# Patient Record
Sex: Female | Born: 1990 | Race: White | Hispanic: No | Marital: Single | Smoking: Never smoker
Health system: Southern US, Community
[De-identification: ages and names within clinical notes are randomized; demographics above are authoritative.]

## PROBLEM LIST (undated history)

## (undated) DIAGNOSIS — G909 Disorder of the autonomic nervous system, unspecified: Secondary | ICD-10-CM

## (undated) DIAGNOSIS — Z9109 Other allergy status, other than to drugs and biological substances: Secondary | ICD-10-CM

## (undated) DIAGNOSIS — E669 Obesity, unspecified: Secondary | ICD-10-CM

## (undated) DIAGNOSIS — H5213 Myopia, bilateral: Secondary | ICD-10-CM

## (undated) DIAGNOSIS — F419 Anxiety disorder, unspecified: Secondary | ICD-10-CM

## (undated) DIAGNOSIS — E559 Vitamin D deficiency, unspecified: Secondary | ICD-10-CM

## (undated) DIAGNOSIS — K219 Gastro-esophageal reflux disease without esophagitis: Secondary | ICD-10-CM

## (undated) DIAGNOSIS — T7840XA Allergy, unspecified, initial encounter: Secondary | ICD-10-CM

## (undated) DIAGNOSIS — F32A Depression, unspecified: Secondary | ICD-10-CM

## (undated) DIAGNOSIS — F329 Major depressive disorder, single episode, unspecified: Secondary | ICD-10-CM

## (undated) DIAGNOSIS — R55 Syncope and collapse: Secondary | ICD-10-CM

## (undated) DIAGNOSIS — F909 Attention-deficit hyperactivity disorder, unspecified type: Secondary | ICD-10-CM

## (undated) DIAGNOSIS — E538 Deficiency of other specified B group vitamins: Secondary | ICD-10-CM

## (undated) HISTORY — DX: Disorder of the autonomic nervous system, unspecified: G90.9

## (undated) HISTORY — DX: Myopia, bilateral: H52.13

## (undated) HISTORY — DX: Gastro-esophageal reflux disease without esophagitis: K21.9

## (undated) HISTORY — DX: Deficiency of other specified B group vitamins: E53.8

## (undated) HISTORY — DX: Allergy, unspecified, initial encounter: T78.40XA

## (undated) HISTORY — DX: Obesity, unspecified: E66.9

## (undated) HISTORY — DX: Vitamin D deficiency, unspecified: E55.9

## (undated) HISTORY — DX: Syncope and collapse: R55

## (undated) HISTORY — DX: Other allergy status, other than to drugs and biological substances: Z91.09

---
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
## 2011-01-01 ENCOUNTER — Ambulatory Visit (INDEPENDENT_AMBULATORY_CARE_PROVIDER_SITE_OTHER): Payer: 59 | Admitting: Marriage and Family Therapist

## 2011-01-01 DIAGNOSIS — F331 Major depressive disorder, recurrent, moderate: Secondary | ICD-10-CM

## 2011-01-01 DIAGNOSIS — F988 Other specified behavioral and emotional disorders with onset usually occurring in childhood and adolescence: Secondary | ICD-10-CM
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
## 2011-01-06 ENCOUNTER — Ambulatory Visit (INDEPENDENT_AMBULATORY_CARE_PROVIDER_SITE_OTHER): Payer: 59 | Admitting: Marriage and Family Therapist

## 2011-01-06 DIAGNOSIS — F331 Major depressive disorder, recurrent, moderate: Secondary | ICD-10-CM

## 2011-01-06 DIAGNOSIS — F988 Other specified behavioral and emotional disorders with onset usually occurring in childhood and adolescence: Secondary | ICD-10-CM
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
## 2011-01-14 ENCOUNTER — Ambulatory Visit (HOSPITAL_COMMUNITY): Payer: 59 | Admitting: Marriage and Family Therapist

## 2011-01-14 DIAGNOSIS — F339 Major depressive disorder, recurrent, unspecified: Secondary | ICD-10-CM

## 2011-01-14 DIAGNOSIS — F988 Other specified behavioral and emotional disorders with onset usually occurring in childhood and adolescence: Secondary | ICD-10-CM

## 2011-01-14 DIAGNOSIS — F900 Attention-deficit hyperactivity disorder, predominantly inattentive type: Secondary | ICD-10-CM

## 2011-01-14 NOTE — Progress Notes (Signed)
   THERAPIST PROGRESS NOTE  Session Time:  Noon - 1:00 p.m.  Participation Level: Active  Behavioral Response: Well GroomedAlertAnxious  Type of Therapy: Individual Therapy  Treatment Goals addressed: Coping  Interventions: Family Systems  Summary: [name] is a 21 y.o. female who presents with depression, ADD, and family conflict. She was referred by Dr. Ladona Ridgel.  Patient reports that she has been falling into another deep depression due to her brother acting out at home and her parents "not doing anything about it."  She is especially angry at her mother for "not following through with being a parent" (her words). Patient reports she has felt suicidal but with no plan e.g., "I can't take it anymore and things are never going to get better so why try."   She also reports doing the DBT workbook right now has lead to surfacing emotions and memories she is "not ready to deal with."  Patient also talked about wanting to leave her family in order to cope and also she will be starting college next week part-time.    Suicidal/Homicidal: Yeswithout intent/plan  Therapist Response:  Decided to hold off on doing DBT work as long as patient's home life remains unstable.  Will consider doing DBT some time in the future when patient is stronger emotionally and possibly out of the house.  Plan: Return again in 1 weeks.  Diagnosis: Axis I: Major Depressive D/O, moderate; ADHD, w/o hyperactivity    Axis II: Deferred    Mary Hafen, LMFT 01/14/2011

## 2011-01-19 ENCOUNTER — Ambulatory Visit (INDEPENDENT_AMBULATORY_CARE_PROVIDER_SITE_OTHER): Payer: 59 | Admitting: Marriage and Family Therapist

## 2011-01-19 DIAGNOSIS — F900 Attention-deficit hyperactivity disorder, predominantly inattentive type: Secondary | ICD-10-CM

## 2011-01-19 DIAGNOSIS — F988 Other specified behavioral and emotional disorders with onset usually occurring in childhood and adolescence: Secondary | ICD-10-CM

## 2011-01-19 DIAGNOSIS — F330 Major depressive disorder, recurrent, mild: Secondary | ICD-10-CM

## 2011-01-19 NOTE — Progress Notes (Signed)
   THERAPIST PROGRESS NOTE  Session Time:  3:00 - 4:00 p.m.  Participation Level: Active  Behavioral Response: Fairly GroomedAlertDepressed  Type of Therapy: Individual Therapy  Treatment Goals addressed: Coping  Interventions: Solution Focused, Strength-based, Assertiveness Training, Supportive and Family Systems  Summary: [name] is a 21 y.o. female who presents with depression, anxiety, and ADD.  She was referred by Jorje Guild, PA. She reports feeling less depressed and was able to recognize that she got her period after last week's session.  Patient started school today and had a good day.  She also talked about her parents "not being able to be there for her" and the idea that she is "really on her own in many respects."    Suicidal/Homicidal: Nowithout intent/plan  Therapist Response:  Discussed an option of patient getting out of the house by being a live-in nanny as an option.  Also used strength-based therapy i.e., discussed opportunity for patient to let go of the fantasy that her parents were capable of parenting her in the way she needs and family systems e.g., parents are not bad people, they are tired, overwhelmed, and have mental health illnesses).  Plan: Return again in 1 weeks.  Diagnosis: Axis I: Major depressive d/o, moderate, ADD    Axis II: Deferred    Jjesus Dingley, LMFT 01/19/2011

## 2011-01-26 ENCOUNTER — Ambulatory Visit (INDEPENDENT_AMBULATORY_CARE_PROVIDER_SITE_OTHER): Payer: 59 | Admitting: Marriage and Family Therapist

## 2011-01-26 DIAGNOSIS — F988 Other specified behavioral and emotional disorders with onset usually occurring in childhood and adolescence: Secondary | ICD-10-CM

## 2011-01-26 DIAGNOSIS — F330 Major depressive disorder, recurrent, mild: Secondary | ICD-10-CM

## 2011-01-26 DIAGNOSIS — F900 Attention-deficit hyperactivity disorder, predominantly inattentive type: Secondary | ICD-10-CM

## 2011-01-26 NOTE — Progress Notes (Signed)
   THERAPIST PROGRESS NOTE  Session Time:  3:00 - 4:00 p.m.  Participation Level: Active  Behavioral Response: Fairly GroomedAlertDepressed  Type of Therapy: Individual Therapy  Treatment Goals addressed: Coping  Interventions: Strength-based, Supportive and Family Systems  Summary: [name] is a 21 y.o. female who presents with depression ADHD (w/o hyperactivity) and chronic family stress.  She was referred by Dr. Ladona Ridgel.  Spent most of the session completing paperwork for reasonable accommodations in college.  Patient did state she has been depressed "on and off" but did not talk about why.  She reports she is struggling with keeping up in math class due to her inability to pay absorb the information in a timely fashion.  Suicidal/Homicidal: Nowithout intent/plan  Therapist Response:   Completed paperwork, and suggested that she talk to her mother about dropping the math class before family losses the tuition.  Plan: Return again in 1 weeks.  Diagnosis: Axis I: Major Depressive D/O, Moderate; ADHD, w/o anxiety    Axis II: Deferred    Tuesday Terlecki, LMFT 01/26/2011

## 2011-01-28 NOTE — Progress Notes (Signed)
Addended by: Cleophas Dunker on: 01/28/2011 03:25 PM   Modules accepted: Level of Service

## 2011-02-02 ENCOUNTER — Ambulatory Visit (INDEPENDENT_AMBULATORY_CARE_PROVIDER_SITE_OTHER): Payer: 59 | Admitting: Marriage and Family Therapist

## 2011-02-02 DIAGNOSIS — F331 Major depressive disorder, recurrent, moderate: Secondary | ICD-10-CM

## 2011-02-02 DIAGNOSIS — F988 Other specified behavioral and emotional disorders with onset usually occurring in childhood and adolescence: Secondary | ICD-10-CM

## 2011-02-02 DIAGNOSIS — F900 Attention-deficit hyperactivity disorder, predominantly inattentive type: Secondary | ICD-10-CM

## 2011-02-02 NOTE — Progress Notes (Signed)
   THERAPIST PROGRESS NOTE  Session Time:  3:00 - 4:00 p.m.  Participation Level: Active  Behavioral Response: Fairly GroomedAlertAnxious, Depressed and Worthless  Type of Therapy: Individual Therapy  Treatment Goals addressed: Coping  Interventions: Strength-based, Supportive and Family Systems  Summary: [name] is a 21 y.o. female who presents with depressive anxiety and ADHD w/o hyperactivity.  (S)he was referred by Dr. Ladona Ridgel. Patient is reporting physical symptoms she believes could be contributing to her depression.  She reports having problems with her period as of the last two weeks are causing mood swings and while out shopping two weekends in a row with no warning patient began having crying spells.  She reports at the time she was not thinking of anything and that shopping with her mother is pleasurable.  Patient continues with school and having no emotional problems.  Suicidal/Homicidal: Nowithout intent/plan  Therapist Response:  Discussed patient's need to talk to Dr. Toni Arthurs about her symptoms even if she needs to write them down for him.  She reports having a difficult time talking to him (states she does not connect emotionally with him even though he know a lot about medication).  Talked about patient asserting herself with Dr. Toni Arthurs to rule out any additional problem and for him to be able to treat her effectively.  Plan: Return again in 1 weeks.  Diagnosis: Axis I: Major Depressive D/O, Moderate; ADHD w/o hyperactivity    Axis II: Deferred    Shaheim Mahar, LMFT 02/02/2011

## 2011-02-09 ENCOUNTER — Ambulatory Visit (HOSPITAL_COMMUNITY): Payer: 59 | Admitting: Marriage and Family Therapist

## 2011-02-17 ENCOUNTER — Ambulatory Visit (INDEPENDENT_AMBULATORY_CARE_PROVIDER_SITE_OTHER): Payer: 59 | Admitting: Marriage and Family Therapist

## 2011-02-17 DIAGNOSIS — F988 Other specified behavioral and emotional disorders with onset usually occurring in childhood and adolescence: Secondary | ICD-10-CM

## 2011-02-17 DIAGNOSIS — F331 Major depressive disorder, recurrent, moderate: Secondary | ICD-10-CM

## 2011-02-17 NOTE — Progress Notes (Signed)
   THERAPIST PROGRESS NOTE  Session Time:  3:00 - 4:00 p.m.  Participation Level: Active  Behavioral Response: CasualAlertAnxious and Depressed  Type of Therapy: Individual Therapy  Treatment Goals addressed: Coping  Interventions: Strength-based, Supportive and Family Systems  Summary: [name] is a 21 y.o. female who presents with depression, anxiety, and ADHD. She was referred by Dr. Ladona Ridgel.  Although she reports that her moods are "up and down."  Patient continues to talk about her home life stating that her brother is "out of control" concerning his behavior and again she says she is having difficulty coping because he has been so volatile.  She also talked about her parents asking her to do errands with the car "when they want and not when it is a good time for me" due to her mental health symptoms (depression/anxiety).  She did report she was enjoying school and that academically with any difficulty that would arise because of ADHD she has been able to manage it due to the type of teaching that has been provided for her.  She also talked about having a lot of time available and is wanting to get a job to have her own money and to help her parents financially.  Patient also started talking about "what it all means" i.e. What is the purpose of her life, something she has never done in therapy with this Clinical research associate.  Suicidal/Homicidal: Nowithout intent/plan  Therapist Response: Discussed with patient what is appropriate family behavior with a 62 year-old including that she is no longer a child and the need for negotiation in order to help her with what is normal.  Discussed patient's position in the family when it comes to what is happening with her brother (has the least control over the situation).  Also encouraged patient to get a job and discussed the benefits of meeting other people (establishing new friendships, having her own money to gain independence).  Discussed patient looking  to the future as a good sign of less depression even though she reports struggling with mood changes.  Plan: Return again in 1 weeks.  Diagnosis: Axis I: Major Depressive Disorder, Moderate; ADHD    Axis II: Deferred    Trek Kimball, LMFT 02/17/2011

## 2011-02-23 ENCOUNTER — Ambulatory Visit (HOSPITAL_COMMUNITY): Payer: 59 | Admitting: Marriage and Family Therapist

## 2011-03-03 ENCOUNTER — Ambulatory Visit (HOSPITAL_COMMUNITY): Payer: 59 | Admitting: Marriage and Family Therapist

## 2011-03-09 ENCOUNTER — Ambulatory Visit (HOSPITAL_COMMUNITY): Payer: 59 | Admitting: Marriage and Family Therapist

## 2011-03-16 ENCOUNTER — Ambulatory Visit (HOSPITAL_COMMUNITY): Payer: Self-pay | Admitting: Marriage and Family Therapist

## 2011-03-23 ENCOUNTER — Ambulatory Visit (HOSPITAL_COMMUNITY): Payer: Self-pay | Admitting: Marriage and Family Therapist

## 2011-03-30 ENCOUNTER — Ambulatory Visit (HOSPITAL_COMMUNITY): Payer: Self-pay | Admitting: Marriage and Family Therapist

## 2011-04-01 ENCOUNTER — Ambulatory Visit (INDEPENDENT_AMBULATORY_CARE_PROVIDER_SITE_OTHER): Payer: 59 | Admitting: Marriage and Family Therapist

## 2011-04-01 DIAGNOSIS — F988 Other specified behavioral and emotional disorders with onset usually occurring in childhood and adolescence: Secondary | ICD-10-CM

## 2011-04-01 DIAGNOSIS — F331 Major depressive disorder, recurrent, moderate: Secondary | ICD-10-CM

## 2011-04-01 NOTE — Progress Notes (Signed)
   THERAPIST PROGRESS NOTE  Session Time:  3:00 - 4:00 P.M.  Participation Level: Active  Behavioral Response: CasualAlertAnxious and Depressed  Type of Therapy: Individual Therapy  Treatment Goals addressed: Coping  Interventions: Strength-based, Supportive and Family Systems  Summary: [name] is a 21 y.o. female who presents with depression, anxiety, ADHD, and family problems.  She was referred by Dr. Ladona Ridgel.  Patient reports she is actually doing well regarding her mental health symptoms and she reports over the past few weeks things have not been that stressful at home.  She reports her father has not been spending much time at home, that he has been spending more time at the church.  She actually states, "he is abandoning the family for the church" however because he has not been there the conflict at home has been less but that it has put more stress on her mother and that patient has been taking care of her younger brother.  Patient did state she bought a bike and that she has been fixing it up to travel back for forth to school.  She also reports she has A's in her classes and has been thinking of going full-time in the Fall, that she is ready to go full-time and ready emotionally to move back to school. Overall patient states she is feeling good.  Suicidal/Homicidal: Nowithout intent/plan  Therapist Response:  Since patient has not been in for some time took the time to assess patient's mental state and patient's depression has improved.  However, since it has only been about a month and patient's home life has never been stable  and this is unusual will continue to see her weekly.  Plan: Return again in 1 weeks.  Diagnosis: Axis I: Major Depressive D/O, Moderate, ADHD    Axis II: Deferred    Zuriel Roskos, LMFT 04/01/2011

## 2011-04-06 ENCOUNTER — Ambulatory Visit (HOSPITAL_COMMUNITY): Payer: Self-pay | Admitting: Marriage and Family Therapist

## 2011-04-13 ENCOUNTER — Encounter (HOSPITAL_COMMUNITY): Payer: Self-pay | Admitting: *Deleted

## 2011-04-13 ENCOUNTER — Emergency Department (INDEPENDENT_AMBULATORY_CARE_PROVIDER_SITE_OTHER)
Admission: EM | Admit: 2011-04-13 | Discharge: 2011-04-13 | Disposition: A | Discharge status: Home / Self Care | Payer: 59 | Source: Home / Self Care | Attending: Emergency Medicine | Admitting: Emergency Medicine

## 2011-04-13 DIAGNOSIS — J029 Acute pharyngitis, unspecified: Secondary | ICD-10-CM

## 2011-04-13 HISTORY — DX: Anxiety disorder, unspecified: F41.9

## 2011-04-13 HISTORY — DX: Depression, unspecified: F32.A

## 2011-04-13 HISTORY — DX: Attention-deficit hyperactivity disorder, unspecified type: F90.9

## 2011-04-13 HISTORY — DX: Major depressive disorder, single episode, unspecified: F32.9

## 2011-04-13 LAB — POCT RAPID STREP A: Streptococcus, Group A Screen (Direct): NEGATIVE

## 2011-04-13 MED ORDER — ACETAMINOPHEN 500 MG PO TABS: 500.0000 mg | ORAL_TABLET | Freq: Four times a day (QID) | ORAL | Status: AC | PRN

## 2011-04-13 NOTE — Discharge Instructions (Signed)
  As discussed consider the possibility of excessive dryness try to hydrate yourself well today with above 2 L of fluids during the course of 24 hours. Use a humidifier tonight and discontinue any over-the-counter medicines including antihistamines that you're taking.    Sore Throat Sore throats may be caused by bacteria and viruses. They may also be caused by:  Smoking.   Pollution.   Allergies.  If a sore throat is due to strep infection (a bacterial infection), you may need:  A throat swab.   A culture test to verify the strep infection.  You will need one of these:  An antibiotic shot.   Oral medicine for a full 10 days.  Strep infection is very contagious. A doctor should check any close contacts who have a sore throat or fever. A sore throat caused by a virus infection will usually last only 3-4 days. Antibiotics will not treat a viral sore throat.  Infectious mononucleosis (a viral disease), however, can cause a sore throat that lasts for up to 3 weeks. Mononucleosis can be diagnosed with blood tests. You must have been sick for at least 1 week in order for the test to give accurate results. HOME CARE INSTRUCTIONS   To treat a sore throat, take mild pain medicine.   Increase your fluids.   Eat a soft diet.   Do not smoke.   Gargling with warm water or salt water (1 tsp. salt in 8 oz. water) can be helpful.   Try throat sprays or lozenges or sucking on hard candy to ease the symptoms.  Call your doctor if your sore throat lasts longer than 1 week.  SEEK IMMEDIATE MEDICAL CARE IF:  You have difficulty breathing.   You have increased swelling in the throat.   You have pain so severe that you are unable to swallow fluids or your saliva.   You have a severe headache, a high fever, vomiting, or a red rash.  Document Released: 02/05/2004 Document Revised: 12/17/2010 Document Reviewed: 12/15/2006 [facility] Patient Information 2012 [place], [place].

## 2011-04-13 NOTE — ED Notes (Signed)
Pt states she started with a cough 3-4 days ago and now has a "severe" sore throat.  States "I can't even swallow water".  Throat is not red or swollen.  Keeps stating she feels "so tired".  Denies fever or ear pain.  States she has a little nasal congestion.  Also states it feels "like a giant lump is in my throat".

## 2011-04-13 NOTE — ED Provider Notes (Signed)
History     CSN: [id number]  Arrival date & time 04/13/11  4098   First MD Initiated Contact with Patient 04/13/11 [phone]      Chief Complaint  Patient presents with  . Sore Throat    (Consider location/radiation/quality/duration/timing/severity/associated sxs/prior treatment) HPI Comments: Started happening after he started taking some antihistamines have a severe sore throat it hurts swallowing water mild cough with no congestion sore fevers.  Patient is a 21 y.o. female presenting with pharyngitis. The history is provided by the patient.  Sore Throat This is a new problem. The current episode started more than 2 days ago. The problem occurs constantly. The problem has been gradually worsening. Pertinent negatives include no chest pain, no abdominal pain and no shortness of breath. The symptoms are aggravated by swallowing. She has tried nothing for the symptoms. The treatment provided no relief.    Past Medical History  Diagnosis Date  . Depression   . Anxiety   . ADHD (attention deficit hyperactivity disorder)     History reviewed. No pertinent past surgical history.  No family history on file.  History  Substance Use Topics  . Smoking status: Never Smoker   . Smokeless tobacco: Not on file  . Alcohol Use: No    OB History    Grav Para Term Preterm Abortions TAB SAB Ect Mult Living                  Review of Systems  Constitutional: Positive for fatigue. Negative for fever, chills, activity change and appetite change.  HENT: Positive for sore throat. Negative for congestion, rhinorrhea, trouble swallowing and neck stiffness.   Respiratory: Negative for cough and shortness of breath.   Cardiovascular: Negative for chest pain.  Gastrointestinal: Negative for abdominal pain.  Skin: Negative for rash.    Allergies  Review of patient's allergies indicates not on file.  Home Medications   Current Outpatient Rx  Name Route Sig Dispense Refill  . LAMOTRIGINE 100  MG PO TABS Oral Take 100 mg by mouth daily.    Marland Kitchen LISDEXAMFETAMINE DIMESYLATE 70 MG PO CAPS Oral Take 70 mg by mouth every morning.    Marland Kitchen MIRTAZAPINE 30 MG PO TABS Oral Take 30 mg by mouth at bedtime.    Marland Kitchen MONONESSA PO Oral Take by mouth.    . SERTRALINE HCL 100 MG PO TABS Oral Take 100 mg by mouth daily.      BP 127/83  Pulse 93  Temp(Src) 98 F (36.7 C) (Oral)  Resp 18  SpO2 98%  Physical Exam  Nursing note and vitals reviewed. Constitutional: She appears well-developed and well-nourished.  Non-toxic appearance. She does not have a sickly appearance. She does not appear ill. No distress.  Eyes: Conjunctivae are normal.  Neck: Normal range of motion. Neck supple.  Cardiovascular: Normal rate.   Pulmonary/Chest: Effort normal and breath sounds normal.  Lymphadenopathy:    She has no cervical adenopathy.  Skin: No rash noted.    ED Course  Procedures (including critical care time)   Labs Reviewed  POCT RAPID STREP A ([facility] URG CARE ONLY)   No results found.   No diagnosis found.    MDM  Sore throat for 3 days with cough. Exam was unremarkable: No erythema, no swelling, no fevers. Decided to screen patient for strep to determine if she is a carrier more than considering active streptococcal pharyngitis. Encouraged patient to manage symptoms with Tylenol or Motrin  Jimmie Molly, MD 04/13/11 [phone]

## 2011-04-15 ENCOUNTER — Ambulatory Visit (HOSPITAL_COMMUNITY): Payer: Self-pay | Admitting: Marriage and Family Therapist

## 2011-04-22 ENCOUNTER — Ambulatory Visit (INDEPENDENT_AMBULATORY_CARE_PROVIDER_SITE_OTHER): Payer: 59 | Admitting: Marriage and Family Therapist

## 2011-04-22 DIAGNOSIS — F331 Major depressive disorder, recurrent, moderate: Secondary | ICD-10-CM

## 2011-04-22 DIAGNOSIS — F988 Other specified behavioral and emotional disorders with onset usually occurring in childhood and adolescence: Secondary | ICD-10-CM

## 2011-04-22 NOTE — Progress Notes (Signed)
   THERAPIST PROGRESS NOTE  Session Time: 3:00-4:00 p.m.  Participation Level: Active  Behavioral Response: CasualAlertDepressed  Type of Therapy: Individual Therapy  Treatment Goals addressed: Coping  Interventions: Strength-based, Supportive and Family Systems  Summary: Rebekka Lobello is a 21 y.o. female who presents with depression and family/school stress.  She was referred by Dr. Ladona Ridgel.  Patient was for the first time in long time calm and happy.  She reports she is mainly focused on getting her projects done in her classes at this time.  She reports that home life has been calm and there was not much to report regarding any situations where she was experiencing stress.    Suicidal/Homicidal: Nowithout intent/plan  Therapist Response:  Discussed with patient the idea that she could consider focusing on living her life in a "non-crisis" manner by reframing the way she thinks to point out to patient that there is another way of thinking (patient tends to be crisis oriented).  Discussed patient's future re school and beginning college full-time since she is doing so well in school.  Plan: Return again in 1 weeks.  Diagnosis: Axis I: Major Depressive D/O, Moderate, ADHD, w/o hyperactivity    Axis II: Deferred    Elai Vanwyk, LMFT 04/22/2011

## 2011-05-20 ENCOUNTER — Ambulatory Visit (INDEPENDENT_AMBULATORY_CARE_PROVIDER_SITE_OTHER): Payer: 59 | Admitting: Marriage and Family Therapist

## 2011-05-20 DIAGNOSIS — F331 Major depressive disorder, recurrent, moderate: Secondary | ICD-10-CM

## 2011-05-20 NOTE — Progress Notes (Signed)
   THERAPIST PROGRESS NOTE  Session Time:  3:00 - 4:00 p.m.  Participation Level: Active  Behavioral Response: CasualAlertDepressed  Type of Therapy: Individual Therapy  Treatment Goals addressed: Coping  Interventions: Strength-based, Assertiveness Training, Supportive and Family Systems  Summary: [name] is a 21 y.o. female who presents with depression, ADD, and family chaos.  She was referred by Dr. Ladona Ridgel.  Patient reports she completed school with all A's.  She reports believing that she will be able to attend [employer] in the Fall.  Patient states that she and her family went out to dinner to celebrate her achievement and her father got angry "over bad bread," left the restaurant, then her mother followed, leaving patient and her brother in the restaurant to finish dinner alone.  Patient stated she cried in the restaurant.  She reports that the family will be going to [place] in June to see her grandparents and she is looking forward to seeing them because they are ill and "I may not get to see them again."  She is not taking Summer classes because of the trip.  Patient states that she is "doing okay."   Suicidal/Homicidal: Nowithout intent/plan  Therapist Response:  Supported and discussed with patient again regarding what is healthy family behavior and moving forward with college.   Plan: Return again in 1 weeks.  Diagnosis: Axis I: Major depressive disorder; ADHD    Axis II: Deferred    Cruz Devilla, LMFT 05/20/2011

## 2011-05-25 ENCOUNTER — Ambulatory Visit (INDEPENDENT_AMBULATORY_CARE_PROVIDER_SITE_OTHER): Payer: 59 | Admitting: Marriage and Family Therapist

## 2011-05-25 DIAGNOSIS — F331 Major depressive disorder, recurrent, moderate: Secondary | ICD-10-CM

## 2011-05-25 DIAGNOSIS — F988 Other specified behavioral and emotional disorders with onset usually occurring in childhood and adolescence: Secondary | ICD-10-CM

## 2011-05-25 NOTE — Progress Notes (Signed)
   THERAPIST PROGRESS NOTE  Session Time:  3:00 - 4:00 p.m.  Participation Level: Active  Behavioral Response: CasualAlertDepressed  Type of Therapy: Individual Therapy  Treatment Goals addressed: Coping  Interventions: Strength-based, Assertiveness Training, Supportive and Family Systems  Summary: [name] is a 21 y.o. female who presents with major depression and ADD.  She was referred by Dr. Ladona Ridgel.  Patient reports that she and her family are going to her grandparent's house in two weeks.  She talked about never wanting to be in a relationship or have children based on how she views relationships due to her parents' relationship and "the behavior I see in men" (that they can get violent - her words).  She reports she wants to focus on succeeding professionally rather than letting relationships get in the way.  Suicidal/Homicidal: Nowithout intent/plan  Therapist Response:  This is the first time patient has spoken about the future in the context of relationships and being successful professionally and how to balance both.  Plan: Return again in 1 weeks.  Diagnosis: Axis I: Major Depressive D/O; ADHD    Axis II: Deferred    Mackensie Pilson, LMFT 05/25/2011

## 2011-06-01 ENCOUNTER — Ambulatory Visit (INDEPENDENT_AMBULATORY_CARE_PROVIDER_SITE_OTHER): Payer: 59 | Admitting: Marriage and Family Therapist

## 2011-06-01 DIAGNOSIS — F331 Major depressive disorder, recurrent, moderate: Secondary | ICD-10-CM

## 2011-06-01 DIAGNOSIS — F988 Other specified behavioral and emotional disorders with onset usually occurring in childhood and adolescence: Secondary | ICD-10-CM
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
## 2011-06-08 ENCOUNTER — Ambulatory Visit (HOSPITAL_COMMUNITY): Payer: Self-pay | Admitting: Marriage and Family Therapist

## 2011-06-15 ENCOUNTER — Ambulatory Visit (HOSPITAL_COMMUNITY): Payer: Self-pay | Admitting: Marriage and Family Therapist

## 2011-06-15 NOTE — Progress Notes (Unsigned)
   THERAPIST PROGRESS NOTE  Session Time:  3:00 - 4:00 p.m.  Participation Level: Patient did not show for her appointment.  Left a message on her voicemail.  Behavioral Response:   Type of Therapy: Individual Therapy  Treatment Goals addressed:   Interventions:   Summary: [name] is a 21 y.o. female who presents with depression and ADD.  She was referred by Dr. Ladona Ridgel.  Patient did not show for her appointment.  Suicidal/Homicidal:   Therapist Response:   Plan: Return again in 1 weeks.  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

## 2011-06-22 ENCOUNTER — Ambulatory Visit (HOSPITAL_COMMUNITY): Payer: Self-pay | Admitting: Marriage and Family Therapist

## 2011-06-29 ENCOUNTER — Ambulatory Visit (HOSPITAL_COMMUNITY): Payer: Self-pay | Admitting: Marriage and Family Therapist

## 2011-07-06 ENCOUNTER — Ambulatory Visit (HOSPITAL_COMMUNITY): Payer: Self-pay | Admitting: Marriage and Family Therapist

## 2011-07-06 NOTE — Progress Notes (Unsigned)
   THERAPIST PROGRESS NOTE  Session Time:  3:00 - 4:00 p.m.  Participation Level: None - patient did not show for her appointment.  Behavioral Response: N/S  Type of Therapy: Individual Therapy  Treatment Goals addressed: None - patient did not show for appointment  Interventions:   Summary: [name] is a 21 y.o. female who presents with depression and ADD.  She was referred by Dr. Ladona Ridgel.     Suicidal/Homicidal:   Therapist Response: Called and left message for patient.  Patient will be charged for this appointment.  A letter will be sent.  Plan: Return again in 1 weeks.  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

## 2011-07-07 ENCOUNTER — Encounter (HOSPITAL_COMMUNITY): Payer: Self-pay

## 2011-07-08 ENCOUNTER — Telehealth (HOSPITAL_COMMUNITY): Payer: Self-pay | Admitting: Marriage and Family Therapist

## 2011-07-13 ENCOUNTER — Ambulatory Visit (INDEPENDENT_AMBULATORY_CARE_PROVIDER_SITE_OTHER): Payer: 59 | Admitting: Marriage and Family Therapist

## 2011-07-13 DIAGNOSIS — F988 Other specified behavioral and emotional disorders with onset usually occurring in childhood and adolescence: Secondary | ICD-10-CM

## 2011-07-13 DIAGNOSIS — F331 Major depressive disorder, recurrent, moderate: Secondary | ICD-10-CM
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
## 2011-07-20 ENCOUNTER — Ambulatory Visit (INDEPENDENT_AMBULATORY_CARE_PROVIDER_SITE_OTHER): Payer: 59 | Admitting: Marriage and Family Therapist

## 2011-07-20 DIAGNOSIS — F988 Other specified behavioral and emotional disorders with onset usually occurring in childhood and adolescence: Secondary | ICD-10-CM

## 2011-07-20 DIAGNOSIS — F331 Major depressive disorder, recurrent, moderate: Secondary | ICD-10-CM

## 2011-07-20 NOTE — Progress Notes (Signed)
   THERAPIST PROGRESS NOTE  Session Time:  3:00 - 4:00 p.m.  Participation Level: Active  Behavioral Response: CasualAlertDepressed  Type of Therapy: Individual Therapy  Treatment Goals addressed: Coping  Interventions: Strength-based and Supportive  Summary: [name] is a 21 y.o. female who presents with depression and ADD.  She was referred by Dr. Ladona Ridgel.   Patient reports she is set up to start college full-time in the Fall at [employer].  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  She reports she is "tired of her life being on hold" and wants to move on and move out of her parents' home.  Patient reports she has gained a substantial amount of weight being home "doing nothing" from 115 lbs to 180 lbs.  She reports she is going to the gym now swimming to try to lose the weight.  She is concerned that her medications may also be causing weight gain.  Patient currently is on Remeron, Zoloft, Lamictal, birth control pills, Vivance and Lorazepam (as needed).  Patient states she believes she will be on medication the rest of her life due to family history and her own "chemistry."  Discussed what happiness means to patient since she was talking about her older brother leaving home, going to chiropractic school and getting married.  Suicidal/Homicidal: Nowithout intent/plan  Therapist Response:  Reviewed patient's plans to go back to school to see if she had a plan and to ask her if she needed assistance relating to any disability requirements.  Also reviewed patient's medication list.  Asked patient if she has ever been happy to identify if patient could draw from moments of happiness to help alleviate her depression and have more hope about her future.  Patient reported she had moments of happiness, could not identify the circumstances, but stated those moments were fleeting and that she did not expect to be happy.  Will continue to address patient's plans for the future  encouraging her to continue to focus on same.  Plan: Return again in 1 weeks.  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

## 2011-08-03 ENCOUNTER — Ambulatory Visit (INDEPENDENT_AMBULATORY_CARE_PROVIDER_SITE_OTHER): Payer: 59 | Admitting: Marriage and Family Therapist

## 2011-08-03 DIAGNOSIS — F331 Major depressive disorder, recurrent, moderate: Secondary | ICD-10-CM

## 2011-08-03 DIAGNOSIS — F988 Other specified behavioral and emotional disorders with onset usually occurring in childhood and adolescence: Secondary | ICD-10-CM

## 2011-08-03 NOTE — Progress Notes (Signed)
   THERAPIST PROGRESS NOTE  Session Time:  3:00 - 4:00 p.m.  Participation Level: Active  Behavioral Response: CasualAlertDepressed  Type of Therapy: Individual Therapy  Treatment Goals addressed: Coping  Interventions: Biofeedback, Supportive and Family Systems  Summary: [name] is a 21 y.o. female who presents with depression and ADHD without hyperactivity.  She was referred by Dr. Ladona Ridgel.  Patient reports the past two weeks have been better. She was smiling and upbeat during the session. She reports spending time with her father picking blueberries and going to the movies.  Patient talked about spending time with her father. She reports being prepared to go to school although she is still working on her Therapist, sports.  She reports that eventually she does want to stay at school and move out of her house because "it is time for me to grow up."  She reports having some depression over the past two weeks but believes it is due to school financial difficulties and the fact that she has not been able to spend time with mother window shopping.  Patient states that doing this activity is a strong mental health technique to help alleviate her depression and when she is unable to do it as part of her ritual she notices a dip in her depression.    Suicidal/Homicidal: Nowithout intent/plan  Therapist Response:  Used strength-based therapy to help patient notice that her father can be a good father but that he has a mental health condition versus believing that he is a bad father.  Also discussed and gave patient credit for developing techniques she uses to decrease her depression including window shopping and having soothing materials in her bedroom, something she has done since she was a child.  Plan: Return again in 1 weeks.  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

## 2011-08-10 ENCOUNTER — Ambulatory Visit (INDEPENDENT_AMBULATORY_CARE_PROVIDER_SITE_OTHER): Payer: 59 | Admitting: Marriage and Family Therapist

## 2011-08-10 DIAGNOSIS — F331 Major depressive disorder, recurrent, moderate: Secondary | ICD-10-CM

## 2011-08-10 DIAGNOSIS — F988 Other specified behavioral and emotional disorders with onset usually occurring in childhood and adolescence: Secondary | ICD-10-CM
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
## 2011-08-18 ENCOUNTER — Ambulatory Visit (INDEPENDENT_AMBULATORY_CARE_PROVIDER_SITE_OTHER): Payer: 59 | Admitting: Marriage and Family Therapist

## 2011-08-18 DIAGNOSIS — F331 Major depressive disorder, recurrent, moderate: Secondary | ICD-10-CM

## 2011-08-18 DIAGNOSIS — F988 Other specified behavioral and emotional disorders with onset usually occurring in childhood and adolescence: Secondary | ICD-10-CM
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
## 2011-08-25 ENCOUNTER — Ambulatory Visit (INDEPENDENT_AMBULATORY_CARE_PROVIDER_SITE_OTHER): Payer: 59 | Admitting: Marriage and Family Therapist

## 2011-08-25 DIAGNOSIS — F988 Other specified behavioral and emotional disorders with onset usually occurring in childhood and adolescence: Secondary | ICD-10-CM

## 2011-08-25 DIAGNOSIS — F331 Major depressive disorder, recurrent, moderate: Secondary | ICD-10-CM

## 2011-08-25 NOTE — Progress Notes (Signed)
   THERAPIST PROGRESS NOTE  Session Time:  3:00 - 4:00 p.m.  Participation Level: Active  Behavioral Response: CasualAlertAnxious  Type of Therapy: Individual Therapy  Treatment Goals addressed: Coping  Interventions: Strength-based, Assertiveness Training, Supportive and Family Systems  Summary: [name] is a 21 y.o. female who presents with depression and ADHD without hyperactivity.  Patient will be starting full-time college on 08/30/11.  She reports this is what she is focused on and is trying to get through the next few days and not get overwhelmed with anxiety and depression before she starts school.  She reports she is tired because she has been on a "awake at night and sleeping during the day" for months and is trying to switch her sleeping schedule.  She reports her parents have been "on her case" about getting her school books, etc.     Suicidal/Homicidal: Nowithout intent/plan  Therapist Response:  Encouraged patient that she was right to focus on school at this time and not to get defocused by others.  Also talked about why her parents are "getting on her case" and patient was able to admit that she "had been lazy."  Also processed the idea that her parents may be anxious because of fear that she will not be able to go full time based on what happened last year at [employer].  Also talked about how to proceed with therapy scheduling and decided to continue weekly appointments and if the appointments become too much will cut down to every other week due to her school course load.  Plan: Return again in 1 weeks.  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

## 2011-08-31 ENCOUNTER — Ambulatory Visit (INDEPENDENT_AMBULATORY_CARE_PROVIDER_SITE_OTHER): Payer: 59 | Admitting: Marriage and Family Therapist

## 2011-08-31 DIAGNOSIS — F331 Major depressive disorder, recurrent, moderate: Secondary | ICD-10-CM

## 2011-08-31 DIAGNOSIS — F988 Other specified behavioral and emotional disorders with onset usually occurring in childhood and adolescence: Secondary | ICD-10-CM
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
## 2011-09-09 ENCOUNTER — Ambulatory Visit (INDEPENDENT_AMBULATORY_CARE_PROVIDER_SITE_OTHER): Payer: 59 | Admitting: Marriage and Family Therapist

## 2011-09-09 DIAGNOSIS — F331 Major depressive disorder, recurrent, moderate: Secondary | ICD-10-CM

## 2011-09-09 DIAGNOSIS — F988 Other specified behavioral and emotional disorders with onset usually occurring in childhood and adolescence: Secondary | ICD-10-CM

## 2011-09-09 NOTE — Progress Notes (Signed)
   THERAPIST PROGRESS NOTE  Session Time:  3:00 - 4:00 p.m.  Participation Level: Active  Behavioral Response: CasualAlertAnxious  Type of Therapy: Individual Therapy  Treatment Goals addressed: Coping  Interventions: Strength-based and Supportive  Summary: [name] is a 21 y.o. 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  She was referred by Dr. Ladona Ridgel.  Patient reports she started school and is "very overwhelmed."  She reports also that due to her not having a schedule and sleeping during the day and being up all night for almost a year she is having difficulty with sleep.  She talked about the classes and some of her classes are not structured in a way that help her (due to her ADHD problems). Patient also reports that four days ago she found out her grandmother (mother's mother) has cardiac pulmonary edema.  She reports remembering last year when her father's mother died in the beginning of her first year at [employer] which was part of why she had so much difficulty the first year.  Suicidal/Homicidal: Nowithout intent/plan  Therapist Response:  Discussed having a meeting with the disabilities department so patient can talk about ways she can be accommodated in the classes that are less structured.  Commended patient that although she is tired and overwhelmed she is not "emotionally frozen" as she was last years.  Discussed the alternative to her quitting would be that she would live at home the rest of her life.  Patient did not like this comment.  Patient will be obtaining software that will help her record information in class and will talk to the disabilities department about alternatives in learning.    Plan: Return again in  1 weeks.  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

## 2011-09-21 ENCOUNTER — Ambulatory Visit (INDEPENDENT_AMBULATORY_CARE_PROVIDER_SITE_OTHER): Payer: 59 | Admitting: Marriage and Family Therapist

## 2011-09-21 DIAGNOSIS — F988 Other specified behavioral and emotional disorders with onset usually occurring in childhood and adolescence: Secondary | ICD-10-CM

## 2011-09-21 DIAGNOSIS — F331 Major depressive disorder, recurrent, moderate: Secondary | ICD-10-CM

## 2011-09-21 NOTE — Progress Notes (Signed)
   THERAPIST PROGRESS NOTE  Session Time:  3:00 - 4:00 p.m.  Participation Level: Active  Behavioral Response: CasualAlertDepressedOverwhelmed  Type of Therapy: Individual Therapy  Treatment Goals addressed: Coping  Interventions: Strength-based and Supportive  Summary: [name] is a 21 y.o. female who presents with depression and ADHD.  She was referred by Dr. Ladona Ridgel.  Patient reports things at home have been calmer.  She reports her brother is happier and not picking on her because he seems to be happy going to [place].  She reports spending time with him reinforcing good behavior by taking him to lunch.  She did state however that she believes she is depressed having to do with school.  She reports feeling overwhelmed, that she is having difficulty assimilating information enough to do her homework.  She reports doing no homework since starting two weeks ago.  She reports feeling exhausted as a result as well.  Patient states she is afraid she is going to end up as she was in [employer].  Suicidal/Homicidal: Nowithout intent/plan  Therapist Response:  Encouraged patient to do school work by coming up with a planned schedule that would include balancing out eating well and getting enough sleep.  Discussed the need for her to "get it together" asap because she will again have to pay back school loans if she fails since the loans are now in her name.  Discussed patient's pattern of coping in a negative way when she gets overwhelmed.  Also suggested she talk to Dr. Toni Arthurs about her medication based on how she is having difficulty thinking but patient stated she believes medication will not help her.  Plan: Return again in 1 weeks.  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

## 2011-09-30 ENCOUNTER — Ambulatory Visit (INDEPENDENT_AMBULATORY_CARE_PROVIDER_SITE_OTHER): Payer: 59 | Admitting: Marriage and Family Therapist

## 2011-09-30 DIAGNOSIS — F988 Other specified behavioral and emotional disorders with onset usually occurring in childhood and adolescence: Secondary | ICD-10-CM

## 2011-09-30 DIAGNOSIS — F331 Major depressive disorder, recurrent, moderate: Secondary | ICD-10-CM

## 2011-09-30 NOTE — Progress Notes (Signed)
   THERAPIST PROGRESS NOTE  Session Time:  3:00 - 4:00 p.m.  Participation Level: Active  Behavioral Response: CasualAlertDepressed  Type of Therapy: Individual Therapy  Treatment Goals addressed: Coping  Interventions: Strength-based, Assertiveness Training, Supportive and Family Systems  Summary: [name] is a 21 y.o. 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  Patient was referred by Dr. Ladona Ridgel.  Patient reports she dropped one of her classes due to being so overwhelmed.  She reports her mother was supportive and told her she was an adult and could make the decision on her own.  Patient reports this made her feel good.  Patient reports despite dropping a class she continues to have difficulty concentrating "the most severe I've had," and started crying.  She reports she wants to go to college and succeed "very badly," but is afraid she is not going to be able to complete college and cannot imagine her doing anything else.  She also reported her father has been berating her for dropping the class but also for having the car at school because he needs it.  She reports she needs to spend her time in school in class and studying because it is too difficult for her to study at home.  Patient states, "I don't know what is the matter with me."  She also reports she does not have a rapport with her psychiatrist and "only goes to him for prescription refills."  She also reports she believes her ADHD medications have not been working for a long time.  She reports she has thought about getting another psychiatrist for a long time.   Suicidal/Homicidal: Nowithout intent/plan  Therapist Response:  Patient is very motivated to get through college but appeared to be having a great deal of difficulty focusing.  She is also getting more and more depressed over school.  Discussed rapport with a doctor not being as important as believing you are taking the right medications.  Suggested she  talk to her mother about how she feels about her psychiatrist and her desire to change and patient stated she would talk to mother.  Will discuss next week.  Plan: Return again in 1 weeks.  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

## 2011-10-05 ENCOUNTER — Ambulatory Visit (INDEPENDENT_AMBULATORY_CARE_PROVIDER_SITE_OTHER): Payer: 59 | Admitting: Marriage and Family Therapist

## 2011-10-05 DIAGNOSIS — F331 Major depressive disorder, recurrent, moderate: Secondary | ICD-10-CM

## 2011-10-05 DIAGNOSIS — F988 Other specified behavioral and emotional disorders with onset usually occurring in childhood and adolescence: Secondary | ICD-10-CM
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
## 2011-10-12 ENCOUNTER — Ambulatory Visit (HOSPITAL_COMMUNITY): Payer: Self-pay | Admitting: Marriage and Family Therapist

## 2011-10-19 ENCOUNTER — Ambulatory Visit (INDEPENDENT_AMBULATORY_CARE_PROVIDER_SITE_OTHER): Payer: 59 | Admitting: Marriage and Family Therapist

## 2011-10-19 DIAGNOSIS — F988 Other specified behavioral and emotional disorders with onset usually occurring in childhood and adolescence: Secondary | ICD-10-CM

## 2011-10-19 DIAGNOSIS — F331 Major depressive disorder, recurrent, moderate: Secondary | ICD-10-CM
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
## 2011-10-26 ENCOUNTER — Ambulatory Visit (INDEPENDENT_AMBULATORY_CARE_PROVIDER_SITE_OTHER): Payer: 59 | Admitting: Marriage and Family Therapist

## 2011-10-26 DIAGNOSIS — F331 Major depressive disorder, recurrent, moderate: Secondary | ICD-10-CM

## 2011-10-26 DIAGNOSIS — F988 Other specified behavioral and emotional disorders with onset usually occurring in childhood and adolescence: Secondary | ICD-10-CM

## 2011-10-27 NOTE — Progress Notes (Signed)
   THERAPIST PROGRESS NOTE  Session Time:  3:00 - 4:00 p.m.  Participation Level: Active  Behavioral Response: CasualAlertDepressed  Type of Therapy: Individual Therapy  Treatment Goals addressed: Coping  Interventions: Strength-based, Supportive and Family Systems  Summary: [name] is a 21 y.o. female who presents with depression and ADHD.  Patient was referred by Dr. Ladona Ridgel.  Patient reports she is noticing more and more how lonely she is.  She reports she has even been avoiding her on-line friends.  She reports at home she does not feel that the focus is on her "most of the time," that the boys in the family are more important, that her father focuses on church and her mother focuses on work and most of the time does not feel well.  Patient states at this time she does not know why she should even try.  She talked about the other students at school and believes "they are all jerks" and she is not interested in getting to know any of them.  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  Suicidal/Homicidal: Nowithout intent/plan  Therapist Response:  Patient appeared sad today based on her body language (appeared tearful) and self-report.  Discussed patient viewing others, especially in school with black and white thinking, causing much of her loneliness and decision not to reach out to others.  Also decided to take most of the session to talk about patient's hopes and dreams.  Explained to patient that even though she is far from obtaining her degree, it would be helpful for her to find ways to keep her passion sparked.  By the end of the session with the discussions about nature and marine biology, patient stated she was feeling a lot better.  The recommendation was that she continue to find others and ways to keep her passion alive while she is in school.  Plan: Return again in 1  weeks.  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

## 2011-11-02 ENCOUNTER — Ambulatory Visit (HOSPITAL_COMMUNITY): Payer: Self-pay | Admitting: Marriage and Family Therapist

## 2011-11-05 ENCOUNTER — Ambulatory Visit (HOSPITAL_COMMUNITY): Payer: Self-pay | Admitting: Marriage and Family Therapist

## 2011-11-09 ENCOUNTER — Ambulatory Visit (INDEPENDENT_AMBULATORY_CARE_PROVIDER_SITE_OTHER): Payer: 59 | Admitting: Marriage and Family Therapist

## 2011-11-09 DIAGNOSIS — F331 Major depressive disorder, recurrent, moderate: Secondary | ICD-10-CM

## 2011-11-09 DIAGNOSIS — F988 Other specified behavioral and emotional disorders with onset usually occurring in childhood and adolescence: Secondary | ICD-10-CM
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
## 2011-11-25 ENCOUNTER — Ambulatory Visit (INDEPENDENT_AMBULATORY_CARE_PROVIDER_SITE_OTHER): Payer: 59 | Admitting: Marriage and Family Therapist

## 2011-11-25 DIAGNOSIS — F331 Major depressive disorder, recurrent, moderate: Secondary | ICD-10-CM

## 2011-11-25 DIAGNOSIS — F988 Other specified behavioral and emotional disorders with onset usually occurring in childhood and adolescence: Secondary | ICD-10-CM

## 2011-11-25 NOTE — Progress Notes (Signed)
   THERAPIST PROGRESS NOTE  Session Time:  1:00 - 2:00 p.m.  Participation Level: Active  Behavioral Response: CasualAlertDepressedMild  Type of Therapy: Individual Therapy  Treatment Goals addressed: Coping  Interventions: Strength-based, Supportive and Family Systems  Summary: [name] is a 21 y.o. female who presents with depression and ADHD.  She was referred by Dr. Ladona Ridgel.  She reports over the past two weeks she has felt less depressed.  She reports it is due to doing so well at school.  Patient's grades are excellent.  She reports she has more hope because she is doing so well but frustrated at school taking so long due to her depression and ADHD.  Patient spent part of the session talking about her plans to go to a school in [place] that specializes in marine biology.  She also reports she and her mother are getting the house ready for her older brother and his fiance and is having a lot of fun fixing the house.  Suicidal/Homicidal: Nowithout intent/plan  Therapist Response:  Encouraged patient to continue to focus on how she is doing in school, and to take that feeling with her into the next semester to help her understand that even if she gets overwhelmed she ends up doing exceptionally well.  Suggested that it will take her about a year of successes to feel more confident taking a full caseload.  The entire session with the exception of what her mother and her are doing in the house was about patient's plans.  Plan: Return again in 1 weeks.  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

## 2011-11-30 ENCOUNTER — Ambulatory Visit (INDEPENDENT_AMBULATORY_CARE_PROVIDER_SITE_OTHER): Payer: 59 | Admitting: Marriage and Family Therapist

## 2011-11-30 DIAGNOSIS — F331 Major depressive disorder, recurrent, moderate: Secondary | ICD-10-CM

## 2011-11-30 DIAGNOSIS — F988 Other specified behavioral and emotional disorders with onset usually occurring in childhood and adolescence: Secondary | ICD-10-CM
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
## 2011-12-07 ENCOUNTER — Ambulatory Visit (INDEPENDENT_AMBULATORY_CARE_PROVIDER_SITE_OTHER): Payer: 59 | Admitting: Marriage and Family Therapist

## 2011-12-07 DIAGNOSIS — F331 Major depressive disorder, recurrent, moderate: Secondary | ICD-10-CM

## 2011-12-07 DIAGNOSIS — F988 Other specified behavioral and emotional disorders with onset usually occurring in childhood and adolescence: Secondary | ICD-10-CM
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
## 2011-12-14 ENCOUNTER — Ambulatory Visit (INDEPENDENT_AMBULATORY_CARE_PROVIDER_SITE_OTHER): Payer: 59 | Admitting: Marriage and Family Therapist

## 2011-12-14 DIAGNOSIS — F988 Other specified behavioral and emotional disorders with onset usually occurring in childhood and adolescence: Secondary | ICD-10-CM

## 2011-12-14 DIAGNOSIS — F331 Major depressive disorder, recurrent, moderate: Secondary | ICD-10-CM

## 2011-12-14 NOTE — Progress Notes (Signed)
   THERAPIST PROGRESS NOTE  Session Time:  3:00 - 4:00 p.m.  Participation Level: Active  Behavioral Response: CasualAlertDepressed (mild)  Type of Therapy: Individual Therapy  Treatment Goals addressed: Coping  Interventions: Strength-based and Supportive  Summary: [name] is a 21 y.o. female who presents with depression and ADHD.  She was referred by Dr. Ladona Ridgel.  Patient reports there is not much going on right now because she is not in school.  She reports feeling some depression centered about applying for next semester's classes.  She reports for some reason she is procrastinating signing up for classes but does not know why.  She blamed it on her ADHD that she is having difficulty focusing.  Patient did say she was thinking about getting a job instead of going to school.  She talked about continuing to work on the house with her family for the visit from her brother and fiance.  She reports this work is taking up a great deal of her time and that she is focused on same.  Suicidal/Homicidal: Nowithout intent/plan  Therapist Response:  Asked patient if she is sabotaging going back to school unconsciously and that this behavior is not like her.  She was not able to identify why she was avoiding signing up for classes even though this semester she did very well.  Overall at this time patient appears to have mild depression evidenced by self-report but also situational having to do with school.  Plan: Return again in 1 weeks.  Diagnosis: Axis I: Major depressive d/o moderate; ADHD without hyperactivity    Axis II: Deferred    Tymeir Weathington, LMFT 12/14/2011

## 2011-12-20 ENCOUNTER — Telehealth (HOSPITAL_COMMUNITY): Payer: Self-pay

## 2011-12-20 NOTE — Telephone Encounter (Signed)
11:10AM 12/20/11 CALLED DUE  PHONE TREE REPORT OPT 2 TO CANCEL - IT WAS AN ERROR./SH

## 2011-12-21 ENCOUNTER — Ambulatory Visit (INDEPENDENT_AMBULATORY_CARE_PROVIDER_SITE_OTHER): Payer: 59 | Admitting: Marriage and Family Therapist

## 2011-12-21 DIAGNOSIS — F988 Other specified behavioral and emotional disorders with onset usually occurring in childhood and adolescence: Secondary | ICD-10-CM

## 2011-12-21 DIAGNOSIS — F331 Major depressive disorder, recurrent, moderate: Secondary | ICD-10-CM

## 2011-12-21 NOTE — Progress Notes (Signed)
   THERAPIST PROGRESS NOTE  Session Time:  3:00 - 4:00 p.m.  Participation Level: Active  Behavioral Response: CasualAlertDepressed (mild)  Type of Therapy: Individual Therapy  Treatment Goals addressed: Coping  Interventions: Strength-based, Supportive and Family Systems  Summary: [name] is a 21 y.o. female who presents with depression and ADHD.  She was referred by Dr. Ladona Ridgel.  Patient reports she was actually feeling less depressed.  She reports this is due to her finishing school, finishing her last exams, and being happy (her words) because she believes she did "very well" on her exams.  Patient was also able to sign up for two classes, chemistry and english.  She reports she is taking english with another teacher contrary to the one she had this semester (that she did not do well with the teacher's "rigidity" and teaching style).  Patient also talked about her father not being around much during the day for a long time because he is so involved in the church.  Patient states she almost did not make her appointment on time due to his being at church.  Patient states she is an atheist because she "got religion rammed down her throat" in early childhood when she went to a [employer] school.  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  Patient reports she is worried about her mother's health (is having bowel problems) due to her mother's stress.  Suicidal/Homicidal: Nowithout intent/plan  Therapist Response:  Commended patient for not signing up for too many classes, something she has done in the past and ended up dropping classes.  Discussed the idea that patient did not have to cope with work by [employer] or "working all the time."  She was able through this discussion realize she is getting better with balancing out classes with down time, "better than when  she was in high school."    Plan: Return again in 1 weeks.  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

## 2011-12-28 ENCOUNTER — Ambulatory Visit (INDEPENDENT_AMBULATORY_CARE_PROVIDER_SITE_OTHER): Payer: 59 | Admitting: Marriage and Family Therapist

## 2011-12-28 DIAGNOSIS — F331 Major depressive disorder, recurrent, moderate: Secondary | ICD-10-CM

## 2011-12-28 DIAGNOSIS — F988 Other specified behavioral and emotional disorders with onset usually occurring in childhood and adolescence: Secondary | ICD-10-CM
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
## 2012-01-11 ENCOUNTER — Ambulatory Visit (HOSPITAL_COMMUNITY): Payer: Self-pay | Admitting: Marriage and Family Therapist

## 2012-01-13 NOTE — Progress Notes (Signed)
   THERAPIST PROGRESS NOTE  Session Time:  3:00 - 4:00 p.m.  Participation Level: Active  Behavioral Response: CasualAlertDepressed (mild); anxiety (mild)  Type of Therapy: Individual Therapy  Treatment Goals addressed: Coping  Interventions: Strength-based and Supportive  Summary: [name] is a 22 y.o. female who presents with depression and ADHD without hyperactivity.  She was referred by Dr. Ladona Ridgel.   Patient reports she her depression is "not bad" since getting out of school.  She reports her brother and his fiance visited for a week during the holidays and she had a good time.  She likes her brother's fiance very much and relates to her.  She also said the family "held it together during the time they were staying with the family.  Patient did say she would be starting school in 1/6 and had some anxiety around starting again, particularly with [place].  Patient also states she is "very fatigued, and is staying up until three in the morning and waking up at noon.  Suicidal/Homicidal: Nowithout intent/plan  Therapist Response:  Focused mainly on school for patient.  Used strength-based therapy to illustrated that, although patient has ADHD which makes school more difficult for her, she always does exceptionally well (patient states she has a 4.0 since being at [employer]).  Also discussed patient being perfectionistic which gets her in trouble regarding anxiety and ability to study.  This Clinical research associate told patient to remember her strength of doing exceptionally well in spite of her hindrances when she is in the middle of studying and patient said she will remember.  Plan: Return again in 1 weeks.  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

## 2012-01-14 ENCOUNTER — Ambulatory Visit (INDEPENDENT_AMBULATORY_CARE_PROVIDER_SITE_OTHER): Payer: 59 | Admitting: Marriage and Family Therapist

## 2012-01-14 DIAGNOSIS — F988 Other specified behavioral and emotional disorders with onset usually occurring in childhood and adolescence: Secondary | ICD-10-CM

## 2012-01-14 DIAGNOSIS — F331 Major depressive disorder, recurrent, moderate: Secondary | ICD-10-CM

## 2012-01-18 NOTE — Progress Notes (Signed)
This encounter was created in error - please disregard.

## 2012-01-25 NOTE — Progress Notes (Signed)
This encounter was created in error - please disregard.

## 2012-01-27 ENCOUNTER — Ambulatory Visit (INDEPENDENT_AMBULATORY_CARE_PROVIDER_SITE_OTHER): Payer: 59 | Admitting: Marriage and Family Therapist

## 2012-01-27 DIAGNOSIS — F331 Major depressive disorder, recurrent, moderate: Secondary | ICD-10-CM

## 2012-01-27 DIAGNOSIS — F909 Attention-deficit hyperactivity disorder, unspecified type: Secondary | ICD-10-CM
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
## 2012-02-01 NOTE — Telephone Encounter (Signed)
No additional documentation necessary

## 2012-02-10 ENCOUNTER — Ambulatory Visit (INDEPENDENT_AMBULATORY_CARE_PROVIDER_SITE_OTHER): Payer: 59 | Admitting: Marriage and Family Therapist

## 2012-02-10 DIAGNOSIS — F331 Major depressive disorder, recurrent, moderate: Secondary | ICD-10-CM

## 2012-02-10 DIAGNOSIS — F988 Other specified behavioral and emotional disorders with onset usually occurring in childhood and adolescence: Secondary | ICD-10-CM
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
## 2012-02-14 ENCOUNTER — Ambulatory Visit (INDEPENDENT_AMBULATORY_CARE_PROVIDER_SITE_OTHER): Payer: 59 | Admitting: Marriage and Family Therapist

## 2012-02-14 DIAGNOSIS — F988 Other specified behavioral and emotional disorders with onset usually occurring in childhood and adolescence: Secondary | ICD-10-CM

## 2012-02-14 DIAGNOSIS — F331 Major depressive disorder, recurrent, moderate: Secondary | ICD-10-CM
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
## 2012-02-15 ENCOUNTER — Ambulatory Visit (HOSPITAL_COMMUNITY): Payer: Self-pay | Admitting: Marriage and Family Therapist

## 2012-02-22 ENCOUNTER — Ambulatory Visit (HOSPITAL_COMMUNITY): Payer: Self-pay | Admitting: Marriage and Family Therapist

## 2012-02-29 ENCOUNTER — Ambulatory Visit (INDEPENDENT_AMBULATORY_CARE_PROVIDER_SITE_OTHER): Payer: 59 | Admitting: Marriage and Family Therapist

## 2012-02-29 DIAGNOSIS — F331 Major depressive disorder, recurrent, moderate: Secondary | ICD-10-CM

## 2012-02-29 DIAGNOSIS — F988 Other specified behavioral and emotional disorders with onset usually occurring in childhood and adolescence: Secondary | ICD-10-CM

## 2012-03-01 NOTE — Progress Notes (Signed)
   THERAPIST PROGRESS NOTE  Session Time:  3:00 - 4:00 p.m.  Participation Level: Active  Behavioral Response: CasualAlertDepressed  Type of Therapy: Individual Therapy  Treatment Goals addressed: Coping  Interventions: Strength-based and Supportive  Summary: [name] is a 22 y.o. 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  She was referred by Dr. Ladona Ridgel.  Patient came in stating she had had a stressful few weeks mainly concerning school.  Patient stated she felt overwhelmed regarding the amount of work she had to do.  She reported continuing to have difficulty in chemistry and decided to talk to her chemistry professor.  She reported he spent an hour with her giving suggestions and listening.  She reports this talk helped her considerably.  Patient also talked about needing to get blood work done (she has a needle phobia) and wanted to know what to do to get over this phobia.  She also talked about her younger brother and the problems he is causing in the family.  Suicidal/Homicidal: Nowithout intent/plan  Therapist Response:  Complimented patient on how she was proactive in talking to her professor, a behavior she would not do in the past.  Discussed patient's past avoiding behavior around school work and patient does agree she needs to focus on not procrastinating.  Talked about patient maturing regarding school responsibilities.  Discussed interventions for patient's phobia.  Discussed this Clinical research associate recommended specialized desensitization which this Clinical research associate does not do.  When patient is ready she may revisit this idea.  Discussed how patient is coping with her brother's acting out behavior but also worked to help her feel some compassion for her brother.  Plan: Return again in 1 weeks.  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

## 2012-03-07 ENCOUNTER — Ambulatory Visit (INDEPENDENT_AMBULATORY_CARE_PROVIDER_SITE_OTHER): Payer: 59 | Admitting: Marriage and Family Therapist

## 2012-03-07 DIAGNOSIS — F988 Other specified behavioral and emotional disorders with onset usually occurring in childhood and adolescence: Secondary | ICD-10-CM

## 2012-03-07 DIAGNOSIS — F331 Major depressive disorder, recurrent, moderate: Secondary | ICD-10-CM

## 2012-03-07 NOTE — Progress Notes (Signed)
   THERAPIST PROGRESS NOTE  Session Time: 3:00 - 4:00 p.m.  Participation Level: Active  Behavioral Response: CasualAlertDepressed (mild)  Type of Therapy: Individual Therapy  Treatment Goals addressed: Coping  Interventions: Strength-based and Supportive  Summary: Kely Dohn is a 22 y.o. 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  Patient was referred by Dr. Ladona Ridgel.  Patient reports her life for the past week has been stable and that there was not much to talk about.  She reports she continues to have stress around school and finishing school work, but while in the session looked up her chemistry grade and received a 92 on her last exam.  Patient states she was surprised.  She also states [employer] has asked her to join based on her grades.  Patient thinks she will join since this may give her opportunity for scholarships. She also states she has an appointment with [facility] to find out about re-registering there.  Patient states she believes she cannot go full-time due to her depression and ADHD severity.   Suicidal/Homicidal: Nowithout intent/plan  Therapist Response:  Discussed with patient how to proceed with school.  Suggested that although she is having difficulty attending school full-time, that she take Summer classes to help her not get too far behind.  Also assessed patient's depression at this time.  Patient admits she feels fatigued, not feeling like doing much.  Discussed this possibly being a low-level depression since patient states she believes she is depressed though she does not show the classic symptoms.  Will continue to support patient through classes.  Discussed how patient is doing keeping up with homework and studying and she admits not procrastinating has somewhat improved but she wants to procrastinate.  Continue to discourage patient procrastinating.   Plan: Return again in 1 weeks.  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

## 2012-03-14 ENCOUNTER — Ambulatory Visit (INDEPENDENT_AMBULATORY_CARE_PROVIDER_SITE_OTHER): Payer: 59 | Admitting: Marriage and Family Therapist

## 2012-03-14 DIAGNOSIS — F988 Other specified behavioral and emotional disorders with onset usually occurring in childhood and adolescence: Secondary | ICD-10-CM

## 2012-03-14 DIAGNOSIS — F331 Major depressive disorder, recurrent, moderate: Secondary | ICD-10-CM

## 2012-03-15 NOTE — Progress Notes (Signed)
   THERAPIST PROGRESS NOTE  Session Time: 3:00 - 4:00 PM  Participation Level: Active  Behavioral Response: CasualAlertDepressed (Mild)  Type of Therapy: Individual Therapy  Treatment Goals addressed: Coping  Interventions: Strength-based and Supportive  Summary: Kassadee Carawan is a 22 y.o. female who presents with depression and ADHD.  She was referred by Dr. Ladona Ridgel.  Patient reports she has been feeing better this week for a couple of reasons.  She reports she is on Spring break.  She also reports her brother has been treating her better, that they have been playing video games together and this has been good for both of them.  She also talked about school work and that she is "finally getting in the swing of things."  She also talked about wanting to attend [employer] next year rather than continue at [employer].  She has an appointment with them in April to discuss same.     Suicidal/Homicidal: Nowithout intent/plan  Therapist Response:  Mainly focused on patient not procrastinating with her school work since she is on Spring break.  Discussed several options including how to break her school work down into chunks so she does not get overwhelmed and in order for her to form a habit.  Discussed patient doing "all or nothing" behavior around school work leaving her waiting until the last minute.  Assessed patient's depression and with her report and body language she appears to have mild depression.  This is especially so since some of her biggest stressors are not happening at the moment.  Plan: Return again in 1 weeks.  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

## 2012-03-21 ENCOUNTER — Ambulatory Visit (INDEPENDENT_AMBULATORY_CARE_PROVIDER_SITE_OTHER): Payer: 59 | Admitting: Marriage and Family Therapist

## 2012-03-21 DIAGNOSIS — F331 Major depressive disorder, recurrent, moderate: Secondary | ICD-10-CM

## 2012-03-21 DIAGNOSIS — F909 Attention-deficit hyperactivity disorder, unspecified type: Secondary | ICD-10-CM

## 2012-03-21 NOTE — Progress Notes (Unsigned)
   THERAPIST PROGRESS NOTE  Session Time: 3:00 - 4:00 p.m.  Participation Level: Active  Behavioral Response: CasualAlertDepressed (minimal)  Type of Therapy: Individual Therapy  Treatment Goals addressed: Coping  Interventions: Strength-based and Supportive  Summary: [name] is a 22 y.o. female who presents with depression and ADHD.  She was referred by Dr. Ladona Ridgel.   Patient reports she has continued again this week to feel "okay."  She reports this is due to not having much stress at home and that she believes she is getting her school work under control.  Patient states she did do some homework over Spring break but did very little.  She talked about transferring her credits to [employer] as her plan is on attending this college in the Fall.    Suicidal/Homicidal: Nowithout intent/plan  Therapist Response: for the past two weeks patient's depression has been minimal.  This is because her stressors e.g., school, home have not been as great.  Took this opportunity to discuss this fact.  Also discussed the idea that procrastination is common when someone also experiences perfectionism, and that part of patient's problems with school initially has to deal with her expecting too much from herself.  Patient agreed.  Today was more of a maintenance session and to assess patient's depression.  Plan: Return again in 1 weeks.  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

## 2012-03-28 ENCOUNTER — Ambulatory Visit (HOSPITAL_COMMUNITY): Payer: Self-pay | Admitting: Marriage and Family Therapist

## 2012-04-04 ENCOUNTER — Ambulatory Visit (INDEPENDENT_AMBULATORY_CARE_PROVIDER_SITE_OTHER): Payer: 59 | Admitting: Marriage and Family Therapist

## 2012-04-04 DIAGNOSIS — F988 Other specified behavioral and emotional disorders with onset usually occurring in childhood and adolescence: Secondary | ICD-10-CM

## 2012-04-04 DIAGNOSIS — F331 Major depressive disorder, recurrent, moderate: Secondary | ICD-10-CM

## 2012-04-05 NOTE — Progress Notes (Signed)
   THERAPIST PROGRESS NOTE  Session Time:  3:00 - 4:00 p.m.  Participation Level: Active  Behavioral Response: CasualAlertDepressed  Type of Therapy: Individual Therapy  Treatment Goals addressed: Coping  Interventions: Strength-based and Supportive  Summary: [name] is a 22 y.o. female who presents with depression and ADHD.  She was referred by Dr. Ladona Ridgel.  Patient states she is feeling depressed, "shut down," and overwhelmed about school.  She reports she has been procrastinating doing work for her English class leaving her overdue for a book report.  She reports she does not know why she avoids doing the work and does not know why in particular she is struggling with this class.  She did reports she is doing better in chemistry.  She also reports things at home have been stressful, that her brother is depressed and acting out and her parents are arguing.  Patient states when her parents start arguing she "heads for her room" or gets in the car and drives.   Suicidal/Homicidal: Nowithout intent/plan  Therapist Response:  Patient cried throughout the session.  Discussed the idea that she needs to contact her psychiatrist Dr. Toni Arthurs for help with her medications when she is struggling with life.  Discussed at length what could be getting in the way of patient getting her school work done.  Through the discussion patient was able to identify the topic of discussion is very difficult for her particular the reading material.  Patient did promise she would talk to her professor.  Also talked about patient needing to use support and learn how to not procrastinate the minute she finds she is having difficulty.  Discussed patient doing the work in chunks rather than using "all or nothing" thinking and behavior around her school work.  Discussed the idea that if patient is not addressing these issues now she will definitely have difficulty with she goes back to [employer].  Plan: Return again  in 1 weeks.  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

## 2012-04-11 ENCOUNTER — Ambulatory Visit (INDEPENDENT_AMBULATORY_CARE_PROVIDER_SITE_OTHER): Payer: 59 | Admitting: Marriage and Family Therapist

## 2012-04-11 DIAGNOSIS — F331 Major depressive disorder, recurrent, moderate: Secondary | ICD-10-CM

## 2012-04-11 DIAGNOSIS — F988 Other specified behavioral and emotional disorders with onset usually occurring in childhood and adolescence: Secondary | ICD-10-CM

## 2012-04-11 NOTE — Progress Notes (Signed)
   THERAPIST PROGRESS NOTE  Session Time: 3:00 - 4:00 p.m.  Participation Level: Active  Behavioral Response: CasualAlertDepressed  Type of Therapy: Individual Therapy  Treatment Goals addressed: Coping  Interventions: Strength-based and Supportive  Summary: [name] is a 22 y.o. female who presents with depression and ADHD.  She was referred to by Dr. Ladona Ridgel.  Patient reports "significant relief" in her depression and feeling overwhelmed.  She reports she made a decision to "forget her paper and take her licks" in [place].  She states she decided to "move on" and focus on her next paper, deciding that she will not receive and A in [place].  She did talk to her [place] professor who told her she was not failing [place] because he did very well on her first paper.  She reports things at home have been calm with the exception of her brother who has been depressed over school.  Patient also states she has an appointment with Dr. Toni Arthurs to discuss transferring medication care to this facility.   Suicidal/Homicidal: Nowithout intent/plan  Therapist Response:  Discussed patient making her decision to "let go and go on" relating to not doing her paper.  Reframed her decision based on patient's mental health symptoms i.e., the topic was too difficult and caused anxiety for her because she tried several ways to relate to the topic with no success.  She was able to let go of feeling guilty and ashamed (patient usually berates herself for a long time-period which this time she did not.  Pointed out that her decision was successful in this way.  Patient was able to state that giving up could be a problem for her once she is in a job.  Recommended patient obtain a release from Dr. Toni Arthurs relating to patient records.  Plan: Return again in 1 weeks.  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    Axis II: Deferred    Mary Schlafer, LMFT, CTS 04/11/2012

## 2012-04-18 ENCOUNTER — Ambulatory Visit (INDEPENDENT_AMBULATORY_CARE_PROVIDER_SITE_OTHER): Payer: 59 | Admitting: Marriage and Family Therapist

## 2012-04-18 DIAGNOSIS — F331 Major depressive disorder, recurrent, moderate: Secondary | ICD-10-CM

## 2012-04-18 DIAGNOSIS — F988 Other specified behavioral and emotional disorders with onset usually occurring in childhood and adolescence: Secondary | ICD-10-CM
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
## 2012-04-25 ENCOUNTER — Ambulatory Visit (INDEPENDENT_AMBULATORY_CARE_PROVIDER_SITE_OTHER): Payer: 59 | Admitting: Marriage and Family Therapist

## 2012-04-25 DIAGNOSIS — F909 Attention-deficit hyperactivity disorder, unspecified type: Secondary | ICD-10-CM

## 2012-04-25 DIAGNOSIS — F331 Major depressive disorder, recurrent, moderate: Secondary | ICD-10-CM

## 2012-04-25 NOTE — Progress Notes (Signed)
   THERAPIST PROGRESS NOTE  Session Time: 3:00 - 4:00 p.m.  Participation Level: Active  Behavioral Response: CasualAlertDepressed (mild)  Type of Therapy: Individual Therapy  Treatment Goals addressed: Coping  Interventions: Strength-based and Supportive  Summary: [name] is a 22 y.o. female who presents with depression and ADHD.  She was referred by Dr. Ladona Ridgel.  Patient reports she is feeling less depressed than last week.  She talked about her brother this week concerned that he may never have any focus in life.  She did say he is interested in Armed forces logistics/support/administrative officer and has been encouraging him to focus on this topic for next year.      Suicidal/Homicidal: No  Therapist Response: Patient was not focusing on her mental health condition this week.  She admitted with discussion that it had to do with last week being "very overwhelming."  Allowed the session to be light per what patient appeared to be able to handle and per her request.  Was able to talk about activities that would be soothing and/or fun for her and patient did say she went to the movies over the weekend and was also playing video games. Unfortunately, patient's depression is chronic and this session was one break for her.  Plan: Return again in 1 weeks.  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

## 2012-05-02 ENCOUNTER — Ambulatory Visit (INDEPENDENT_AMBULATORY_CARE_PROVIDER_SITE_OTHER): Payer: 59 | Admitting: Marriage and Family Therapist

## 2012-05-02 DIAGNOSIS — F331 Major depressive disorder, recurrent, moderate: Secondary | ICD-10-CM

## 2012-05-02 DIAGNOSIS — F988 Other specified behavioral and emotional disorders with onset usually occurring in childhood and adolescence: Secondary | ICD-10-CM

## 2012-05-02 NOTE — Progress Notes (Signed)
   THERAPIST PROGRESS NOTE  Session Time: 3:00 - 4:00 p.m.  Participation Level: Active  Behavioral Response: CasualAlertDepressed  Type of Therapy: Individual Therapy  Treatment Goals addressed: Coping  Interventions: Strength-based and Supportive  Summary: [name] is a 22 y.o. female who presents with depression and ADHD.  She was referred by Dr. Ladona Ridgel.   Patient reports her biggest concern is school.  She reports she has to decide whether or not to drop english since she has been having difficulty getting assignments done.  She admits this is the third english class she has taken and dropped.  She states knowing she will have to pay for this class.  She reports dreading telling her parents because they will be upset with her.  She states they have not been encouraging or supportive but are focused on brother Timothy Lasso and his mental health.  She also reports missing her meeting with [facility] and feeling "very bad about this."  Suicidal/Homicidal: No  Therapist Response: discussed patient's problems around school e.g., procrastination, anxiety, difficulty concentrating, having the same belief that all conflict she deals with has the same intensity for her.  Patient cried throughout the session stating she "felt like a failure and did not know how she will be able to hold a job."  Confronted her on procrastinating and suggested she decide what is the most important issue to address in the next 24 hours and patient was able to say studying for her chemistry test.  She stated that this was the most important thing she got out of this session.  Plan: Return again in 1 weeks.  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

## 2012-05-09 ENCOUNTER — Ambulatory Visit (INDEPENDENT_AMBULATORY_CARE_PROVIDER_SITE_OTHER): Payer: 59 | Admitting: Marriage and Family Therapist

## 2012-05-09 DIAGNOSIS — F988 Other specified behavioral and emotional disorders with onset usually occurring in childhood and adolescence: Secondary | ICD-10-CM

## 2012-05-09 DIAGNOSIS — F331 Major depressive disorder, recurrent, moderate: Secondary | ICD-10-CM

## 2012-05-09 NOTE — Progress Notes (Signed)
   THERAPIST PROGRESS NOTE  Session Time:  3:00 - 4:00 a.m.  Participation Level: Active  Behavioral Response: CasualAlertDepressed (mild)  Type of Therapy: Individual Therapy  Treatment Goals addressed: Coping  Interventions: Strength-based and Supportive  Summary: Samah Lapiana is a 22 y.o. female who presents with depression and ADHD.  She was referred by Dr. Ladona Ridgel.  Patient reports some improvement in her depression based on two reasons.  One, she talked to her mother about her college situation with the possibility that she may not get a good grade in classes and possibly losing her college grant money.  She reports her mother was not upset and supported patient and let her know that patient's father has difficulty in college due to learning disabilities.  Second, patient states she has accepted the situation that she will not receive an A in her classes and has learned the lesson to be careful how her tests and papers are scheduled.  Patient talked about probably needing to work and she and her mother went out and bought patient work clothes.  Suicidal/Homicidal: Nowithout intent/plan  Therapist Response:  Patient definitely appeared less hopeless this week evidence by self-report and body language.  She also was able to discuss how she was feeling about school without crying and seems to accept the situation.  Plan: Return again in 1 weeks.  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

## 2012-05-16 ENCOUNTER — Ambulatory Visit (INDEPENDENT_AMBULATORY_CARE_PROVIDER_SITE_OTHER): Payer: 59 | Admitting: Marriage and Family Therapist

## 2012-05-16 DIAGNOSIS — F331 Major depressive disorder, recurrent, moderate: Secondary | ICD-10-CM

## 2012-05-16 DIAGNOSIS — F988 Other specified behavioral and emotional disorders with onset usually occurring in childhood and adolescence: Secondary | ICD-10-CM

## 2012-05-16 NOTE — Progress Notes (Signed)
   THERAPIST PROGRESS NOTE  Session Time: 3:00 - 4:00 p.m.  Participation Level: Active  Behavioral Response: CasualAlertDepressed/irritated  Type of Therapy: Individual Therapy  Treatment Goals addressed: Coping  Interventions: Strength-based and Supportive  Summary: [name] is a 22 y.o. female who presents with depression and ADHD.  She was referred by Dr. Ladona Ridgel.   Patient reports some improvement in her mood because she finished final exams last week.  She also states she believes she passed chemistry with a "C" but believes she failed [place].  Patient states the tests were on the same day and took her well over six hours to complete.  She reports now all the wants to do is sleep.  She did say she is trying to get her materials together to forward to [facility] [employer] in the hope that she will be accepted back there.  Patient states she believes she will be okay relating to her depression since she is out of school.  Suicidal/Homicidal: Nowithout intent/plan  Therapist Response:   Asked patient how she felt about English and patient stated, "tired."  Asked again stating how did she feel about possibly failing and she answered feeling "irritated" by the way the teacher taught the class, giving little leeway to patient.  Tried to discuss what patient wants to do now that she is out of school until the Fall but patient only said "I'll be okay."  Today, because of patient feeling relief about being out of school, her depression was markedly better (self-report).  Discussed the toll school takes on patient emotionally and intellectually.  Plan: Return again in 1 weeks.  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

## 2012-05-23 ENCOUNTER — Ambulatory Visit (INDEPENDENT_AMBULATORY_CARE_PROVIDER_SITE_OTHER): Payer: 59 | Admitting: Marriage and Family Therapist

## 2012-05-23 DIAGNOSIS — F988 Other specified behavioral and emotional disorders with onset usually occurring in childhood and adolescence: Secondary | ICD-10-CM

## 2012-05-23 DIAGNOSIS — F331 Major depressive disorder, recurrent, moderate: Secondary | ICD-10-CM

## 2012-05-23 NOTE — Progress Notes (Signed)
   THERAPIST PROGRESS NOTE  Session Time: 3:00 - 4:00 p.m.  Participation Level: Active  Behavioral Response: CasualAlertDepressed (mild)  Type of Therapy: Individual Therapy  Treatment Goals addressed: Coping  Interventions: Strength-based and Supportive  Summary: [name] is a 22 y.o. female who presents with depression and ADHD.  She was referred by Jorje Guild, PA.  Patient reports an improvement in her depression.  She states it is because she is out of school and there is little stress in her family this week.  She also talked about what she would be doing with this Clinical research associate going on vacation.  Patient will be doing to a [employer] in [place] with a friend from high school.  She reports her appointment with Jorje Guild, PA was pushed back a few weeks due to him being on vacation.  Patient did say she is on probation concerning her financial aid that if she drops one more class next semester her aid will be dropped.  Suicidal/Homicidal: Nowithout intent/plan  Therapist Response:  Discussed with patient her needing to venture out and explore more, take more risks since she is going to be 22 in July.  Patient admits her parents are very protective of her and she believes in some ways still treats her like an child and then in some ways treats her like an adult for things she does not believe she is capable of doing.  Discussed school probation and what this could mean to patient's future.  Plan: Return again in 3 weeks.  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

## 2012-06-13 ENCOUNTER — Ambulatory Visit (HOSPITAL_COMMUNITY): Payer: 59 | Admitting: Marriage and Family Therapist

## 2012-06-13 NOTE — Progress Notes (Unsigned)
   THERAPIST PROGRESS NOTE  Session Time:  3:00 - 4:00 p.m.  Participation Level: {BHH PARTICIPATION LEVEL:22264}  Behavioral Response: {Appearance:22683}{BHH LEVEL OF CONSCIOUSNESS:22305}{BHH MOOD:22306}  Type of Therapy: Individual Therapy  Treatment Goals addressed: Coping  Interventions: {CHL AMB BH Type of Intervention:21022753}  Summary: [name] is a 22 y.o. female who presents with depression and ADHD.  She was referred by Dr. Ladona Ridgel.     Suicidal/Homicidal: {BHH YES OR NO:22294}{yes/no/with/without intent/plan:22693}  Therapist Response: ***  Plan: Return again in 1 weeks.  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

## 2012-06-15 ENCOUNTER — Ambulatory Visit (HOSPITAL_COMMUNITY): Payer: Self-pay | Admitting: Physician Assistant

## 2012-06-29 ENCOUNTER — Ambulatory Visit (HOSPITAL_COMMUNITY): Payer: Self-pay | Admitting: Marriage and Family Therapist

## 2012-07-04 ENCOUNTER — Ambulatory Visit (INDEPENDENT_AMBULATORY_CARE_PROVIDER_SITE_OTHER): Payer: 59 | Admitting: Marriage and Family Therapist

## 2012-07-04 DIAGNOSIS — F988 Other specified behavioral and emotional disorders with onset usually occurring in childhood and adolescence: Secondary | ICD-10-CM

## 2012-07-04 DIAGNOSIS — F331 Major depressive disorder, recurrent, moderate: Secondary | ICD-10-CM
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
## 2012-07-05 ENCOUNTER — Ambulatory Visit (INDEPENDENT_AMBULATORY_CARE_PROVIDER_SITE_OTHER): Payer: 59 | Admitting: Physician Assistant

## 2012-07-05 VITALS — BP 124/79 | HR 101 | Ht 62.0 in | Wt 193.0 lb

## 2012-07-05 DIAGNOSIS — F411 Generalized anxiety disorder: Secondary | ICD-10-CM

## 2012-07-05 DIAGNOSIS — F331 Major depressive disorder, recurrent, moderate: Secondary | ICD-10-CM

## 2012-07-07 ENCOUNTER — Other Ambulatory Visit (HOSPITAL_COMMUNITY): Payer: Self-pay | Admitting: Physician Assistant

## 2012-07-07 ENCOUNTER — Telehealth (HOSPITAL_COMMUNITY): Payer: Self-pay

## 2012-07-07 MED ORDER — TRAZODONE HCL 50 MG PO TABS: 50.0000 mg | ORAL_TABLET | Freq: Every day | ORAL | Status: DC

## 2012-07-09 ENCOUNTER — Encounter (HOSPITAL_COMMUNITY): Payer: Self-pay | Admitting: Physician Assistant
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
## 2012-07-09 NOTE — Progress Notes (Deleted)
Psychiatric Assessment Adult  Patient Identification:  [name] Date of Evaluation:  07/09/2012 Chief Complaint: *** History of Chief Complaint:   Chief Complaint  Patient presents with  . Establish Care  . Depression  . Anxiety  . ADHD    HPI Review of Systems Physical Exam  Depressive Symptoms: {DEPRESSION SYMPTOMS:20000}  (Hypo) Manic Symptoms:   Elevated Mood:  {BHH YES OR NO:22294} Irritable Mood:  {BHH YES OR NO:22294} Grandiosity:  {BHH YES OR NO:22294} Distractibility:  {BHH YES OR NO:22294} Labiality of Mood:  {BHH YES OR NO:22294} Delusions:  {BHH YES OR NO:22294} Hallucinations:  {BHH YES OR NO:22294} Impulsivity:  {BHH YES OR NO:22294} Sexually Inappropriate Behavior:  {BHH YES OR NO:22294} Financial Extravagance:  {BHH YES OR NO:22294} Flight of Ideas:  {BHH YES OR NO:22294}  Anxiety Symptoms: Excessive Worry:  {BHH YES OR NO:22294} Panic Symptoms:  {BHH YES OR NO:22294} Agoraphobia:  {BHH YES OR NO:22294} Obsessive Compulsive: {BHH YES OR NO:22294}  Symptoms: {Obsessive Compulsive Symptoms:22671} Specific Phobias:  {BHH YES OR NO:22294} Social Anxiety:  {BHH YES OR NO:22294}  Psychotic Symptoms:  Hallucinations: {BHH YES OR NO:22294} {Hallucinations:22672} Delusions:  {BHH YES OR NO:22294} Paranoia:  {BHH YES OR NO:22294}   Ideas of Reference:  {BHH YES OR NO:22294}  PTSD Symptoms: Ever had a traumatic exposure:  {BHH YES OR NO:22294} Had a traumatic exposure in the last month:  {BHH YES OR NO:22294} Re-experiencing: {BHH YES OR NO:22294} {Re-experiencing:22673} Hypervigilance:  {BHH YES OR NO:22294} Hyperarousal: {BHH YES OR NO:22294} {Hyperarousal:22674} Avoidance: {BHH YES OR NO:22294} {Avoidance:22675}  Traumatic Brain Injury: {BHH YES OR NO:22294} {Traumatic Brain Injury:22676}  Past Psychiatric History: Diagnosis: ***  Hospitalizations: ***  Outpatient Care: ***  Substance Abuse Care: ***  Self-Mutilation: ***  Suicidal Attempts:  ***  Violent Behaviors: ***   Past Medical History:   Past Medical History  Diagnosis Date  . Depression   . Anxiety   . ADHD (attention deficit hyperactivity disorder)    History of Loss of Consciousness:  {BHH YES OR NO:22294} Seizure History:  {BHH YES OR NO:22294} Cardiac History:  {BHH YES OR NO:22294} Allergies:  No Known Allergies Current Medications:  Current Outpatient Prescriptions  Medication Sig Dispense Refill  . lamoTRIgine (LAMICTAL) 100 MG tablet Take 100 mg by mouth daily.      Marland Kitchen lisdexamfetamine (VYVANSE) 70 MG capsule Take 70 mg by mouth every morning.      . mirtazapine (REMERON) 30 MG tablet Take 30 mg by mouth at bedtime.      Suzzanne Cloud Estradiol (MONONESSA PO) Take by mouth.      . sertraline (ZOLOFT) 100 MG tablet Take 100 mg by mouth daily.      . traZODone (DESYREL) 50 MG tablet Take 1 tablet (50 mg total) by mouth at bedtime.  30 tablet  1   No current facility-administered medications for this visit.    Previous Psychotropic Medications:  Medication Dose   ***  ***                     Substance Abuse History in the last 12 months: Substance Age of 1st Use Last Use Amount Specific Type  Nicotine  ***  ***  ***  ***  Alcohol  ***  ***  ***  ***  Cannabis  ***  ***  ***  ***  Opiates  ***  ***  ***  ***  Cocaine  ***  ***  ***  ***  Methamphetamines  ***  ***  ***  ***  LSD  ***  ***  ***  ***  Ecstasy  ***   ***  ***  ***  Benzodiazepines  ***  ***  ***  ***  Caffeine  ***  ***  ***  ***  Inhalants  ***  ***  ***  ***  Others:                          Medical Consequences of Substance Abuse: ***  Legal Consequences of Substance Abuse: ***  Family Consequences of Substance Abuse: ***  Blackouts:  {BHH YES OR NO:22294} DT's:  {BHH YES OR NO:22294} Withdrawal Symptoms:  {BHH YES OR NO:22294} {Withdrawal Symptoms:22677}  Social History: Current Place of Residence: *** Place of Birth: *** Family Members: *** Marital  Status:  {Marital Status:22678} Children: ***  Sons: ***  Daughters: *** Relationships: *** Education:  {Education:22679} Educational Problems/Performance: *** Religious Beliefs/Practices: *** History of Abuse: {Desc; abuse:16542} Occupational Experiences; Military History:  {Military History:22680} Legal History: *** Hobbies/Interests: ***  Family History:  No family history on file.  Mental Status Examination/Evaluation: Objective:  Appearance: {Appearance:22683}  Eye Contact::  {BHH EYE CONTACT:22684}  Speech:  {Speech:22685}  Volume:  {Volume (PAA):22686}  Mood:  ***  Affect:  {Affect (PAA):22687}  Thought Process:  {Thought Process (PAA):22688}  Orientation:  {BHH ORIENTATION (PAA):22689}  Thought Content:  {Thought Content:22690}  Suicidal Thoughts:  {ST/HT (PAA):22692}  Homicidal Thoughts:  {ST/HT (PAA):22692}  Judgement:  {Judgement (PAA):22694}  Insight:  {Insight (PAA):22695}  Psychomotor Activity:  {Psychomotor (PAA):22696}  Akathisia:  {BHH YES OR NO:22294}  Handed:  {Handed:22697}  AIMS (if indicated):  ***  Assets:  {Assets (PAA):22698}    Laboratory/X-Ray Psychological Evaluation(s)   ***  ***   Assessment:  {axis diagnosis:3049000}  AXIS I {psych axis 1:31909}  AXIS II {psych axis 2:31910}  AXIS III Past Medical History  Diagnosis Date  . Depression   . Anxiety   . ADHD (attention deficit hyperactivity disorder)      AXIS IV {psych axis iv:31915}  AXIS V {psych axis v score:31919}   Treatment Plan/Recommendations:  Plan of Care: ***  Laboratory:  {Laboratory:22682}  Psychotherapy: ***  Medications: ***  Routine PRN Medications:  {BHH YES OR WU:98119}  Consultations: ***  Safety Concerns:  ***  OtherJorje Guild, PA-C 6/29/20146:02 PM

## 2012-07-18 ENCOUNTER — Ambulatory Visit (HOSPITAL_COMMUNITY): Payer: Self-pay | Admitting: Marriage and Family Therapist

## 2012-07-18 ENCOUNTER — Ambulatory Visit (INDEPENDENT_AMBULATORY_CARE_PROVIDER_SITE_OTHER): Payer: 59 | Admitting: Internal Medicine

## 2012-07-18 ENCOUNTER — Encounter: Payer: Self-pay | Admitting: Internal Medicine

## 2012-07-18 VITALS — BP 104/60 | HR 105 | Temp 98.3°F | Resp 12 | Ht 61.5 in | Wt 192.5 lb

## 2012-07-18 DIAGNOSIS — R635 Abnormal weight gain: Secondary | ICD-10-CM

## 2012-07-18 LAB — T3, FREE: T3, Free: 2.7 pg/mL (ref 2.3–4.2)

## 2012-07-18 LAB — HEMOGLOBIN A1C: Hgb A1c MFr Bld: 5.8 % (ref 4.6–6.5)

## 2012-07-18 LAB — T4, FREE: Free T4: 0.71 ng/dL (ref 0.60–1.60)

## 2012-07-18 NOTE — Progress Notes (Signed)
Subjective:     Patient ID: [name], female   DOB: 1990-06-08, 22 y.o.   MRN: [id number]  HPI [name] is a 22 y/o woman self-referred for evaluation and treatment for possible thyroid problem and /or diabetes. She is here with her mother, who offers some of the history  She had with severe dysmenorrhea and menorrhagia. Her menstrual cycles were also irregular >> started OCP (now Sprintec). Pain and bleeding is much better. She takes them continuously. She was checked for thyroid conditions then >> low normal, not rechecked afterwards.  Besides menstrual cycle problems, pt c/o: - Weight gain:stable, steady, gained 12 lbs in last 6 months. Weight gain exacerbated around the time of OCP starting, but in 2011: 115 lbs >> now: 192 lbs. She did change her diet: eats arger portions c/w 2011, cravings for sweets esp. around periods. 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 Needs to take Trazodone and Remeron to go to sleep.  - hot flushes, >3x a week, randomly, more if goes outside - no acne/hirsutism - has hair falling - easily tearing and splitting fingernails - no dry skin - feeling thirsty, but does not drink enough and does not have nocturia - gets shaky if not eating q2-3 hours - GERD - was on Tagamet in the past - diarrhea - 2x a day, 10-11x a week, usually in relationship to what she eats - slow healing wounds - cuts take months to heal - chest and abdominal pain - sporadic, with exercise or anxiety attacks, midchest or stabbing pain lower ribs, low abdomen, too - cannot stand or walk for a long time - new stretch marks for the last 2 years, now worse - abdomen and breasts (red) - had recent appearance of skin tags on neck  Meals: - Breakfast: cereal, milk, or nothing - Lunch: snacks - Dinner: chicken/meat +  veggies + rice + fruit - Snacks: milk, crackers Drinks a lot of 2% milk. Eats out, fried foods >> in last 3 weeks eating only 1-2x a week out.  No formal exercise.  FH of DM2 in father, GM, prediabetes in mother. No FH of thyroid disorders.   Review of Systems Constitutional: + weight gain, + fatigue, no subjective hyperthermia, poor sleep Eyes: no blurry vision, no xerophthalmia ENT: no sore throat, no nodules palpated in throat, no dysphagia/odynophagia, no hoarseness Cardiovascular: + CP/+ SOB/palpitations/leg swelling Respiratory: no cough/SOB Gastrointestinal: +N/no V/+ D/no C/+ GERD Musculoskeletal: + muscle aches/no joint aches Skin: no rashes except stretch marks Neurological: no tremors/numbness/tingling/dizziness Psychiatric: no depression/anxiety  Past Medical History  Diagnosis Date  . Depression   . Anxiety   . ADHD (attention deficit hyperactivity disorder)   . Obesity   . GERD (gastroesophageal reflux disease)   . Environmental allergies   . Myopia of both eyes    History reviewed. No pertinent past surgical history.  History   Social History  . Marital Status: Single    Spouse Name: N/A    Number of Children: 0   Occupational History  . Student    Social History Main Topics  . Smoking status: Never Smoker   . Smokeless tobacco: Not on file  . Alcohol Use: No  . Drug Use: No  . Sexually Active: No   Social History Narrative   07/05/12 AHW  She graduated  high school. She is currently attending [employer] studying biology. She plans to return to [employer] to complete a degree in marine biology.  She is currently unemployed. 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  She denies any history of legal problems. She reports that her social support system consists of her therapist.  07/05/12 AHW      Regular exercise: no   Caffeine use: no   Current Outpatient Prescriptions on File Prior to Visit  Medication Sig Dispense Refill  . lamoTRIgine (LAMICTAL) 100 MG  tablet Take 100 mg by mouth daily.      Marland Kitchen lisdexamfetamine (VYVANSE) 70 MG capsule Take 70 mg by mouth every morning.      Suzzanne Cloud Estradiol (MONONESSA PO) Take by mouth.      . sertraline (ZOLOFT) 100 MG tablet Take 100 mg by mouth daily.      . traZODone (DESYREL) 50 MG tablet Take 1 tablet (50 mg total) by mouth at bedtime.  30 tablet  1  . mirtazapine (REMERON) 30 MG tablet Take 30 mg by mouth at bedtime.       No current facility-administered medications on file prior to visit.   Allergies  Allergen Reactions  . Cymbalta (Duloxetine Hcl) Other (See Comments)    Hypotension   Family History  Problem Relation Age of Onset  . Bipolar disorder Father   . Bipolar disorder Brother   . Depression Mother   . Bipolar disorder Paternal Aunt    Objective:   Physical Exam BP 104/60  Pulse 105  Temp(Src) 98.3 F (36.8 C) (Oral)  Resp 12  Ht 5' 1.5" (1.562 m)  Wt 192 lb 8 oz (87.317 kg)  BMI 35.79 kg/m2  SpO2 97% Wt Readings from Last 3 Encounters:  07/18/12 192 lb 8 oz (87.317 kg)  07/05/12 193 lb (87.544 kg)   Constitutional: overweight, in NAD Eyes: PERRLA, EOMI, no exophthalmos ENT: moist mucous membranes, no thyromegaly, no cervical lymphadenopathy Cardiovascular: RRR, No MRG Respiratory: CTA B Gastrointestinal: abdomen soft, NT, ND, BS+ Musculoskeletal: no deformities, strength intact in all 4 Skin: moist, warm, acanthosis nigricans on neck, thin pink stretch marks on abdomen. Neurological: no tremor with outstretched hands, DTR normal in all 4  Assessment:     1. Weight gain - ? If hypothyroidism - ? If diabetes   2. Likely insulin resistance    Plan:     I had a long discussion with patient and her mother regarding the need for weight loss especially in the setting of insulin resistance. It is possible that some of her psycotropic medications or even her oral contraceptives could have influenced the weight gain, but a poor diet is usually the main  culprit. We discussed at length about ways to improve her diet, and she did start to implement several of these changes: eats out less, eats less fried food, includes more fruit and vegetables in her diet. I also suggested that she switches from 2% milk to almond or soy milk, as a means to reduce animal protein, fat, and calories. I also made other suggestions regarding healthier starches and lower glycemic index foods. For more details, please see patient instructions section. The patient and her mom seem open to implement some of these changes. We discussed about a vegan diet, and I explained the many benefits, possible sources of vegetables proteins, the need for B12 supplementation, and possible hurdles into implementing his diet. I also gave her materials about a plant-based diet. I advised her  to try to do this for at least 3 weeks, if she decides to try it. We also discussed about exercise, but I advised her to start with the diet, and as she loses weight and gets more energetic, also start exercising. I advised her to try to eliminate computer time at night and to try to relax or read instead (regular books, not electronic) and to try to eliminate the Remeron or trazodone if possible, in an effort to reduce the number of medicines she takes.  - I will also refer her to nutrition for more in-depth discussion and possible personalized meal plan - I do not see a consistent pattern of symptoms to suggest hypothyroidism or diabetes, but we did discuss about her higher risk of diabetes since she has family history of the disease (diabetes in father and prediabetes in mother) but especially with her obesity and high insulin resistance. She has clear signs of insulin resistance (acanthosis nigricans, skin tags, and also possible reactive hypoglycemia) and is again increases her risk of diabetes. I would check a hemoglobin A1c today. I will add thyroid function tests. - per mom's request, we can also check for  elevated cortisol levels by performing a 24-hour urine collection for cortisol. This can be influenced by Major Depression or by being on oral contraceptives (although less likely than a blood cortisol level). - I will see the patient back in 6 months for evaluation - I advised her to try my chart and I will release the lab results to my chart - patient had a hard time getting labs today since he gets very anxious due to her needle phobia...   Medical Care team: - Dr. Juluis Rainier (PCP) - Dr. Rae Halsted  - Cleophas Dunker (therapist) - Dr. Viviann Spare [facility])  Office Visit on 07/18/2012  Component Date Value Range Status  . TSH 07/18/2012 3.10  0.35 - 5.50 uIU/mL Final  . Free T4 07/18/2012 0.71  0.60 - 1.60 ng/dL Final  . T3, Free 16/10/9602 2.7  2.3 - 4.2 pg/mL Final  . Hemoglobin A1C 07/18/2012 5.8  4.6 - 6.5 % Final   Glycemic Control Guidelines for People with Diabetes:Non Diabetic:  <6%Goal of Therapy: <7%Additional Action Suggested:  >8%    Dear Lindie Spruce, Good news: no thyroid dysfunction! However, your HbA1C is a little high (5.7- 6.4% = prediabetes), but weight loss should greatly help. I will await the urine collection results and will let you know as soon as I get them. Please let me know if you have any questions. Sincerely, Carlus Pavlov MD  08/01/2012: 24h UFC still pending. Will addend results when available.

## 2012-07-18 NOTE — Patient Instructions (Signed)
Please return in 6 months for reevaluation. Please join MyChart - I will send you the results of the labs through there.  Please consider the following ways to cut down carbs and fat and increase fiber and micronutrients in your diet: - substitute whole grain for white bread or pasta - substitute brown rice for white rice - substitute 90-calorie flat bread pieces for slices of bread when possible - substitute sweet potatoes or yams for white potatoes - substitute humus for margarine - substitute tofu for cheese when possible - substitute almond or rice milk for regular milk (would not drink soy milk daily due to concern for soy estrogen influence on breast cancer risk) - substitute dark chocolate for other sweets when possible - substitute water - can add lemon or orange slices for taste - for diet sodas (artificial sweeteners will trick your body that you can eat sweets without getting calories and will lead you to overeating and weight gain in the long run) - do not skip breakfast or other meals (this will slow down the metabolism and will result in more weight gain over time)  - can try smoothies made from fruit and almond/rice milk in am instead of regular breakfast - can also try old-fashioned (not instant) oatmeal made with almond/rice milk in am - order the dressing on the side when eating salad at a restaurant (pour less than half of the dressing on the salad) - eat as little meat as possible - can try juicing, but should not forget that juicing will get rid of the fiber, so would alternate with eating raw veg./fruits or drinking smoothies - use as little oil as possible, even when using olive oil - can dress a salad with a mix of balsamic vinegar and lemon juice, for e.g. - use agave nectar, stevia sugar, or regular sugar rather than artificial sweateners - steam or broil/roast veggies  - snack on veggies/fruit/nuts (unsalted, preferably) when possible, rather than processed foods -  reduce or eliminate aspartame in diet (it is in diet sodas, chewing gum, etc) Read the labels!  Try to read Dr. Katherina Right book: "Program for Reversing Diabetes" for the vegan concept and other ideas for healthy eating.  Plant-based diet materials:  - Lectures (you tube):  Lequita Asal: "Breaking the Food Seduction"  Doug Lisle: "How to Lose Weight, without Losing Your Mind"  Lucile Crater: "What is Insulin Resistance" [employer].si - Documentaries:  Supersize Me  Food [employer]  Forks over [employer], Sick and Nearly Dead  The [employer] of the [employer] - Books:  Lequita Asal: "Program for Reversing Diabetes"  Ferol Luz: "The [place] Study"  Konrad Penta: "Supermarket Vegan" (cookbook) - Facebook pages:   Reece Agar versus Knives  Vegucated  [employer] Us Magazine  Food Matters - Healthy nutrition info websites:  LateTelevision.com.ee

## 2012-07-24 ENCOUNTER — Other Ambulatory Visit (HOSPITAL_COMMUNITY): Payer: Self-pay | Admitting: Physician Assistant

## 2012-07-24 MED ORDER — TRAZODONE HCL 50 MG PO TABS: 75.0000 mg | ORAL_TABLET | Freq: Every day | ORAL | Status: DC

## 2012-08-01 ENCOUNTER — Ambulatory Visit (INDEPENDENT_AMBULATORY_CARE_PROVIDER_SITE_OTHER): Payer: 59 | Admitting: Marriage and Family Therapist

## 2012-08-01 DIAGNOSIS — F331 Major depressive disorder, recurrent, moderate: Secondary | ICD-10-CM

## 2012-08-01 DIAGNOSIS — F909 Attention-deficit hyperactivity disorder, unspecified type: Secondary | ICD-10-CM

## 2012-08-01 NOTE — Progress Notes (Signed)
   THERAPIST PROGRESS NOTE  Session Time:  2:00 - 3:00 p.m.  Participation Level: Active  Behavioral Response: CasualAlertNot depressed at the time of the session  Type of Therapy: Individual Therapy  Treatment Goals addressed: Coping  Interventions: Strength-based and Supportive  Summary: [name] is a 22 y.o. female who presents with depression and ADHD.  She was referred by Dr. Ladona Ridgel.   Patient reports she has been very active.  She reports having a blood test and found out the is pre-diabetic.  She also states her doctor recommended she go vegan and patient is learning how to eat without dairy and meat.  She reports it has been surprisingly easy and believes it is because she is motivated.  She reports losing seven pounds.  She reports having an appointment with a dietitian.   She reports her mother has been supportive in her meal change but that this has been difficult on her family, particularly her younger brother.  Patient also states she spent 11 hours one day meeting with her friends she met at [employer] going to various places like [place] and playing games.  Patient also states she spent a day with her mother going to a [employer], going out to lunch, and visiting various stores.  Patient reports she is taking two classes this semester that starts in August.  She reports not feeling good about school because her GPA is a 2.0 and she is on academic probation.  She did say the only time she was depressed was when she tried to plan a dinner for her parents' 30th anniversary and her parents were fighting with each other and with patient, father was in bed "feeling sorry for himself," and mother did not want to go anywhere.  Suicidal/Homicidal: Nowithout intent/plan  Therapist Response:   Discussed patient's new change in diet and becoming more healthy.  Also discussed patient starting to have a social life for the first time in years and how this feels.  Discussed school.  Discussed  patient's depression being situational but also discussed the family dysfunction (her words) symbolized by what happens during celebrations.  Plan: Return again in 2 weeks.  Diagnosis: Axis I: Major depressive d/o, moderate; ADHD w/o hyperactivity    Axis II: Deferred    Debroh Sieloff, LMFT, CTS 08/01/2012

## 2012-08-03 ENCOUNTER — Ambulatory Visit: Payer: Self-pay | Admitting: Dietician

## 2012-08-15 ENCOUNTER — Ambulatory Visit (HOSPITAL_COMMUNITY): Payer: Self-pay | Admitting: Marriage and Family Therapist

## 2012-08-20 ENCOUNTER — Emergency Department (HOSPITAL_COMMUNITY)
Admission: EM | Admit: 2012-08-20 | Discharge: 2012-08-20 | Disposition: A | Discharge status: Home / Self Care | Payer: 59 | Attending: Emergency Medicine | Admitting: Emergency Medicine

## 2012-08-20 ENCOUNTER — Encounter (HOSPITAL_COMMUNITY): Payer: Self-pay | Admitting: Nurse Practitioner

## 2012-08-20 DIAGNOSIS — F909 Attention-deficit hyperactivity disorder, unspecified type: Secondary | ICD-10-CM | POA: Insufficient documentation

## 2012-08-20 DIAGNOSIS — Z79899 Other long term (current) drug therapy: Secondary | ICD-10-CM | POA: Insufficient documentation

## 2012-08-20 DIAGNOSIS — Z8719 Personal history of other diseases of the digestive system: Secondary | ICD-10-CM | POA: Insufficient documentation

## 2012-08-20 DIAGNOSIS — Y9289 Other specified places as the place of occurrence of the external cause: Secondary | ICD-10-CM | POA: Insufficient documentation

## 2012-08-20 DIAGNOSIS — E669 Obesity, unspecified: Secondary | ICD-10-CM | POA: Insufficient documentation

## 2012-08-20 DIAGNOSIS — R42 Dizziness and giddiness: Secondary | ICD-10-CM | POA: Insufficient documentation

## 2012-08-20 DIAGNOSIS — Z8669 Personal history of other diseases of the nervous system and sense organs: Secondary | ICD-10-CM | POA: Insufficient documentation

## 2012-08-20 DIAGNOSIS — F411 Generalized anxiety disorder: Secondary | ICD-10-CM | POA: Insufficient documentation

## 2012-08-20 DIAGNOSIS — IMO0002 Reserved for concepts with insufficient information to code with codable children: Secondary | ICD-10-CM | POA: Insufficient documentation

## 2012-08-20 DIAGNOSIS — F3289 Other specified depressive episodes: Secondary | ICD-10-CM | POA: Insufficient documentation

## 2012-08-20 DIAGNOSIS — Y9389 Activity, other specified: Secondary | ICD-10-CM | POA: Insufficient documentation

## 2012-08-20 DIAGNOSIS — F329 Major depressive disorder, single episode, unspecified: Secondary | ICD-10-CM | POA: Insufficient documentation

## 2012-08-20 DIAGNOSIS — S0990XA Unspecified injury of head, initial encounter: Secondary | ICD-10-CM

## 2012-08-20 MED ORDER — MECLIZINE HCL 50 MG PO TABS: 25.0000 mg | ORAL_TABLET | Freq: Three times a day (TID) | ORAL | Status: DC | PRN

## 2012-08-20 NOTE — ED Notes (Signed)
Pt states she banged her head on her refrigerator and wall due to frustration 2 days ago and has been having worsening sx. Pt reports being disoriented, headache, problems with depth perception, and pressure in skull. Pt denies any n/v. Pt has been taking tylenol, and advil for pain and otc motion sickness meds. Rates pain 3/10. Mother states pt has less energy than usual but is alert per norm.

## 2012-08-20 NOTE — ED Provider Notes (Signed)
CSN: [id number]     Arrival date & time 08/20/12  1757 History     None    Chief Complaint  Patient presents with  . Head Injury   (Consider location/radiation/quality/duration/timing/severity/associated sxs/prior Treatment) HPI Pt is a 22yo female with hx of anxiety and depression c/o headache and concern for concussion after hitting her head several times on a wall 2 days ago because she was "upset and frustrated."  Pt states her headache is constant aching in front of her head, 3/10, worse with standing and moving her head. Also reports dizziness with room spinning, sensation she has had in the past and time OTC motion sickness medication for.  Has had minimal relief.  Also c/o sensitivity to light and sound, intermittent disorientation, and problems with depth perception.  Denies LOC.  Has been taking Advil and Tylenol with minimal relief.  Denies SI/HI.   Past Medical History  Diagnosis Date  . Depression   . Anxiety   . ADHD (attention deficit hyperactivity disorder)   . Obesity   . GERD (gastroesophageal reflux disease)   . Environmental allergies   . Myopia of both eyes    History reviewed. No pertinent past surgical history. Family History  Problem Relation Age of Onset  . Bipolar disorder Father   . Bipolar disorder Brother   . Depression Mother   . Bipolar disorder Paternal Aunt    History  Substance Use Topics  . Smoking status: Never Smoker   . Smokeless tobacco: Not on file  . Alcohol Use: No   OB History   Grav Para Term Preterm Abortions TAB SAB Ect Mult Living                 Review of Systems  Constitutional: Negative for fever and chills.  Neurological: Positive for dizziness and headaches. Negative for seizures, syncope, weakness, light-headedness and numbness.  Psychiatric/Behavioral: Positive for agitation. The patient is nervous/anxious.   All other systems reviewed and are negative.    Allergies  Cymbalta  Home Medications   Current  Outpatient Rx  Name  Route  Sig  Dispense  Refill  . acetaminophen (TYLENOL) 500 MG tablet   Oral   Take 500 mg by mouth every 6 (six) hours as needed for pain.         Marland Kitchen ALPRAZolam (XANAX) 0.5 MG tablet   Oral   Take 0.5 mg by mouth as needed for sleep.         Marland Kitchen lamoTRIgine (LAMICTAL) 200 MG tablet   Oral   Take 100-200 mg by mouth See admin instructions. Alternates every other night. Last dose was 0.5 tablet         . lisdexamfetamine (VYVANSE) 70 MG capsule   Oral   Take 70 mg by mouth daily as needed (only takes during school year).          . Naproxen Sodium (ALEVE PO)   Oral   Take 2 tablets by mouth daily as needed (for pain).         . norgestimate-ethinyl estradiol (SPRINTEC 28) 0.25-35 MG-MCG tablet   Oral   Take 1 tablet by mouth daily.         . sertraline (ZOLOFT) 100 MG tablet   Oral   Take 100 mg by mouth daily.         . traZODone (DESYREL) 50 MG tablet   Oral   Take 1.5 tablets (75 mg total) by mouth at bedtime.   45 tablet  1   . meclizine (ANTIVERT) 50 MG tablet   Oral   Take 0.5 tablets (25 mg total) by mouth 3 (three) times daily as needed for dizziness or nausea.   30 tablet   0    BP 117/80  Pulse 87  Temp(Src) 98.3 F (36.8 C) (Oral)  Resp 16  Ht 5\' 2"  (1.575 m)  Wt 186 lb (84.369 kg)  BMI 34.01 kg/m2  SpO2 97% Physical Exam  Nursing note and vitals reviewed. Constitutional: She is oriented to person, place, and time. She appears well-developed and well-nourished. No distress.  Pt appears to have a flat affect. NAD.   HENT:  Head: Normocephalic and atraumatic.  Right Ear: Hearing, tympanic membrane, external ear and ear canal normal.  Left Ear: Hearing, tympanic membrane, external ear and ear canal normal.  Nose: Nose normal.  Mouth/Throat: Uvula is midline and oropharynx is clear and moist. No oropharyngeal exudate.  TTP over forehead.  No ecchymosis, edema, or deformity.  No crepitus.   Eyes: Conjunctivae and EOM  are normal. Pupils are equal, round, and reactive to light. Right eye exhibits no discharge. Left eye exhibits no discharge. No scleral icterus.  Neck: Normal range of motion. Neck supple.  No midline bony tenderness, step offs or crepitus  Cardiovascular: Normal rate, regular rhythm and normal heart sounds.   Pulmonary/Chest: Effort normal and breath sounds normal. No respiratory distress. She has no wheezes. She has no rales. She exhibits no tenderness.  Abdominal: Soft. Bowel sounds are normal. She exhibits no distension and no mass. There is no tenderness. There is no rebound and no guarding.  Musculoskeletal: Normal range of motion.  Neurological: She is alert and oriented to person, place, and time.  Skin: Skin is warm and dry. She is not diaphoretic.  Psychiatric: Her behavior is normal. She exhibits a depressed mood.    ED Course   Procedures (including critical care time)  Labs Reviewed - No data to display No results found. 1. Head injury, acute, initial encounter   2. Dizziness     MDM  Pt c/o headache and dizziness, has hx of anxiety and depression.  Denies SI/HI. Appears to have flat affect. Nl neuro exam.  No head CT is warranted at this time based off [place] [facility] rules. Will discharge home and start pt on trial of meclizine as she reports vertiginous type dizziness.  F/u with PCP, Dr. Zachery Dauer for continued symptoms.  Provided pt info packet on concussions.  Return precautions provided.  Mom and pt verbalized understanding and agreement with tx plan.    Junius Finner, PA-C 08/20/12 [phone]

## 2012-08-20 NOTE — Discharge Instructions (Signed)
Concussion and Brain Injury  A blow to the head can stop the brain from working normally (concussion). It is usually not life-threatening. However, the results of the injury can be serious. Problems caused by the injury might show up right away or days or weeks later. Getting better might take some time.  HOME CARE   Rest your body. Ways to rest your body include:   Getting plenty of sleep at night.   Going to sleep early.   Taking naps during the day when you feel tired.   Limit activities that require a lot of thought. This includes:   Time spent with homework.   Time spent with work related to a job.   TV watching.   Computer use.   Return to normal activities (driving, work, school) only when your doctor says it is okay.   Avoid high impact activity and sports until your doctor says it is okay.   Take medicines only as told by your doctor.   Do not drink alcohol until your doctor says it is okay.   Do not make important decisions without help until you feel better.   Follow up with your doctor as told.  GET HELP RIGHT AWAY IF:   You, your family, or your friends notice that:   You have bad headaches, or they get worse.   You have weakness, loss of feeling (numbness), or you feel off balance.   You keep throwing up (vomiting).   You feel tired or pass out (faint).   One black center of your eye (pupil) is larger than the other.   You twitch or shake (seize).   Your speech is not clear (slurred).   You are confused, restless, easily angered (agitated), or annoyed (irritable).   You cannot recognize or respond to people or activities.   You have neck pain.   You have trouble being woken up.   Your behavior changes.  MAKE SURE YOU:    Understand these instructions.   Will watch your condition.   Will get help right away if you are not doing well or get worse.  Document Released: 12/16/2008 Document Revised: 03/22/2011 Document Reviewed: 12/16/2008  [employer] Patient Information 2014  [employer]

## 2012-08-20 NOTE — ED Notes (Signed)
Pt hit her head on the wall several times 2 days ago, on purpose, because she was "upset and frustrated." now she is concerned for head injury. Denies LOC but states she is having dizziness, headaches and head pressure,feeling confusion, fatigue since. Mom says she has been A&Ox4, pt is alert now and breathing easily.

## 2012-08-20 NOTE — ED Provider Notes (Signed)
Medical screening examination/treatment/procedure(s) were performed by non-physician practitioner and as supervising physician I was immediately available for consultation/collaboration.   Darlys Gales, MD 08/20/12 2053

## 2012-08-20 NOTE — ED Notes (Signed)
Discharge instructions reviewed with pt and mother. Both verbalized understanding.

## 2012-08-21 ENCOUNTER — Encounter: Payer: Self-pay | Admitting: Dietician

## 2012-08-21 ENCOUNTER — Encounter: Payer: 59 | Attending: Internal Medicine | Admitting: Dietician

## 2012-08-21 DIAGNOSIS — Z713 Dietary counseling and surveillance: Secondary | ICD-10-CM | POA: Insufficient documentation

## 2012-08-21 DIAGNOSIS — E669 Obesity, unspecified: Secondary | ICD-10-CM | POA: Insufficient documentation

## 2012-08-21 NOTE — Progress Notes (Signed)
Appointment aborted d/t patient answers in the Outpatient Screening Tool. Patient declined to go to [employer] and eft safely and escorted to car by police. See Outpatient Screening Tool in record.   Appointment rescheduled for 09/06/2012 at 12:00 PM. Patient given appointment card.

## 2012-08-22 ENCOUNTER — Telehealth (HOSPITAL_COMMUNITY): Payer: Self-pay | Admitting: Marriage and Family Therapist

## 2012-08-22 NOTE — Telephone Encounter (Signed)
Patient left a message to say that on 8/8 she got frustrated and banged her head on the wall leading to her going to the emergency room with a mild concussion.  Also received an email from her dietitian realting to same from Monday, 08/21/12. Assessed through a telephone call for suicidal ideation and patient states she is not suicidal and had not been suicidal when she hit her head, mainly frustrated.  She reports the trigger had to do with financial aid problems.  Patient does have an appointment this week and will discuss this situation with her.    Cleophas Dunker, LMFT, CTS Counselor

## 2012-08-24 ENCOUNTER — Ambulatory Visit (INDEPENDENT_AMBULATORY_CARE_PROVIDER_SITE_OTHER): Payer: 59 | Admitting: Marriage and Family Therapist

## 2012-08-24 DIAGNOSIS — F988 Other specified behavioral and emotional disorders with onset usually occurring in childhood and adolescence: Secondary | ICD-10-CM

## 2012-08-24 DIAGNOSIS — F331 Major depressive disorder, recurrent, moderate: Secondary | ICD-10-CM
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
## 2012-08-29 ENCOUNTER — Ambulatory Visit (HOSPITAL_COMMUNITY): Payer: Self-pay | Admitting: Marriage and Family Therapist

## 2012-09-05 ENCOUNTER — Ambulatory Visit: Payer: Self-pay | Admitting: Dietician

## 2012-09-06 ENCOUNTER — Ambulatory Visit (HOSPITAL_COMMUNITY): Payer: Self-pay | Admitting: Marriage and Family Therapist

## 2012-09-06 ENCOUNTER — Encounter: Payer: Self-pay | Admitting: Dietician

## 2012-09-06 ENCOUNTER — Encounter: Payer: 59 | Admitting: Dietician

## 2012-09-06 VITALS — Ht 61.0 in | Wt 178.9 lb

## 2012-09-06 DIAGNOSIS — E669 Obesity, unspecified: Secondary | ICD-10-CM

## 2012-09-06 NOTE — Patient Instructions (Addendum)
Choose a cereal with less than 10 g sugar per serving and more than 4 g fiber and check the serving size.  Fill half of your plate with vegetables. Recommend buying pre washed vegetables. Eat 3 meals per day and 2-3 snacks with protein. Eat every 3-5 hours you are awake.  Limit starches to a quarter of the plate.  Drink water or non-sugar beverages. Look for app or programs that have quick indoor workout such as 7 minute workout.  Continue planning out meals for the week.

## 2012-09-06 NOTE — Progress Notes (Signed)
  Medical Nutrition Therapy:  Appt start time: 1200 end time:  1300.   Assessment:  Primary concerns today: Dr. Elvera Lennox recommended that she talked to a dietitian about weight loss and pre-diabetes. States that she thinks she is missing nutrients in her diet including protein. States she lost 15 weights since July 8th by cutting down meat and dairy, eating more vegetables and fruit, drinking more water, and not eating fast food anymore. States she is not exercising and skips some meals during the week if she is sleeping or doesn't have an appetite from one of her medications.   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 States that she shares grocery shopping and food preparation with with mom (mostly).  MEDICATIONS: see list   DIETARY INTAKE:  24-hr recall:  B ( AM): Cereal with almond milk OR muffins OR skips 4 x week  Snk ( AM): not usually   L ( PM): ham and cheese on flatbread with carrots and mandarin oranges with 2 Capri suns. Snk ( PM): Fruit, potato chips with water or almond milk  D ( PM): meat with vegetables and rice with water or almond milk Snk ( PM): 1/2 peanut butter and jelly with almond milk  Beverages: water, almond, and 2 Capri Suns  Usual physical activity: none  Estimated energy needs: 2000 calories 225 g carbohydrates 150 g protein 56 g fat  Progress Towards Goal(s):  In progress.   Nutritional Diagnosis:  NB-1.1 Food and nutrition-related knowledge deficit As related to history of large portions, energy dense food choices, meal skipping, and lack of physical activity .  As evidenced by BMI of 33.8.    Intervention:  Nutrition counseling provided. Discussed the importance of eating 3 meals per day and 2-3 snacks to prevent overeating and aid in weight loss. Recommended finding some physical activity that she can do regularly that she enjoys doing.   Plan:  Choose a cereal with less than 10 g sugar per serving and more than 4 g fiber and check  the serving size.  Fill half of your plate with vegetables. Recommend buying pre washed vegetables. Eat 3 meals per day and 2-3 snacks with protein. Eat every 3-5 hours you are awake.  Limit starches to a quarter of the plate.  Drink water or non-sugar beverages. Look for app or programs that have quick indoor workout such as 7 minute workout.  Continue planning out meals for the week.   Handouts given during visit include:  MyPlate Handout  19J CHO Snacks  Monitoring/Evaluation:  Dietary intake, exercise, and body weight in 6 week(s).

## 2012-09-07 ENCOUNTER — Encounter (HOSPITAL_COMMUNITY): Payer: Self-pay | Admitting: Physician Assistant

## 2012-09-07 ENCOUNTER — Ambulatory Visit (INDEPENDENT_AMBULATORY_CARE_PROVIDER_SITE_OTHER): Payer: 59 | Admitting: Physician Assistant

## 2012-09-07 VITALS — BP 115/78 | HR 103 | Ht 62.0 in | Wt 181.2 lb

## 2012-09-07 DIAGNOSIS — F331 Major depressive disorder, recurrent, moderate: Secondary | ICD-10-CM

## 2012-09-07 DIAGNOSIS — F411 Generalized anxiety disorder: Secondary | ICD-10-CM

## 2012-09-07 DIAGNOSIS — F988 Other specified behavioral and emotional disorders with onset usually occurring in childhood and adolescence: Secondary | ICD-10-CM

## 2012-09-07 MED ORDER — METHYLPHENIDATE 20 MG/9HR TD PTCH: 1.0000 | MEDICATED_PATCH | Freq: Every day | TRANSDERMAL | Status: DC

## 2012-09-07 NOTE — Progress Notes (Signed)
[facility] [id number] Progress Note  [name] [id number] 22 y.o.  09/07/2012 3:05 PM  Chief Complaint: Increased mood lability and suicidal thoughts  History of Present Illness: [name] presents today to followup on her treatment for anxiety and depression, as well as ADHD. She reports that she feels she is getting unstable. She reports that she has been having suicidal thoughts of cutting or hanging herself, but she denies any intention, and is able to contract for safety. She denies any homicidal ideation or auditory or visual hallucinations. She reports that since the medication changes made at her last appointment her mood is more labile, she is increasing only irritable, and more sensitive. She feels that she is sleeping better since we changed the Remeron to trazodone. She states that she still wakes up at times. She has lost about 10 pounds over the last 6 weeks that she started a new diet. She is trying to become vegan. She also complains that the Vyvanse does not last long enough, and when it wears off she seems to "crash." She would like to try a day, as her brother is on that medication. She has started back to school and she is currently taking 2 classes.  Suicidal Ideation: Yes Plan Formed: Yes Patient has means to carry out plan: Yes  Homicidal Ideation: No Plan Formed: No Patient has means to carry out plan: No  Review of Systems: Psychiatric: Agitation: Yes Hallucination: No Depressed Mood: Yes Insomnia: No Hypersomnia: No Altered Concentration: No Feels Worthless: No Grandiose Ideas: No Belief In Special Powers: No New/Increased Substance Abuse: No Compulsions: No  Neurologic: Headache: No Seizure: No Paresthesias: No  Past Medical History:  Past Medical History   Diagnosis  Date   .  Depression    .  Anxiety    .  ADHD (attention deficit hyperactivity disorder)    .  Obesity    .  GERD (gastroesophageal reflux disease)    .  Environmental  allergies    .  Myopia of both eyes     Social History:  [name] was born in [place], [place] and she currently lives with her mother, father, and her younger brother. She has an older brother who lives in [place]. [place]. She graduated high school. She is currently attending [employer] studying biology. She plans to return to [employer] to complete a degree in marine biology. She is currently unemployed. 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 She denies any history of legal problems. She reports that her social support system consists of her therapist. She enjoys playing video games, but watching, cocaine, sewing, watching nature documentaries, and hiking.  Family History:  Family History   Problem  Relation  Age of Onset   .  Bipolar disorder  Father    .  Bipolar disorder  Brother    .  Depression  Mother    .  Bipolar disorder  Paternal Aunt     Outpatient Encounter Prescriptions as of 09/07/2012  Medication Sig Dispense Refill  . acetaminophen (TYLENOL) 500 MG tablet Take 500 mg by mouth every 6 (six) hours as needed for pain.      Marland Kitchen ALPRAZolam (XANAX) 0.5 MG tablet Take 0.5 mg by mouth as needed for sleep.      Marland Kitchen lamoTRIgine (LAMICTAL) 200 MG tablet Take 100-200 mg by mouth See admin instructions. Alternates every other night. Last dose was 0.5 tablet      . lisdexamfetamine (VYVANSE) 70 MG capsule Take 70 mg  by mouth daily as needed (only takes during school year).       . meclizine (ANTIVERT) 50 MG tablet Take 0.5 tablets (25 mg total) by mouth 3 (three) times daily as needed for dizziness or nausea.  30 tablet  0  . methylphenidate (DAYTRANA) 20 MG/9HR Place 1 patch onto the skin daily. wear patch for 9 hours only each day  30 patch  0  . Naproxen Sodium (ALEVE PO) Take 2 tablets by mouth daily as needed (for pain).      . norgestimate-ethinyl estradiol (SPRINTEC 28) 0.25-35 MG-MCG tablet Take 1 tablet by mouth daily.      . sertraline (ZOLOFT) 100 MG tablet Take 100 mg by mouth daily.       . traZODone (DESYREL) 50 MG tablet Take 1.5 tablets (75 mg total) by mouth at bedtime.  45 tablet  1   No facility-administered encounter medications on file as of 09/07/2012.    Past Psychiatric History/Hospitalization(s): Anxiety: Yes Bipolar Disorder: No Depression: Yes Mania: No Psychosis: No Schizophrenia: No Personality Disorder: No Hospitalization for psychiatric illness: Yes History of Electroconvulsive Shock Therapy: No Prior Suicide Attempts: Yes  Physical Exam: Constitutional:  BP 115/78  Pulse 103  Ht 5\' 2"  (1.575 m)  Wt 181 lb 3.2 oz (82.192 kg)  BMI 33.13 kg/m2  General Appearance: alert, oriented, no acute distress, well nourished and casually dressed  Musculoskeletal: Strength & Muscle Tone: within normal limits Gait & Station: normal Patient leans: N/A  Psychiatric: Speech (describe rate, volume, coherence, spontaneity, and abnormalities if any): Clear and coherent at a rate a rate and rhythm and normal volume  Thought Process (describe rate, content, abstract reasoning, and computation): Within normal limits  Associations: Intact  Thoughts: suicidal ideation  Mental Status: Orientation: oriented to person, place, time/date and situation Mood & Affect: normal affect Attention Span & Concentration: Intact  Medical Decision Making (Choose Three): Review of Psycho-Social Stressors (1), Established Problem, Worsening (2), Review of Medication Regimen & Side Effects (2) and Review of New Medication or Change in Dosage (2)  Assessment: Axis I: Maj. depressive disorder, recurrent, moderate; generalized anxiety disorder; ADHD, inattentive type  Axis II: Deferred  Axis III: GERD, obesity, environmental allergies, myopia  Axis IV: Problems related to the social environment and problems with her primary support group  Axis V: 55   Plan: We will increase her Lamictal back to 200 mg daily, and discontinue the Vyvanse and start her on a Daytrana 20  mg daily. In 3 weeks if her mood is not more stable as evidenced by less irritability and less sensitiveness, we'll increase her Zoloft to 150 mg daily. We will continue her Xanax 0.5 mg 3 times daily as needed, and trazodone 50 mg at bedtime. She will return for followup in approximately 6 weeks.  Maryam Feely, PA-C 09/07/2012

## 2012-09-14 ENCOUNTER — Ambulatory Visit (HOSPITAL_COMMUNITY): Payer: Self-pay | Admitting: Marriage and Family Therapist

## 2012-09-14 ENCOUNTER — Ambulatory Visit (INDEPENDENT_AMBULATORY_CARE_PROVIDER_SITE_OTHER): Payer: 59 | Admitting: Marriage and Family Therapist

## 2012-09-14 ENCOUNTER — Encounter (HOSPITAL_COMMUNITY): Payer: Self-pay

## 2012-09-14 DIAGNOSIS — F332 Major depressive disorder, recurrent severe without psychotic features: Secondary | ICD-10-CM

## 2012-09-14 DIAGNOSIS — F988 Other specified behavioral and emotional disorders with onset usually occurring in childhood and adolescence: Secondary | ICD-10-CM
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
## 2012-10-04 ENCOUNTER — Ambulatory Visit (HOSPITAL_COMMUNITY): Payer: Self-pay | Admitting: Marriage and Family Therapist

## 2012-10-10 ENCOUNTER — Other Ambulatory Visit (HOSPITAL_COMMUNITY): Payer: Self-pay | Admitting: Physician Assistant

## 2012-10-11 ENCOUNTER — Other Ambulatory Visit (HOSPITAL_COMMUNITY): Payer: Self-pay | Admitting: Physician Assistant

## 2012-10-11 ENCOUNTER — Ambulatory Visit (INDEPENDENT_AMBULATORY_CARE_PROVIDER_SITE_OTHER): Payer: 59 | Admitting: Marriage and Family Therapist

## 2012-10-11 DIAGNOSIS — F331 Major depressive disorder, recurrent, moderate: Secondary | ICD-10-CM

## 2012-10-11 DIAGNOSIS — F988 Other specified behavioral and emotional disorders with onset usually occurring in childhood and adolescence: Secondary | ICD-10-CM

## 2012-10-11 MED ORDER — METHYLPHENIDATE 30 MG/9HR TD PTCH: 1.0000 | MEDICATED_PATCH | Freq: Every day | TRANSDERMAL | Status: DC
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
## 2012-10-18 ENCOUNTER — Ambulatory Visit (INDEPENDENT_AMBULATORY_CARE_PROVIDER_SITE_OTHER): Payer: 59 | Admitting: Marriage and Family Therapist

## 2012-10-18 ENCOUNTER — Encounter: Payer: 59 | Attending: Internal Medicine | Admitting: Dietician

## 2012-10-18 VITALS — Ht 61.0 in | Wt 173.3 lb

## 2012-10-18 DIAGNOSIS — F988 Other specified behavioral and emotional disorders with onset usually occurring in childhood and adolescence: Secondary | ICD-10-CM

## 2012-10-18 DIAGNOSIS — E669 Obesity, unspecified: Secondary | ICD-10-CM | POA: Insufficient documentation

## 2012-10-18 DIAGNOSIS — Z713 Dietary counseling and surveillance: Secondary | ICD-10-CM | POA: Insufficient documentation

## 2012-10-18 DIAGNOSIS — F331 Major depressive disorder, recurrent, moderate: Secondary | ICD-10-CM

## 2012-10-18 NOTE — Patient Instructions (Signed)
Choose a cereal with less than 10 g sugar per serving and more than 4 g fiber and check the serving size.  Fill half of your plate with vegetables. Recommend buying pre washed vegetables. Eat 3 meals per day and 2-3 snacks with protein. Eat every 3-5 hours you are awake.  Limit starches to a quarter of the plate.  Look for app or programs that have quick indoor workout such as 7 minute workout.  Continue planning out meals for the week.

## 2012-10-18 NOTE — Progress Notes (Signed)
  Medical Nutrition Therapy:  Appt start time: 1200 end time:  1300.   Assessment:  Primary concerns today: [name] returns today for a follow up for weight loss. She lost 4.7 pounds since her last visit 6 weeks ago. States that she has been eating meat and would like to be a vegetarian again. States that she felt better as a vegetarian, had less "shakes", more energy, better digestion, and "less sluggishness".  States that she doesn't feel like she's made progress and is "slipping back" on her diet since she is having fast food and meat.    Wt Readings from Last 3 Encounters:  10/18/12 173 lb 4.8 oz (78.608 kg)  09/07/12 181 lb 3.2 oz (82.192 kg)  09/06/12 178 lb 14.4 oz (81.149 kg)   Ht Readings from Last 3 Encounters:  10/18/12 5\' 1"  (1.549 m)  09/07/12 5\' 2"  (1.575 m)  09/06/12 5\' 1"  (1.549 m)   Body mass index is 32.76 kg/(m^2). @BMIFA @ Normalized weight-for-age data available only for age 76 to 20 years. Normalized stature-for-age data available only for age 76 to 20 years.  MEDICATIONS: see list   DIETARY INTAKE:  24-hr recall:  B ( AM): Protein Cheerios Cereal with 2% milk OR muffins OR skips 4 x week  Snk ( AM): not usually or apples  L ( PM): ham and cheese on flatbread with carrots and mandarin oranges with water Snk ( PM):apples, 1/2 sandwich, peanut butter crackers D ( PM): meat with vegetables and rice with water  Snk ( PM): none Beverages: water  Usual physical activity: none  Estimated energy needs: 2000 calories 225 g carbohydrates 150 g protein 56 g fat  Progress Towards Goal(s):  In progress.   Nutritional Diagnosis:  NB-1.1 Food and nutrition-related knowledge deficit As related to history of large portions, energy dense food choices, meal skipping, and lack of physical activity .  As evidenced by BMI of 33.8.    Intervention:  Nutrition counseling provided. Discussed adding more vegetables to her diet and vegetarian sources of protein to include in  her diet.   Plan: Choose a cereal with less than 10 g sugar per serving and more than 4 g fiber and check the serving size.  Fill half of your plate with vegetables. Recommend buying pre washed vegetables. Eat 3 meals per day and 2-3 snacks with protein. Eat every 3-5 hours you are awake.  Limit starches to a quarter of the plate.  Look for app or programs that have quick indoor workout such as 7 minute workout.  Continue planning out meals for the week.   Handouts given during visit include:  Vegan Protein Sources  Monitoring/Evaluation:  Dietary intake, exercise, and body weight in 2 months
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
## 2012-10-25 ENCOUNTER — Telehealth (HOSPITAL_COMMUNITY): Payer: Self-pay | Admitting: Marriage and Family Therapist

## 2012-10-25 ENCOUNTER — Ambulatory Visit (HOSPITAL_COMMUNITY): Payer: Self-pay | Admitting: Marriage and Family Therapist

## 2012-10-25 NOTE — Telephone Encounter (Signed)
Patient called to say she was sick and could not come in for her appointment.  Cleophas Dunker, LMFT, CTS

## 2012-10-30 ENCOUNTER — Ambulatory Visit (HOSPITAL_COMMUNITY): Payer: Self-pay | Admitting: Physician Assistant

## 2012-11-01 ENCOUNTER — Ambulatory Visit (HOSPITAL_COMMUNITY): Payer: Self-pay | Admitting: Marriage and Family Therapist

## 2012-11-08 ENCOUNTER — Ambulatory Visit (INDEPENDENT_AMBULATORY_CARE_PROVIDER_SITE_OTHER): Payer: 59 | Admitting: Marriage and Family Therapist

## 2012-11-08 DIAGNOSIS — F331 Major depressive disorder, recurrent, moderate: Secondary | ICD-10-CM

## 2012-11-08 DIAGNOSIS — F988 Other specified behavioral and emotional disorders with onset usually occurring in childhood and adolescence: Secondary | ICD-10-CM

## 2012-11-08 NOTE — Progress Notes (Signed)
   THERAPIST PROGRESS NOTE  Session Time:  3:00 - 4:00 p.m.  Participation Level: Active  Behavioral Response: CasualAlertAngry  Type of Therapy: Individual Therapy  Treatment Goals addressed: Coping  Interventions: Strength-based and Supportive  Summary: [name] is a 22 y.o. female who presents with depression and ADHD.  She was referred by Dr. Ladona Ridgel.  Patient stated she was doing fairly well with the exception of an incident that happened with her brother.  Patient states she wants to do her art work in her brother's room since there was more sunlight in that room and came across patient's old underwear and bras as well as underwear that belong to patient's mother.  She reported one of her bras has "pointy balls in them to simulate breasts."  Patient states she is very angry at her brother, and feels "violated."  She reports wanting to take showers because she "feels so dirty."  She told her parents about what she found however patient believes her parents are not taking this situation seriously, stating it was normal female adolescent behavior.  Patient reports she "knows her parents will not be there for her" so she took some of her tuition money and bought a television for her room.  She reports she will keep it locked at all times now so her brother cannot get into her room.  She is thinking of talking to her psychology professor about this and requested feedback from this Clinical research associate.  As far as classes, patient states she is keeping up with her classes and will try to talk to someone at [facility] for September semester.     Suicidal/Homicidal: Nowithout intent/plan  Therapist Response:  Discussed the entire session patient's incident with her brother.  First discussed the idea that he is a 22 year-old sexual boy who may have used what was accessible to him, his sister's and mother's things for sexual gratification.  But also discussed that patient feeling violated and being angry was  very appropriate because an important boundary was violated.  Talked about this not being her fault.  Talked about how she will cope or what consequences will occur if this happens again.  Patient reports staying in her room will be a short-term option but she does not know what to do in the future since her parents are not taking the incident seriously, mother more so than father.  Suggested she consider having her older brother know if she felt that she would get support from him.  Patient states that her father should tell the older brother.  Discussed patient's taking control of the situation for herself from a strength-based approach.  Will continue to discuss this incident - although patient stated she felt better after the session she did say she believes the situation is now over for her.  Plan: Return again in 1 weeks.  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

## 2012-11-10 ENCOUNTER — Other Ambulatory Visit (HOSPITAL_COMMUNITY): Payer: Self-pay | Admitting: Physician Assistant

## 2012-11-16 ENCOUNTER — Other Ambulatory Visit: Payer: Self-pay

## 2012-11-22 ENCOUNTER — Ambulatory Visit (INDEPENDENT_AMBULATORY_CARE_PROVIDER_SITE_OTHER): Payer: 59 | Admitting: Marriage and Family Therapist

## 2012-11-22 DIAGNOSIS — F988 Other specified behavioral and emotional disorders with onset usually occurring in childhood and adolescence: Secondary | ICD-10-CM

## 2012-11-22 DIAGNOSIS — F331 Major depressive disorder, recurrent, moderate: Secondary | ICD-10-CM

## 2012-11-23 NOTE — Progress Notes (Signed)
   THERAPIST PROGRESS NOTE  Session Time: 3:00 - 4:00 p.m.  Participation Level: Active  Behavioral Response: CasualAlertAnxious (mild)  Type of Therapy: Individual Therapy  Treatment Goals addressed: Coping  Interventions: Strength-based and Supportive  Summary: [name] is a 22 y.o. female who presents with depression and ADHD.  She was referred by Dr. Ladona Ridgel.  Patient reports she has been anxious over "realizing this semester is almost over" and she has not done the work she needs to do.  Patient admitted there are times when "I don't feel like it and would rather play video games."  Patient also states her mother has been pressuring her to try to get back into [employer] but patient does not feel ready and wants to find a job.  She reports things at home have been stressful with her brother "constantly bothering me" and "my father is in his manic stage staying up all night keeping me awake."  Patient states since she got a TV in her room she has been able to cope with the situation better because she can "get away from the stress."  She reports she has continued to get together with her group of friends that meet at [facility] [employer].   Suicidal/Homicidal:  Not assessed in this session.  Therapist Response:  Talked about patient procrastinating with school.  Talked about her lack of motivation and the idea that patient procrastinating is not uncomfortable enough for her to stop.  Talked about how she is coping at home and at school and how "hiding in her room" is not healthy.  Discussed patient now having a support system for the first time since she was a child.  Plan: Return again in 1 weeks.  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

## 2012-12-06 ENCOUNTER — Ambulatory Visit (INDEPENDENT_AMBULATORY_CARE_PROVIDER_SITE_OTHER): Payer: 59 | Admitting: Marriage and Family Therapist

## 2012-12-06 DIAGNOSIS — F988 Other specified behavioral and emotional disorders with onset usually occurring in childhood and adolescence: Secondary | ICD-10-CM

## 2012-12-06 DIAGNOSIS — F331 Major depressive disorder, recurrent, moderate: Secondary | ICD-10-CM

## 2012-12-06 NOTE — Progress Notes (Signed)
   THERAPIST PROGRESS NOTE  Session Time:  3:00 - 4:00 p.m.  Participation Level: Active  Behavioral Response: CasualAlertDepressed  Type of Therapy: Individual Therapy  Treatment Goals addressed: Coping  Interventions: Strength-based and Supportive  Summary: [name] is a 22 y.o. female who presents with depression and ADHD.  She was referred by Dr. Ladona Ridgel.   Patient reports she has been feeling depressed and overwhelmed.  She reports feeling depressed because of family arguments and crisis.  She states she is tired of living at home and "embarrassed" at what her family is like.  She reports also having difficulty studying and doing school projects at home due to the chaos and arguing.  She states she is overwhelmed because of school and finals.  Suicidal/Homicidal: Nowithout intent/plan  Therapist Response:  Discussed the difficulties patient has experienced in her home.  Pointed out to her she has a father with a significant mental health history and a mother that is depressed.  Also pointed out to patient that none of it is her fault, and gave her the Alanon saying, "I didn't cause it; I can't control it; and I can't cure it."  Explained to patient what "learned helplessness" is and gave several examples of how patient shows this type of thinking and behavior.  Patient was able to say that after she finishes exams she will have a break and will have a Thanksgiving break from school as well.    Plan: Return again in 1 weeks.  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

## 2012-12-11 ENCOUNTER — Other Ambulatory Visit (HOSPITAL_COMMUNITY): Payer: Self-pay | Admitting: Physician Assistant

## 2012-12-11 DIAGNOSIS — F331 Major depressive disorder, recurrent, moderate: Secondary | ICD-10-CM

## 2012-12-12 NOTE — Telephone Encounter (Deleted)
Chart reviewed Refill appropriate Appt with Dr. Donell Beers  02/14/13

## 2012-12-12 NOTE — Telephone Encounter (Signed)
Will review refill request with Dr.Plovsky

## 2012-12-20 ENCOUNTER — Ambulatory Visit (INDEPENDENT_AMBULATORY_CARE_PROVIDER_SITE_OTHER): Payer: 59 | Admitting: Marriage and Family Therapist

## 2012-12-20 DIAGNOSIS — F331 Major depressive disorder, recurrent, moderate: Secondary | ICD-10-CM

## 2012-12-20 DIAGNOSIS — F988 Other specified behavioral and emotional disorders with onset usually occurring in childhood and adolescence: Secondary | ICD-10-CM

## 2012-12-20 NOTE — Progress Notes (Signed)
   THERAPIST PROGRESS NOTE  Session Time:  3:00 - 4:00 p.m.  Participation Level: Active  Behavioral Response: CasualAlertDepressed (mild)  Type of Therapy: Individual Therapy  Treatment Goals addressed: Coping  Interventions: Strength-based and Supportive  Summary: Carolynne Schuchard is a 22 y.o. female who presents with depression and ADHD.  She was referred by Dr. Ladona Ridgel.   Patient reports that she will be finished with school tomorrow for this semester.  She reports being focused on finishing her classes and now is feeling "very tired."  She reports much of her depression continues to be about how her family continues to be "chaotic and insensitive of my needs."  Patient gave examples including mother getting a bonus from work and spent it on her two brothers, that she asked the family to please be quiet at night because she had to study and did not.  She reports and has also in the past that her brother and father are "up all night making noise, cooking and doing whatever."  Suicidal/Homicidal: No  Therapist Response:   Patient continues to have depression relating to her family experience but this time expresses knowing that her family will never change and feeling "stuck."  Told her to please not stop considering how important her college is to her own mental health and how to get her own place.  Continued to discuss how she can cope living there including spending the time away from home with her new friends and also spending more time in school.  Will continue to support patient in the need to find ways to cope with her home life.  Plan: Return again in 1 weeks.  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

## 2012-12-28 ENCOUNTER — Encounter: Payer: 59 | Attending: Internal Medicine | Admitting: Dietician

## 2012-12-28 VITALS — Ht 61.0 in | Wt 166.4 lb

## 2012-12-28 DIAGNOSIS — E669 Obesity, unspecified: Secondary | ICD-10-CM | POA: Insufficient documentation

## 2012-12-28 DIAGNOSIS — Z713 Dietary counseling and surveillance: Secondary | ICD-10-CM | POA: Insufficient documentation

## 2012-12-28 NOTE — Progress Notes (Signed)
  Medical Nutrition Therapy:  Appt start time: 1200 end time:  1230.   Assessment:  Primary concerns today: [name] returns today for a follow up for weight loss. She lost about 7 lbs since her last visit, though she gained 4 lbs in the past week. Starting eating hot breakfast which helps her from getting hungry too soon.   Still feels like her diet has "slipped" and is getting shaky. Planning to focus diet exercise during break. States that she doesn't feel like she's made progress and is "slipping back" on her diet since she is having fast food and meat.   Wt Readings from Last 3 Encounters:  12/28/12 166 lb 6.4 oz (75.479 kg)  10/18/12 173 lb 4.8 oz (78.608 kg)  09/07/12 181 lb 3.2 oz (82.192 kg)   Ht Readings from Last 3 Encounters:  12/28/12 5\' 1"  (1.549 m)  10/18/12 5\' 1"  (1.549 m)  09/07/12 5\' 2"  (1.575 m)   Body mass index is 31.46 kg/(m^2). @BMIFA @ Normalized weight-for-age data available only for age 60 to 20 years. Normalized stature-for-age data available only for age 60 to 20 years.  MEDICATIONS: see list   DIETARY INTAKE:  24-hr recall:  B ( AM): bacon, egg, and cheese biscuit with milk or water Snk ( AM): not usually  L ( PM): salad with egg, bacon, "bacon grease" dressing with water Snk ( PM):apples or other fruit with cheese D ( PM): meat with vegetables and rice with water  Snk ( PM): none Beverages: water  Usual physical activity: bike riding 1 x week 30 minutes  Estimated energy needs: 2000 calories 225 g carbohydrates 150 g protein 56 g fat  Progress Towards Goal(s):  In progress.   Nutritional Diagnosis:  NB-1.1 Food and nutrition-related knowledge deficit As related to history of large portions, energy dense food choices, meal skipping, and lack of physical activity .  As evidenced by BMI of 33.8.    Intervention:  Nutrition counseling provided. Discussed adding more vegetables to her diet and working on exercising while she is on break for school.     Plan: Fill half of your plate with vegetables. Eat 3 meals per day and 2-3 snacks with protein. Eat every 3-5 hours you are awake.  Limit starches to a quarter of the plate.  Aim to exercise 3-4 days/week 30-60 minutes (walking/cycling).  Start planning out meals for the week.   Great job losing [employer]!!  Monitoring/Evaluation:  Dietary intake, exercise, and body weight in 3 months

## 2012-12-28 NOTE — Patient Instructions (Addendum)
Fill half of your plate with vegetables. Eat 3 meals per day and 2-3 snacks with protein. Eat every 3-5 hours you are awake.  Limit starches to a quarter of the plate.  Aim to exercise 3-4 days/week 30-60 minutes (walking/cycling).  Start planning out meals for the week.   Great job losing weight!!

## 2013-01-01 ENCOUNTER — Ambulatory Visit (INDEPENDENT_AMBULATORY_CARE_PROVIDER_SITE_OTHER): Payer: 59 | Admitting: Marriage and Family Therapist

## 2013-01-01 DIAGNOSIS — F988 Other specified behavioral and emotional disorders with onset usually occurring in childhood and adolescence: Secondary | ICD-10-CM

## 2013-01-01 DIAGNOSIS — F331 Major depressive disorder, recurrent, moderate: Secondary | ICD-10-CM

## 2013-01-01 NOTE — Progress Notes (Signed)
   THERAPIST PROGRESS NOTE  Session Time:  3:00 - 4:00 p.m.  Participation Level: Active  Behavioral Response: CasualAlertDepressed/overwhelmed  Type of Therapy: Individual Therapy  Treatment Goals addressed: Coping  Interventions: Strength-based and Supportive  Summary: [name] is a 22 y.o. female who presents with depression and ADHD.  She was referred by Dr. Ladona Ridgel.   Patient reports she "had a meltdown" yesterday.  She reports continuing to be depressed because of her family situation.  She reports bringing her brother to the session to stay in the waiting room because "her father blew up" and she did not want to leave him alone with their father.  Patient admits that father has been abusing his sleep medication and has been abusing wine as well for some time.  She reports "my mother finally got honest with me about what he was doing."  She reports having apprehension over the holidays as the holidays are "always awful."  She also reports her parents have been arguing over money.  She did report her older brother and his fiance were coming for Christmas.  She states she does not want the fiance to "see what it is like at my house."  Patient state she is now out of academic probation and her grades are good.  Patient also reports she spent time with her group of friends at [employer] this past weekend.  Suicidal/Homicidal: Nowithout intent/plan  Therapist Response:   Assessed patient's depression.  Talked about her depression being situational.  Pointed out to patient that she has been trying to do things that distract her from her home life.  From that discussion she was able to identify that her mother and patient have been watching a show on her TV in her room.  She report she finds this event "very helpful."  Talked about patient developing a plan relating to coping during Christmas.  She was able to identify "getting in the car and getting out of the house" as something she  likes to do.  Plan: Return again in 1 weeks.  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

## 2013-01-08 ENCOUNTER — Ambulatory Visit (INDEPENDENT_AMBULATORY_CARE_PROVIDER_SITE_OTHER): Payer: 59 | Admitting: Marriage and Family Therapist

## 2013-01-08 DIAGNOSIS — F331 Major depressive disorder, recurrent, moderate: Secondary | ICD-10-CM

## 2013-01-08 DIAGNOSIS — F988 Other specified behavioral and emotional disorders with onset usually occurring in childhood and adolescence: Secondary | ICD-10-CM

## 2013-01-10 ENCOUNTER — Ambulatory Visit (HOSPITAL_COMMUNITY): Payer: Self-pay | Admitting: Marriage and Family Therapist

## 2013-01-16 NOTE — Progress Notes (Signed)
   THERAPIST PROGRESS NOTE  Session Time:  3:00 - 4:00 p.m.  Participation Level: Active  Behavioral Response: CasualAlertAnxious  Type of Therapy: Individual Therapy  Treatment Goals addressed: Coping  Interventions: Strength-based and Supportive/family systems  Summary: [name] is a 23 y.o. female who presents with depression and ADHD.  She was referred by Dr. Ladona Ridgelaylor.  Patient requested her brother, Timothy LassoZack, come in for part of the session.  She signed a release for him.  Both report there has been a lot of stress during the holidays and they want their mother to divorce their father.  They reported having a discussion with their older brother about how things have been going and all agree that their parents need to divorce.  Patient requested some resources for her mother and she wanted to have her mother in for this session to discuss what patient thinks about her home life and to get mother help.     Suicidal/Homicidal:  Patient was not assessed for this session.   Therapist Response:  Established boundaries relating to patient wanting her mother in regard to patient's session being used for same.  Did give patient some resources including [facility] and local support groups.  Requested patient attend them to receive resources for herself.  Listened to both patient and brother and gave feedback relating to what they had control over and what they did not have control over.  Plan: Return again in 1 weeks.  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

## 2013-01-17 ENCOUNTER — Ambulatory Visit (INDEPENDENT_AMBULATORY_CARE_PROVIDER_SITE_OTHER): Payer: 59 | Admitting: Marriage and Family Therapist

## 2013-01-17 DIAGNOSIS — F331 Major depressive disorder, recurrent, moderate: Secondary | ICD-10-CM

## 2013-01-17 DIAGNOSIS — F988 Other specified behavioral and emotional disorders with onset usually occurring in childhood and adolescence: Secondary | ICD-10-CM

## 2013-01-17 NOTE — Progress Notes (Signed)
   THERAPIST PROGRESS NOTE  Session Time:  2:00 - 3:00 p.m.  Participation Level: Active  Behavioral Response: CasualAlert  Type of Therapy: Individual Therapy  Treatment Goals addressed: Coping  Interventions: Strength-based and Supportive  Summary: [name] is a 23 y.o. female who presents with depression and ADHD.  She was referred by Dr. Ladona Ridgelaylor.  Patient reports she is doing well since last week.  She reports "things have calmed down at home" so there is no need to try to help her mother.  She reports having her brother there was calming for everyone and therefore patient did not need the resources given to her in the last session.  She reports she will be starting school tomorrow and is now focused on school.  She reports she is now looking for a job and has put in about 10 job applications in various places.  Patient stated "not much is going on so I don't have much to talk about today."  Suicidal/Homicidal:  Patient not assessed in this session.  Therapist Response:  Talked about patient's home situation relating to chaos with the family.  Pointed out to patient that she seems to be getting too involved in her parents' marriage problems and that it is recommended that she lean ways to separate herself emotionally and focus on her own life instead, especially since she is now in her 620's.  Patient did not respond to this idea but this writer will be addressing this idea again with patient.  Plan: Return again in 1 weeks.  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

## 2013-01-18 ENCOUNTER — Ambulatory Visit: Payer: Self-pay | Admitting: Internal Medicine

## 2013-01-24 ENCOUNTER — Ambulatory Visit (INDEPENDENT_AMBULATORY_CARE_PROVIDER_SITE_OTHER): Payer: 59 | Admitting: Marriage and Family Therapist

## 2013-01-24 DIAGNOSIS — F988 Other specified behavioral and emotional disorders with onset usually occurring in childhood and adolescence: Secondary | ICD-10-CM

## 2013-01-24 DIAGNOSIS — F331 Major depressive disorder, recurrent, moderate: Secondary | ICD-10-CM

## 2013-01-24 NOTE — Progress Notes (Signed)
   THERAPIST PROGRESS NOTE  Session Time:  3:00 - 4:00 p.m.  Participation Level: Active  Behavioral Response: CasualAlertDepressed (moderate)  Type of Therapy: Individual Therapy  Treatment Goals addressed: Coping  Interventions: Strength-based and Supportive  Summary: [name] is a 23 y.o. female who presents with depression and ADHD.  She was referred by Dr. Ladona Ridgelaylor.   Patient reports she has been struggling with some depression and ADHD symptoms of feeling distracted.  She also reports going off of her ADHD medication because her patch was causing side effects like burning and skin irritation.  She also said the Vivance has caused side effects in the past so she no longer takes Vivance.  She reported "I think it is ruining my body."  Patient also states she started school and talked about her classes.  She talked about applying for jobs and having a job interview with [employer] but thinks she "messed up the interview."  She talked about what is happening at home.  Patient states her parents are going to be seeing a marriage/ family therapist tomorrow.  She states although she feels guilty for confronting her mother to do something about the chaos in the home her mother was the one who made the decision to see a therapist.  Patient stated, "I don't believe anything will change and I wish I lived with another family."  Suicidal/Homicidal: No  Therapist Response:   Patient was more depressed than last week based on self-report.  Talked about problems with ADHD medications and explained that sometimes people get into adulthood and outgrow the diagnosis, but also patient cannot tell how bad her ADHD is because of the stresses of her home environment.  Talked about her not having an appointment with our psychiatrist until the middle of February and told her there is an option of seeing an NP who just started working here.  Talked about her job interviews with respect that she has never been on a job  interview before and it will take lots of practice.  Also talked about the idea that it may take a while for her to get a job.  Discussed the home situation relating to her father having severe bipolar disorder and her mother experiencing learned helplessness as a result.  Worked to help patient see her father in a more realistic light e.g., "think about what it is like for father when he is in a manic psychotic episode."  Patient was able to identify by stating there are times when she experiences "a lot of self loathing."  Talked about the idea of homeostasis relating to family systems in her family e.g., rigidly trying to keep a dysfunctional system going because it is a familiar system but also told patient that mother decision on therapy after her children confronted her may be why she is doing it in the first place.  Also recommended that if she is required to be part of the therapy to go to one or two sessions and focus on her continuing to focus on herself to individualize from the family.  Plan: Return again in 1 weeks.  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

## 2013-01-26 ENCOUNTER — Ambulatory Visit (INDEPENDENT_AMBULATORY_CARE_PROVIDER_SITE_OTHER): Payer: 59 | Admitting: Internal Medicine

## 2013-01-26 ENCOUNTER — Encounter: Payer: Self-pay | Admitting: Internal Medicine

## 2013-01-26 VITALS — BP 104/58 | HR 89 | Temp 98.4°F | Resp 12 | Wt 167.0 lb

## 2013-01-26 DIAGNOSIS — E663 Overweight: Secondary | ICD-10-CM | POA: Insufficient documentation

## 2013-01-26 NOTE — Progress Notes (Signed)
Subjective:     Patient ID: [name], female   DOB: 01/24/1990, 23 y.o.   MRN: [id number]  HPI [name] is a 23 y.o. woman returning for f/u for weight gain.  She lost 30 lbs with the plant-based diet! She is now eating more meat, but did not gain weight back. She cut out sodas and is mindful of what she is eating. She cut out fast foods. She works with the nutritionist and is happy with the results.  She had with severe dysmenorrhea and menorrhagia. Her menstrual cycles were also irregular >> on OCP (now Sprintec). Pain and bleeding is much better. She takes them continuously.  At last visit, we checked TFTs and they were normal: Component     Latest Ref Rng 07/18/2012  TSH     0.35 - 5.50 uIU/mL 3.10  Free T4     0.60 - 1.60 ng/dL [id number]  T3, Free     2.3 - 4.2 pg/mL 2.7   We also checked a HbA1c and this was in the prediabetic range:  Lab Results  Component Value Date   HGBA1C 5.8 07/18/2012   I reviewed pt's medications, allergies, PMH, social hx, family hx and no changes required, except as mentioned above.   Review of Systems Constitutional: + weight loss, + fatigue, + hot flushes Eyes: no blurry vision, no xerophthalmia ENT: + sore throat, no nodules palpated in throat, no dysphagia/odynophagia, no hoarseness Cardiovascular: + CP/+ SOB/palpitations/leg swelling Respiratory: no cough/+ SOB Gastrointestinal: +N/no V/+ D/no C/+ GERD Musculoskeletal: + muscle aches/no joint aches Skin: no rashes except stretch marks; + hair loss Neurological: no tremors/numbness/tingling/dizziness, + HA  Objective:   Physical Exam BP 104/58  Pulse 89  Temp(Src) 98.4 F (36.9 C) (Oral)  Resp 12  Wt 167 lb (75.751 kg)  SpO2 97% Wt Readings from Last 3 Encounters:  01/26/13 167 lb (75.751 kg)  12/28/12 166 lb 6.4 oz (75.479 kg)  10/18/12 173 lb 4.8 oz (78.608 kg)   Constitutional: overweight, in NAD Eyes: PERRLA, EOMI, no exophthalmos ENT: moist mucous membranes, no thyromegaly, no  cervical lymphadenopathy Cardiovascular: RRR, No MRG Respiratory: CTA B Gastrointestinal: abdomen soft, NT, ND, BS+ Musculoskeletal: no deformities, strength intact in all 4 Skin: moist, warm, acanthosis nigricans on neck Neurological: no tremor with outstretched hands, DTR normal in all 4  Assessment:     1. Weight gain - ? If hypothyroidism - ? If diabetes   2. Likely insulin resistance    Plan:     I had a long discussion with patient and her mother regarding the need for weight loss especially in the setting of insulin resistance. We discussed about a plant-based diet, and I explained the many benefits, possible sources of vegetables proteins, the need for B12 supplementation, and possible hurdles into implementing his diet. I also gave her materials about a plant-based diet. She tried this and she lost 30 lbs. She is very happy with the result and will continue. - I will also refer her to nutrition for more in-depth discussion and possible personalized meal plan >> she will continue seeing the nutritionist - I will see the patient back in 6 months for evaluation, will check TFTs then along with a new HbA1c

## 2013-01-26 NOTE — Patient Instructions (Signed)
GREAT JOB with your weight loss! Please come back for a follow-up appointment in 6 months.  Plant-based diet materials: - Lectures (you tube):  Lequita AsalNeal Barnard: "Breaking the Food Seduction"  Doug Lisle: "How to Lose Weight, without Losing Your Mind"  Lucile CraterRodney Snow: "What is Insulin Resistance" TucsonEntrepreneur.sihttp://www.youtube.com/watch?v=k5iM6A67z3Y - Documentaries:  Supersize Me  Food Inc[employer]  Forks over BorgWarnerKnives  Vegucated  Fat, Sick and Nearly Dead  The Edison InternationalWeight of the Nationwide Mutual Insuranceation - Books:  Lequita AsalNeal Barnard: "Program for Reversing Diabetes"  Ferol LuzColin Campbell: "The Armeniahina Study"  Konrad PentaDonna Klein: "Supermarket Vegan" (cookbook) - Facebook pages:   Reece AgarForks versus Knives  Vegucated  Toys ''R'' UsVegNews Magazine  Food Matters - Healthy nutrition info websites:  LateTelevision.com.eehttp://nutritionfacts.org/

## 2013-01-31 ENCOUNTER — Ambulatory Visit (INDEPENDENT_AMBULATORY_CARE_PROVIDER_SITE_OTHER): Payer: 59 | Admitting: Marriage and Family Therapist

## 2013-01-31 DIAGNOSIS — F331 Major depressive disorder, recurrent, moderate: Secondary | ICD-10-CM

## 2013-01-31 DIAGNOSIS — F988 Other specified behavioral and emotional disorders with onset usually occurring in childhood and adolescence: Secondary | ICD-10-CM

## 2013-01-31 NOTE — Progress Notes (Signed)
   THERAPIST PROGRESS NOTE  Session Time:  3:00 - 4:00 p.m.  Participation Level: Active  Behavioral Response: CasualAlertDepressed (mild)  Type of Therapy: Individual Therapy  Treatment Goals addressed: Coping  Interventions: Strength-based and Supportive  Summary: [name] is a 23 y.o. female who presents with depression and ADHD.  She was referred by Dr. Ladona Ridgelaylor.  Patient reported there was not much she needed to talk about.  She did say things at home continue to be chaotic and gave examples of her brother acting out angrily because he did not want to go to school, that both father and brother are up all night and she is not able to sleep.  She reports getting an hour sleep two nights ago as a result.  She reports her mother did attend a couples session but did not know how it went.  She also talked about school and her frustrations with her art class.  She reports not getting any response in her job search but is planning on visiting the places she is seeking employment again.  Suicidal/Homicidal: NA  Therapist Response:   Continued to talk about patient's need for independence.  Discussed how it is difficult for patient to cope in a healthy way in this environment except to focus on her continuing to develop plans on how to find her own place eventually.  Talked about patient taking the opportunity to work on her treatment plan when she is not in crisis.  She did state she has been thinking for some time to relearn CBT.  This writer told her it is up to her to make a decision to work in therapy on CBT since now she is an adult.  Patient will consider same.  Plan: Return again in 1 weeks.  Diagnosis: Axis I: Major depressive d/o, moderate; ADHD, w/o hyperactivity    Axis II: Deferred    Mary Labrum, LMFT, CTS 01/31/2013

## 2013-02-07 ENCOUNTER — Ambulatory Visit (INDEPENDENT_AMBULATORY_CARE_PROVIDER_SITE_OTHER): Payer: 59 | Admitting: Marriage and Family Therapist

## 2013-02-07 DIAGNOSIS — F988 Other specified behavioral and emotional disorders with onset usually occurring in childhood and adolescence: Secondary | ICD-10-CM

## 2013-02-07 DIAGNOSIS — F331 Major depressive disorder, recurrent, moderate: Secondary | ICD-10-CM

## 2013-02-07 NOTE — Progress Notes (Signed)
   THERAPIST PROGRESS NOTE  Session Time:  3:00 - 4:00 p.m.  Participation Level: Active  Behavioral Response: CasualAlertDepressed  Type of Therapy: Individual Therapy  Treatment Goals addressed: Coping  Interventions: Strength-based and Supportive  Summary: [name] is a 23 y.o. female who presents with depression and ADHD.  She was referred by Dr. Ladona Ridgelaylor.  Patient reports she has been depressed because her brother has been verbally abusive towards the family but in particular towards patient.  She reports her mother actually confronted her brother more than once about how patient was being treated.  She reports she stays in her room to get away from him although not as bad as the situation was with him two years ago.  She also states her primary concern is trying to find a job and she is working hard to do so.     Suicidal/Homicidal: NA  Therapist Response:   Talked about patient's depression not being alleviated consistently until she gets out of the house helped patient with tips on how to look for a job and interview.  Talked about patient needing to be more patient and lower her expectations around job hunting.  Plan: Return again in 1 weeks.  Diagnosis: Axis I: Major depressive d/o, moderate; ADHD w/o hyperactivity    Axis II: Deferred    Mary Staszewski, LMFT, CTS 02/07/2013

## 2013-02-13 ENCOUNTER — Ambulatory Visit (INDEPENDENT_AMBULATORY_CARE_PROVIDER_SITE_OTHER): Payer: 59 | Admitting: Marriage and Family Therapist

## 2013-02-13 DIAGNOSIS — F331 Major depressive disorder, recurrent, moderate: Secondary | ICD-10-CM

## 2013-02-13 DIAGNOSIS — F988 Other specified behavioral and emotional disorders with onset usually occurring in childhood and adolescence: Secondary | ICD-10-CM

## 2013-02-14 ENCOUNTER — Ambulatory Visit (INDEPENDENT_AMBULATORY_CARE_PROVIDER_SITE_OTHER): Payer: 59 | Admitting: Psychiatry

## 2013-02-14 VITALS — BP 118/72 | HR 88 | Ht 62.0 in | Wt 164.0 lb

## 2013-02-14 DIAGNOSIS — F331 Major depressive disorder, recurrent, moderate: Secondary | ICD-10-CM

## 2013-02-14 DIAGNOSIS — F332 Major depressive disorder, recurrent severe without psychotic features: Secondary | ICD-10-CM | POA: Insufficient documentation

## 2013-02-14 MED ORDER — DEXMETHYLPHENIDATE HCL ER 5 MG PO CP24: ORAL_CAPSULE | ORAL | Status: DC

## 2013-02-14 MED ORDER — LAMOTRIGINE 200 MG PO TABS: 100.0000 mg | ORAL_TABLET | ORAL | Status: DC

## 2013-02-14 MED ORDER — TRAZODONE HCL 50 MG PO TABS: ORAL_TABLET | ORAL | Status: DC

## 2013-02-14 MED ORDER — SERTRALINE HCL 100 MG PO TABS: 100.0000 mg | ORAL_TABLET | Freq: Every day | ORAL | Status: DC

## 2013-02-14 NOTE — Progress Notes (Signed)
[facility] MD Progress Note  02/14/2013 4:12 PM [name]  MRN:  [id number] Subjective:  Okay This patient is a 23 year old white female who been diagnosed with an affective disorder mostly lately that of major depression. The patient was hospitalized when she was age 59 and placed on antidepressants. She was diagnosed your years earlier with attention deficit disorder and placed on Concerta. She claims a Concerta has lost its effect. She's been on multiple different stimulants including Vyvanse for years which eventually failed and [place] patch which is called side effects. Today I suggested Strattera and she was resistant. She claims her mother knows about it and others have told her bad things about it. She quickly points out that it is a non-stimulant agent. Today the patient actually is doing quite well. She presently is actively involved in therapy. Her mood is stable. She denies depression. She is sleeping and eating well. She is good energy. 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 This is despite the fact that she's been off of stimulants for almost 2 months. She says she needs stimulants because it helps her drive in a car.? The patient denies the use of alcohol or drugs. She's never been psychotic. She denies symptoms consistent with mania. The patient presently is taking Lamictal and initially try to come off of it but when she got down to a low dose she felt of mood instability affect. The patient therefore herself on back to the full dose of 200. The patient describes persistent daily depression that occurred years ago but the death of her grandmother. She's not a good historian about describing other episodes of depression. The patient denies symptoms of generalized anxiety disorder. She does describe acute episodes of panic attacks. This is unassociated with avoidance, anticipatory anxiety or feelings that her attacks are detrimental to her health. In essence the patient has panic  attacks but not panic disorder. Presently the patient lives with her parents. The patient is wishing to get back to go for college. At this time the patient take Xanax only when necessary. The patient is actively involved in therapy and is beginning cognitive behavioral therapy at the Center. The patient has no medical problems. Diagnosis:   DSM5: Schizophrenia Disorders:   Obsessive-Compulsive Disorders:   Trauma-Stressor Disorders:   Substance/Addictive Disorders:   Depressive Disorders:  Major Depression Total Time spent with patient: 45 minutes  Axis I: Major Depression, Recurrent severe  ADL's:  Intact  Sleep: Good  Appetite:  Good  Suicidal Ideation:  no Homicidal Ideation:  none AEB (as evidenced by):  Psychiatric Specialty Exam: Physical Exam  ROS  Blood pressure 118/72, pulse 88, height 5\' 2"  (1.575 m), weight 164 lb (74.39 kg).Body mass index is 29.99 kg/(m^2).  General Appearance: Casual  Eye Contact::  Good  Speech:  Clear and Coherent  Volume:  Normal  Mood:  Irritable  Affect:  Congruent  Thought Process:  Coherent  Orientation:  Full (Time, Place, and Person)  Thought Content:  WDL  Suicidal Thoughts:  No  Homicidal Thoughts:  No  Memory:  NA  Judgement:  Good  Insight:  Fair  Psychomotor Activity:  Normal  Concentration:  Good  Recall:  Good  Fund of Knowledge:Good  Language: Good  Akathisia:  No  Handed:  Right  AIMS (if indicated):     Assets:  Desire for Improvement  Sleep:      Musculoskeletal: Strength & Muscle Tone:  Gait & Station:  Patient leans:  Current Medications: Current Outpatient Prescriptions  Medication Sig Dispense Refill  . acetaminophen (TYLENOL) 500 MG tablet Take 500 mg by mouth every 6 (six) hours as needed for pain.      Marland Kitchen. ALPRAZolam (XANAX) 0.5 MG tablet Take 0.5 mg by mouth as needed for sleep.      Marland Kitchen. dexmethylphenidate (FOCALIN XR) 5 MG 24 hr capsule 2 qam  Fill after3/04/2013  60 capsule  0  . lamoTRIgine  (LAMICTAL) 200 MG tablet Take 0.5-1 tablets (100-200 mg total) by mouth See admin instructions. Alternates every other night. Last dose was 0.5 tablet  30 tablet  5  . lisdexamfetamine (VYVANSE) 70 MG capsule Take 70 mg by mouth daily as needed (only takes during school year).       . meclizine (ANTIVERT) 50 MG tablet Take 0.5 tablets (25 mg total) by mouth 3 (three) times daily as needed for dizziness or nausea.  30 tablet  0  . methylphenidate (DAYTRANA) 30 MG/9HR Place 1 patch onto the skin daily. wear patch for 9 hours only each day  30 patch  0  . Naproxen Sodium (ALEVE PO) Take 2 tablets by mouth daily as needed (for pain).      . norgestimate-ethinyl estradiol (SPRINTEC 28) 0.25-35 MG-MCG tablet Take 1 tablet by mouth daily.      . sertraline (ZOLOFT) 100 MG tablet Take 1 tablet (100 mg total) by mouth daily.  45 tablet  5  . traZODone (DESYREL) 50 MG tablet TAKE 1 & 1/2 TABLETS BY MOUTH AT BEDTIME.  45 tablet  3   No current facility-administered medications for this visit.    Lab Results: No results found for this or any previous visit (from the past 48 hour(s)).  Physical Findings: AIMS:  , ,  ,  ,    CIWA:    COWS:     Treatment Plan Summary: At this time this patient will begin on focal and 5 mg one in the morning and may increase it to 2 every morning. She'll receive 2 months her prescription of this agent. She's never been on focal and before. The patient will continue taking Zoloft 150 mg and continue Lamictal 200 mg at night. The patient rarely takes the Xanax 0.5 mg but does take trazodone 50 mg 3 at night. The patient will continue in therapy. Once again the Focalin did not work I would again recommend Strattera as a treatment.  Plan:  Medical Decision Making Problem Points:  Established problem, worsening (2) Data Points:  Review of medication regiment & side effects (2)  I certify that inpatient services furnished can reasonably be expected to improve the patient's  condition.   Wilfredo Canterbury IRVING 02/14/2013, 4:13 PM

## 2013-02-14 NOTE — Progress Notes (Signed)
   THERAPIST PROGRESS NOTE  Session Time:  3:00 - 4:00 p.m.  Participation Level: Active  Behavioral Response: CasualAlertDepressed (mild)  Type of Therapy: Individual Therapy  Treatment Goals addressed: Coping  Interventions: CBT, Biofeedback, Supportive and Meditation: Mindfulness meditation  Summary: Mary MerlinMeghan [name] is a 23 y.o. female who presents with depression and ADHD.  She was referred by Dr. Ladona Ridgelaylor.  Patient reports she had a depressive episode a few days ago where she could not stop crying.  She reports not knowing the cause of it but ended up getting her period.  She also reports she is worried about her brother because he has been having difficulty academically and has not been going to school because he has been depressed.  She also reports applying to four [employer] but has not heard anything.  She also talked about "being bored" with school and "nothing seems to interest me right now."     Suicidal/Homicidal: No  Therapist Response:   Discussed at first how patient handled her past week.  She admits taking Xanax the day she could not stop crying.  Told her not to dismiss medical reasons for being so depressed.  Also continued to encourage her to job hunt and go back to the places she has already applied.  Talked about being bored possibly leading to her becoming more depressed.  Had the discussion that now is the time for her to revisit her treatment plan particularly revising learning CBT and mindfulness meditation.  Recommended the book, "Depression the new mood therapy."  Patient states this is now the time because relearning will help her develop skills for when she is on her own.  Plan: Return again in 1 weeks.  Diagnosis: Axis I: Major depressive d/o, moderate; ADHD  w/o hyperactivity    Axis II: Deferred    Jadine Brumley, LMFT, CTS 02/14/2013

## 2013-02-15 ENCOUNTER — Other Ambulatory Visit (HOSPITAL_COMMUNITY): Payer: Self-pay | Admitting: *Deleted

## 2013-02-15 MED ORDER — LAMOTRIGINE 200 MG PO TABS: 200.0000 mg | ORAL_TABLET | Freq: Every day | ORAL | Status: DC

## 2013-02-15 NOTE — Telephone Encounter (Signed)
Pharmacy requested clarification of Lamictall RX Per Dr. Donell BeersPlovsky, should be Lamictal 200 mg, take 1 nightly

## 2013-02-21 ENCOUNTER — Ambulatory Visit (HOSPITAL_COMMUNITY): Payer: Self-pay | Admitting: Marriage and Family Therapist

## 2013-02-28 ENCOUNTER — Ambulatory Visit (HOSPITAL_COMMUNITY): Payer: Self-pay | Admitting: Marriage and Family Therapist

## 2013-03-07 ENCOUNTER — Ambulatory Visit (HOSPITAL_COMMUNITY): Payer: Self-pay | Admitting: Marriage and Family Therapist

## 2013-03-07 NOTE — Progress Notes (Unsigned)
   THERAPIST PROGRESS NOTE  Session Time:  3:00 - 4:00 p.m.  Participation Level: {BHH PARTICIPATION LEVEL:22264}  Behavioral Response: {Appearance:22683}{BHH LEVEL OF CONSCIOUSNESS:22305}{BHH MOOD:22306}  Type of Therapy: Individual Therapy  Treatment Goals addressed: Coping  Interventions: {CHL AMB BH Type of Intervention:21022753}  Summary: [name] is a 23 y.o. female who presents with depression and ADHD.  She was referred  By Dr. Ladona Ridgelaylor.    Suicidal/Homicidal: {BHH YES OR NO:22294}{yes/no/with/without intent/plan:22693}  Therapist Response: ***  Plan: Return again in *** weeks.  Diagnosis: Axis I: Major derpessive d/o, moderate; ADHD w/o hyperactivity    Axis II: Deferred    [place] Oborn, LMFT, CTS 03/07/2013

## 2013-03-14 ENCOUNTER — Ambulatory Visit (INDEPENDENT_AMBULATORY_CARE_PROVIDER_SITE_OTHER): Payer: 59 | Admitting: Marriage and Family Therapist

## 2013-03-14 DIAGNOSIS — F331 Major depressive disorder, recurrent, moderate: Secondary | ICD-10-CM

## 2013-03-14 DIAGNOSIS — F988 Other specified behavioral and emotional disorders with onset usually occurring in childhood and adolescence: Secondary | ICD-10-CM

## 2013-03-14 NOTE — Progress Notes (Signed)
   THERAPIST PROGRESS NOTE  Session Time:  3:00 - 4:00 p.m.  Participation Level: Active  Behavioral Response: CasualAlertDepressed (mild)  Type of Therapy: Individual Therapy  Treatment Goals addressed: Coping  Interventions: Strength-based and Supportive  Summary: Mary MerlinMeghan [name] is a 23 y.o. female who presents with depression and ADHD.  She was referred by Dr. Ladona Ridgelaylor. Patient reports she has not been in because she has been sick for almost a month.  She reports over the weekend having a 24 hour depression but did not now why.  She reports her brother is acting out, not going to school, depressed and angry at everyone, particularly her father.  She talked about her father having a colonoscopy and they found a polyp and she is afraid it is cancer because of a history of colon cancer.  She talked about how school is going and is in the midst of reapplying to [employer].  She also reports she has been unable to find a job but has continued to look.  Patient also reported meeting with Dr. Jasmine AwePlovski.  Suicidal/Homicidal: No  Therapist Response:  Assessed patient's depression.  According to patient and how she looked in the session patient has not been experiencing much depression over the past month with the exception of the past weekend.  Had a conversation with patient about starting to attend the [employer] [place].  Explained that she needed to branch out more and not solely have therapy since she is not really doing the work. Gave patient two weeks to contact the Association.  Plan: Return again in 1 weeks.  Diagnosis: Axis I: Major depressive d/o, moderate; ADHD w/o hyperactivity    Axis II: Deferred    Mary Banet, LMFT, CTS 03/14/2013

## 2013-03-21 ENCOUNTER — Ambulatory Visit (INDEPENDENT_AMBULATORY_CARE_PROVIDER_SITE_OTHER): Payer: 59 | Admitting: Marriage and Family Therapist

## 2013-03-21 DIAGNOSIS — F331 Major depressive disorder, recurrent, moderate: Secondary | ICD-10-CM

## 2013-03-21 DIAGNOSIS — F988 Other specified behavioral and emotional disorders with onset usually occurring in childhood and adolescence: Secondary | ICD-10-CM

## 2013-03-21 NOTE — Progress Notes (Signed)
   THERAPIST PROGRESS NOTE  Session Time:  3:00 - 4:00 p.m.  Participation Level: Active  Behavioral Response: CasualAlertDepressed/Tearful  Type of Therapy: Individual Therapy  Treatment Goals addressed: Coping  Interventions: Strength-based and Supportive  Summary: [name] is a 23 y.o. female who presents with depression and ADHD.  She was referred by Dr. Ladona Ridgelaylor.   Patient reports she has been having difficulty coping with her life.  She reports her goal is to get out of the house but stressors at home have lead to her being unable to make plans to do so.  She reports [facility] at [employer] there is a program to help students in how to go about looking for a job but her father will not let her have the car or drive her because "he has chorus at church."  She states she "does not feel like a priority.  She also states because of the same issue she was unable to get to [employer] for their program and she is worried she will not be able to go to "job night" that is scheduled in April.  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  She also states she continues to be unable to obtain employment.  Suicidal/Homicidal: NA  Therapist Response:  Patient was crying throughout this session and this writer was unable to discuss patient's treatment plan.  She was expressing feelings of hopelessness relating to being unable to move forward as an adult.  She began using excuses e.g., I can't drive on [address] at rush hour, I'll get into an accident, I can't use public transportation.  She was confronted on giving up too easily when she is faced with adversity.  Helped patient come up with a plan.  By the end of the session she agreed she will be assertive with her parents regarding her needs when she gets home.  Plan: Return again in 1 weeks.  Diagnosis: Axis I: Major depressive d/o, moderate; ADHD w/o hyperactivity    Axis II: Deferred    Dontrelle Mazon, LMFT,  CTS 03/21/2013

## 2013-03-26 ENCOUNTER — Encounter: Payer: 59 | Attending: Internal Medicine | Admitting: Dietician

## 2013-03-26 VITALS — Ht 61.0 in | Wt 163.3 lb

## 2013-03-26 DIAGNOSIS — E669 Obesity, unspecified: Secondary | ICD-10-CM | POA: Insufficient documentation

## 2013-03-26 DIAGNOSIS — Z713 Dietary counseling and surveillance: Secondary | ICD-10-CM | POA: Insufficient documentation

## 2013-03-26 DIAGNOSIS — E663 Overweight: Secondary | ICD-10-CM

## 2013-03-26 NOTE — Progress Notes (Signed)
  Medical Nutrition Therapy:  Appt start time: 300 end time:  315.   Assessment:  Primary concerns today: [name] returns today for a follow up for weight loss. She lost about 3 lbs since her last visit. States that she has a headache and is under a lot of stress from school. Started taking multivitamin. Signed up for gym. Would like to lose another 50 lbs.   Wt Readings from Last 3 Encounters:  03/26/13 163 lb 4.8 oz (74.072 kg)  02/14/13 164 lb (74.39 kg)  01/26/13 167 lb (75.751 kg)   Ht Readings from Last 3 Encounters:  03/26/13 5\' 1"  (1.549 m)  02/14/13 5\' 2"  (1.575 m)  12/28/12 5\' 1"  (1.549 m)   Body mass index is 30.87 kg/(m^2). @BMIFA @ Normalized weight-for-age data available only for age 25 to 20 years. Normalized stature-for-age data available only for age 25 to 20 years.  MEDICATIONS: see list   DIETARY INTAKE:  24-hr recall:  B ( AM): egg white with Canadian bacon muffin with lowfat yogurt and milk or coffee or water Snk ( AM): nuts and crackers L ( PM): salad with egg, bacon, "bacon grease" dressing or sandwich with water Snk ( PM):apples or other fruit with cheese D ( PM): meat with vegetables and rice with water  Snk ( PM): nuts  Beverages: water  Usual physical activity: bike riding 1 x week 30 minutes, walking 30 minutes, planning to exercise 3 or more x week   Estimated energy needs: 2000 calories 225 g carbohydrates 150 g protein 56 g fat  Progress Towards Goal(s):  In progress.   Nutritional Diagnosis:  NB-1.1 Food and nutrition-related knowledge deficit As related to history of large portions, energy dense food choices, meal skipping, and lack of physical activity .  As evidenced by BMI of 33.8.    Intervention:  Nutrition counseling provided. Discussed adding more vegetables to her diet and working on exercising while she is on break for school.    Plan: Fill half of your plate with vegetables. Continue 3 meals per day and 2-3 snacks with protein.  Eat every 3-5 hours you are awake.  Limit starches to a quarter of the plate - choose one starch/carb meal.  Aim to exercise 3-4 days/week 30-60 minutes (walking/cycling).  Start planning out meals for the week.  Continue using small plates for meals to help with portion sizes.  Handout:  Yellow Card   Monitoring/Evaluation:  Dietary intake, exercise, and body weight prn.

## 2013-03-26 NOTE — Patient Instructions (Addendum)
Fill half of your plate with vegetables. Continue 3 meals per day and 2-3 snacks with protein. Eat every 3-5 hours you are awake.  Limit starches to a quarter of the plate - choose one starch/carb meal.  Aim to exercise 3-4 days/week 30-60 minutes (walking/cycling).  Start planning out meals for the week.  Continue using small plates for meals to help with portion sizes.

## 2013-03-28 ENCOUNTER — Ambulatory Visit (HOSPITAL_COMMUNITY): Payer: Self-pay | Admitting: Marriage and Family Therapist

## 2013-04-04 ENCOUNTER — Ambulatory Visit (INDEPENDENT_AMBULATORY_CARE_PROVIDER_SITE_OTHER): Payer: 59 | Admitting: Marriage and Family Therapist

## 2013-04-04 DIAGNOSIS — F988 Other specified behavioral and emotional disorders with onset usually occurring in childhood and adolescence: Secondary | ICD-10-CM

## 2013-04-04 DIAGNOSIS — F331 Major depressive disorder, recurrent, moderate: Secondary | ICD-10-CM

## 2013-04-04 NOTE — Progress Notes (Signed)
   THERAPIST PROGRESS NOTE  Session Time:  3:00 - 4:00 p.m.  Participation Level: Active  Behavioral Response: CasualAlertDepressed/overwhelmed  Type of Therapy: Individual Therapy  Treatment Goals addressed: Coping  Interventions: Supportive and Other: Reframing  Summary: Mary MerlinMeghan [name] is a 23 y.o. female Caucasian who presents with depression and ADHD. She was referred by Dr. Ladona Ridgelaylor.  Patient first came in reporting she was very depressed and overwhelmed.  She reports it is mainly because of school with several projects being due.  She reports she does not like her art class.  She reports believing she will not get good grades.  She also reports her family is getting ready for her brother's and fiance's wedding in May and that the house is being painted.  She states for the past two weeks "every day I had a panic attack."  Patient states "at least I will be with my '[employer]" group over the weekend."     Suicidal/Homicidal: Negative  Therapist Response:   Took the session to reframe how patient was feeling negative and overwhelmed about her school work.  With discussion patient was told she was procrastinating which was causing her so much distress.  Talked about this time of the semester being difficult for patient in general and how she tends to avoid the stress by avoiding the work that needs to be done.  Patient stated, "I don't know why I wait until the last minute but I usually get things done."  This comment actually lead to her feeling better.  Patient was crying throughout the session until this discussion.  She was asked what coping skills she used when she had a panic attack and she said "nothing." she also could not figure out what she was having panic attacks about until the discussion about school.  Need to have a discussion around her treatment plan and lack of progress.  Plan: Return again in 1 weeks.  Diagnosis: Axis I: Major depressive d/o, moderate; ADHD w/o  hyperactivity    Axis II: Deferred    Ben Habermann, LMFT, CTS 04/04/2013

## 2013-04-18 ENCOUNTER — Ambulatory Visit (HOSPITAL_COMMUNITY): Payer: Self-pay | Admitting: Psychiatry

## 2013-04-26 ENCOUNTER — Ambulatory Visit (INDEPENDENT_AMBULATORY_CARE_PROVIDER_SITE_OTHER): Payer: 59 | Admitting: Marriage and Family Therapist

## 2013-04-26 DIAGNOSIS — F331 Major depressive disorder, recurrent, moderate: Secondary | ICD-10-CM

## 2013-04-26 DIAGNOSIS — F988 Other specified behavioral and emotional disorders with onset usually occurring in childhood and adolescence: Secondary | ICD-10-CM

## 2013-04-26 NOTE — Progress Notes (Signed)
   THERAPIST PROGRESS NOTE  Session Time:  2:00 - 3:00 p.m.  Participation Level: Active  Behavioral Response: CasualDrowsyDepressed  Type of Therapy: Individual Therapy  Treatment Goals addressed: Coping  Interventions: Strength-based and Supportive  Summary: [name] is a 23 y.o. female Caucasian who presents with depression and ADHD. She was referred by Dr. Ladona Ridgelaylor.   Patient reports she had a "meltdown" in school where she was in art class and cried for 35 minutes.  She reports calling her mother and father to come get her and that she was suicidal during those 35 minutes.  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  She talked about the stressors she has experienced in the past five days.  Patient states her parents had a "really big argument" over the weekend in which she had to intervene.  She reports her brother has been depressed, and she is overwhelmed with school.  She did say she was spending time with friends at [employer] every weekend and "thought she was doing okay until now."  Suicidal/Homicidal: No  Assessed patient's suicidal thinking this morning.  She reports she is no longer suicidal, that it was an impulsive thought.  She reports no plans to hurt herself and "just wants to go home and sleep which makes her feel better."  Discussed patient in the past being "impulsive" in hurting herself (patient has tried to hang herself in the past) and patient did contract for safety promising she would go to our assessment department if she was not safe.  Therapist Response:  Processed patient's depression.  Patient appeared depressed (flat affect) but stated she was "just tired."  Talked about patient not coming in for therapy and she stated she was doing okay up until today.  Discussed patient's stressors and worked to help patient narrow down what was the next important  issue she had to deal with but patient could not come up with anything except "sleep."  Gave her the homework assignment that she would go through the next two days thinking "okay, what do I have to focus on right now?" based on her becoming overwhelmed "about everything.  Also told patient that this writer is going on vacation from 05/11/13 and will return on 05/29/13.  Talked about what she will do in the case of an emergency while this writer is on vacation. Also talked about patient avoiding contacting the [employer] and that it is now mandatory for her to go.  Patient agreed and again gave her the brochure with contact number.  Plan: Return again in 1 weeks.  Diagnosis: Axis I: Major depressive d/o, moderate; ADHD w/o hyperactivity    Axis II: Deferred    Mee Macdonnell, LMFT, CTS 04/26/2013

## 2013-05-02 ENCOUNTER — Ambulatory Visit (HOSPITAL_COMMUNITY): Payer: Self-pay | Admitting: Marriage and Family Therapist

## 2013-05-08 ENCOUNTER — Telehealth (HOSPITAL_COMMUNITY): Payer: Self-pay

## 2013-05-10 ENCOUNTER — Telehealth (HOSPITAL_COMMUNITY): Payer: Self-pay | Admitting: Marriage and Family Therapist

## 2013-05-10 NOTE — Telephone Encounter (Signed)
On 05/09/13, patient called in stating she was "having a hard time coping with school."  She reported she was getting a D in film class and possibly was failing her art class.  She reported that on 05/10/13 it would be her last day in school and she believed she would be feeling better once she was done.  Spoke with patient about what to do in a crisis including calling 911, going to her emergency room, or coming to our hospital.  Talked about patient calling in a crisis but then not following through on treatment recommendations.  Patient actually agreed this this idea but stated she "thought she would be okay after 4/30.  Talked about this writer being on vacation and coming back on 05/29/13 and that patient needed to make sure she had appointments for that time.  Cleophas DunkerJoanie Neal Oshea, LMFT, CTS Counselor

## 2013-05-29 ENCOUNTER — Ambulatory Visit (INDEPENDENT_AMBULATORY_CARE_PROVIDER_SITE_OTHER): Payer: 59 | Admitting: Marriage and Family Therapist

## 2013-05-29 DIAGNOSIS — F988 Other specified behavioral and emotional disorders with onset usually occurring in childhood and adolescence: Secondary | ICD-10-CM

## 2013-05-29 DIAGNOSIS — F330 Major depressive disorder, recurrent, mild: Secondary | ICD-10-CM

## 2013-05-29 NOTE — Progress Notes (Signed)
   THERAPIST PROGRESS NOTE  Session Time:  3:00 - 4:00 p.m.  Participation Level: Active  Behavioral Response: CasualAlertshowing no symptoms  Type of Therapy: Individual Therapy  Treatment Goals addressed: Coping  Interventions: Strength-based and Supportive  Summary: [name] is a 23 y.o. female who presents with depression and ADHD.  She was referred by Dr. Ladona Ridgelaylor.   Patient reports she is doing well.  She talked about looking forward to this weekend because her older brother is here and is getting married.  Patient talked about school in that she did pass both of her classes with C's and has signed up for culinary classes for next semester.  Patient talked about finding a job after her brother leaves.  She also reports things at home have been calm, based on the wedding and her father has been applying for jobs for the first time in 20 years.  Suicidal/Homicidal: NA  Therapist Response:  Assessed patient's symptoms and based on body language and self-reports patient had no depression or anxiety since school ended.  Tried to use the wedding to talk about how patient can connect with her brother and new sister-in-law in a healthy way and for support and guidance.  Talked about this Clinical research associatewriter retiring.  Processed with patient, talked about referrals.  Also talked about patient's need to contact the [employer] [facility] [employer] [facility] [place] now that she will need more support.  Plan: Return again in 1 weeks.  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

## 2013-06-05 ENCOUNTER — Ambulatory Visit (HOSPITAL_COMMUNITY): Payer: Self-pay | Admitting: Marriage and Family Therapist

## 2013-06-05 NOTE — Progress Notes (Signed)
   THERAPIST PROGRESS NOTE  Session Time:  3:00 - 4:00 p.m.  Participation Level: Active  Behavioral Response: CasualAlertNo symptoms present  Type of Therapy: Individual Therapy  Treatment Goals addressed: Coping  Interventions: Strength-based and Supportive  Summary: [name] is a 23 y.o. female Caucasian who presents with depression and ADHD.  She was referred by Dr. Ladona Ridgel.   Patient reports she had a difficult time this past week because this therapist is leaving.  Patient did state she was doing "a lot better" because over the weekend her brother and now sister-in-law got married.  She reported "the wedding was wonderful."  She states she liked being a part of it, getting ready for the wedding, etc.    Suicidal/Homicidal: NA  Therapist Response:   Assessed patient's symptoms particularly relating to how she is coping with the loss of her therapist (termination).  Processed for some time what the relationship meant to her, and talked about how she would cope, how she would view therapy with a new therapist.  Talked about her no longer being a child in therapy, and how she could be assertive with the next therapist about how she likes to do therapy and her needs.  Patient agreed to this idea.  She states she wants to work with someone who will help her develop a life plan having to do with moving forward in school and eventually work.  Patient states this morning her mother told her mother made an appointment with Heloise Ochoa, [facility] therapist.  This writer also recommended Jerral Bonito and Veto Kemps.  Patient will continue to process termination and will come in two more times to process same.  Plan: Return again in 1 weeks.  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

## 2013-06-06 ENCOUNTER — Ambulatory Visit (INDEPENDENT_AMBULATORY_CARE_PROVIDER_SITE_OTHER): Payer: 59 | Admitting: Marriage and Family Therapist

## 2013-06-06 DIAGNOSIS — F331 Major depressive disorder, recurrent, moderate: Secondary | ICD-10-CM

## 2013-06-06 DIAGNOSIS — F988 Other specified behavioral and emotional disorders with onset usually occurring in childhood and adolescence: Secondary | ICD-10-CM

## 2013-06-14 ENCOUNTER — Other Ambulatory Visit (HOSPITAL_COMMUNITY): Payer: Self-pay | Admitting: Psychiatry

## 2013-06-14 ENCOUNTER — Ambulatory Visit (INDEPENDENT_AMBULATORY_CARE_PROVIDER_SITE_OTHER): Payer: 59 | Admitting: Marriage and Family Therapist

## 2013-06-14 DIAGNOSIS — F331 Major depressive disorder, recurrent, moderate: Secondary | ICD-10-CM

## 2013-06-14 DIAGNOSIS — F330 Major depressive disorder, recurrent, mild: Secondary | ICD-10-CM

## 2013-06-14 DIAGNOSIS — F988 Other specified behavioral and emotional disorders with onset usually occurring in childhood and adolescence: Secondary | ICD-10-CM

## 2013-06-14 NOTE — Progress Notes (Signed)
   THERAPIST PROGRESS NOTE  Session Time:  9:00 - 10:00 a.m.  Participation Level: Active  Behavioral Response: CasualAlertNo Symptoms Noted  Type of Therapy: Individual Therapy  Treatment Goals addressed: Coping  Interventions: Supportive/strength-based  Summary: Salmai Cregan is a 23 y.o. female Caucasian who presents with depression and ADHD.  She was referred by Dr. Ladona Ridgel.   Patient reports she had little to discuss today.  She stated she was feeling "okay" although the appointment was too early for her and she was tired.  She reports she has been up all night playing video games and sleeping all day.    Suicidal/Homicidal: NA  Therapist Response:   Assessed for any depression.  Patient showed no signs of depression based on body language and self-report.  Discussed the reason being that all of patient's triggers are not evident at this time.  Talked about patient getting a job ASAP but patient did not respond.  Spent most of the session talking about terminating with this Clinical research associate and referrals.  Processed patient's time in therapy with this Clinical research associate.  Gave patient list of seven referrals but pointed out two:  [facility] [place] office and [facility].  Will see patient once more before termination.  Plan: Return again in 1 weeks.  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

## 2013-06-21 ENCOUNTER — Ambulatory Visit (INDEPENDENT_AMBULATORY_CARE_PROVIDER_SITE_OTHER): Payer: 59 | Admitting: Marriage and Family Therapist

## 2013-06-21 DIAGNOSIS — F330 Major depressive disorder, recurrent, mild: Secondary | ICD-10-CM

## 2013-06-21 DIAGNOSIS — F988 Other specified behavioral and emotional disorders with onset usually occurring in childhood and adolescence: Secondary | ICD-10-CM

## 2013-06-21 NOTE — Progress Notes (Signed)
   THERAPIST PROGRESS NOTE  Session Time:  11:00 - Noon  Participation Level: Active  Behavioral Response: CasualAlertNo symptoms  Type of Therapy: Individual Therapy  Treatment Goals addressed: Coping  Interventions: Strength-based and Supportive/Termination  Summary: [name] is a 23 y.o. female Caucasian who presents with depression and ADHD.  She was referred by Dr. Ladona Ridgel.  Patient states she is doing well with depression since she is not in school.  She did however states she is "very bored" and does not know what she is going to do this summer.  She reports she will be meeting with her "[employer]" group this weekend which she is glad about since  She is "losing her therapist."  Patient requested not addressing any issues since this is her last session.  Suicidal/Homicidal: NA  Therapist Response:  Assessed patient's depression although patient expressed having none.  Discussed what she would be doing over the summer.  Gave patient article on procrastinating academically.  Patient brought in pictures of her brother's wedding to "keep the session light."  Discussed what patient's experience has been in therapy with this Clinical research associate, her referral (she will be using [facility] and is seeing someone on June 26.  Plan: Return again in 0 weeks.  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

## 2013-07-26 ENCOUNTER — Encounter: Payer: Self-pay | Admitting: Internal Medicine

## 2013-07-26 ENCOUNTER — Ambulatory Visit (INDEPENDENT_AMBULATORY_CARE_PROVIDER_SITE_OTHER): Payer: 59 | Admitting: Internal Medicine

## 2013-07-26 VITALS — BP 118/68 | HR 87 | Temp 98.5°F | Resp 12 | Wt 166.0 lb

## 2013-07-26 DIAGNOSIS — E663 Overweight: Secondary | ICD-10-CM

## 2013-07-26 NOTE — Progress Notes (Signed)
Subjective:     Patient ID: [name], female   DOB: 04/11/1990, 23 y.o.   MRN: [id number]  HPI Mary SpruceMeghan is a 23 y.o. woman returning for f/u for weight gain. Last visit 6 mo ago.  She lost 30 lbs with the plant-based diet before last visit, but she is now eating more meat, but did not gain weight back, which is great! She tells me she had more depression and she has been working with Dr Donell BeersPlovsky on this. She is now off the amphetamine ADHD meds. She is also determined to get back on the vegan diet. She returns to see nutrition on an as needed basis.  She had with severe dysmenorrhea and menorrhagia. Her menstrual cycles were also irregular >> on OCP (Sprintec). She takes them continuously. Pain and bleeding is much better.   At last visit, we checked TFTs and they were normal and we planned to repeat them today:   Component     Latest Ref Rng 07/18/2012  TSH     0.35 - 5.50 uIU/mL 3.10  Free T4     0.60 - 1.60 ng/dL [id number]  T3, Free     2.3 - 4.2 pg/mL 2.7   We also checked a HbA1c and this was in the prediabetic range:  Lab Results  Component Value Date   HGBA1C 5.8 07/18/2012   I reviewed pt's medications, allergies, PMH, social hx, family hx and no changes required, except as mentioned above.   Review of Systems Constitutional: no weight loss/gain, + fatigue, + hot flushes, + poor sleep Eyes: no blurry vision, no xerophthalmia ENT: + sore throat, no nodules palpated in throat, no dysphagia/odynophagia, no hoarseness, + tinnistus Cardiovascular: + CP/+ SOB/palpitations/leg swelling Respiratory: no cough/+ SOB Gastrointestinal: +N/no V/+ D/no C/npo GERD Musculoskeletal: no muscle aches/no joint aches Skin: no rashes; + hair loss, + easy bruising Neurological: no tremors/numbness/tingling/dizziness, + HA  Objective:   Physical Exam BP 118/68  Pulse 87  Temp(Src) 98.5 F (36.9 C) (Oral)  Resp 12  Wt 166 lb (75.297 kg)  SpO2 96% Wt Readings from Last 3 Encounters:  07/26/13 166  lb (75.297 kg)  03/26/13 163 lb 4.8 oz (74.072 kg)  02/14/13 164 lb (74.39 kg)   Constitutional: overweight, in NAD Eyes: PERRLA, EOMI, no exophthalmos ENT: moist mucous membranes, no thyromegaly, no cervical lymphadenopathy Cardiovascular: RRR, No MRG Respiratory: CTA B Gastrointestinal: abdomen soft, NT, ND, BS+ Musculoskeletal: no deformities, strength intact in all 4 Skin: moist, warm, acanthosis nigricans on neck Neurological: no tremor with outstretched hands, DTR normal in all 4  Assessment:     1. Weight gain   2. Insulin resistance    Plan:     Pt doing very well on a plant-based diet,  she lost 30 lbs before last appt. Even though she did not keep the diet 2/2 depression, she still did not gain the weight back, which is great. She is now feeling much better and is determined to restart the diet. - continue to work with the nutritionist prn - one of the reasons for this appt was to recheck her TFTs and HbA1c but she has needle phobia and she would like to avoid having lab draws since her mom cannot be here today >> we will check these when she returns - I will see the patient back in 6 months for evaluation, will check TFTs then along with a new HbA1c

## 2013-07-26 NOTE — Patient Instructions (Signed)
Please come back in 6 months. Please restart the vegan diet.

## 2013-08-10 ENCOUNTER — Ambulatory Visit (INDEPENDENT_AMBULATORY_CARE_PROVIDER_SITE_OTHER): Payer: 59 | Admitting: Psychiatry

## 2013-08-10 DIAGNOSIS — F909 Attention-deficit hyperactivity disorder, unspecified type: Secondary | ICD-10-CM

## 2013-08-10 MED ORDER — DEXMETHYLPHENIDATE HCL ER 5 MG PO CP24: ORAL_CAPSULE | ORAL | Status: DC

## 2013-08-10 MED ORDER — SERTRALINE HCL 100 MG PO TABS: 100.0000 mg | ORAL_TABLET | Freq: Every day | ORAL | Status: DC

## 2013-08-10 MED ORDER — LAMOTRIGINE 200 MG PO TABS: 200.0000 mg | ORAL_TABLET | Freq: Every day | ORAL | Status: DC

## 2013-08-10 NOTE — Progress Notes (Signed)
[facility]  08/10/2013 11:53 AM Inda MerlinMeghan Johns  MRN:  [id number] Subjective:  Doing fair Today the patient denies being depressed. She is sleeping well but gets woken up by her family in the middle the night. She is inconsistent about her appetite. She is a vague historian. Concerning [place] she says she lost her Focalin prescription. She got it filled but lost the bottle. I will give her one more opportunity. We have prescribed one more month of Focalin and she'll call back to let us know how it's working. I am not clear that she truly needs this agent. She's been on stimulants since a young child. The patient denies use of alcohol or drugs. Her energy level is good. She denies a feeling of worthlessness. She's not suicidal. Her mood is stable. She's not euphoric or manic. Noted that she missed her last visit. The patient is starting back into school and I do believe she wants to Focalin to help her in her performance. Diagnosis:   DSM5: Schizophrenia Disorders:   Obsessive-Compulsive Disorders:   Trauma-Stressor Disorders:   Substance/Addictive Disorders:   Depressive Disorders:   Total Time spent with patient:   Axis I: ADHD, inattentive type  ADL's:  Intact  Sleep: Good  Appetite:  Good  Suicidal Ideation:  no Homicidal Ideation:  none AEB (as evidenced by):  Psychiatric Specialty Exam: Physical Exam  ROS  There were no vitals taken for this visit.There is no weight on file to calculate BMI.  General Appearance: Casual  Eye Contact::  Good  Speech:  Clear and Coherent  Volume:  Normal  Mood:  Depressed  Affect:  Congruent  Thought Process:  Goal Directed  Orientation:  Full (Time, Place, and Person)  Thought Content:  WDL  Suicidal Thoughts:  No  Homicidal Thoughts:  No  Memory:  NA  Judgement:  Good  Insight:  Fair  Psychomotor Activity:  Normal  Concentration:  NA  Recall:  Good  Fund of Knowledge:Fair  Language: Good  Akathisia:  No  Handed:  Right   AIMS (if indicated):     Assets:  Desire for Improvement  Sleep:      Musculoskeletal: Strength & Muscle Tone:  Gait & Station:  Patient leans:   Current Medications: Current Outpatient Prescriptions  Medication Sig Dispense Refill  . acetaminophen (TYLENOL) 500 MG tablet Take 500 mg by mouth every 6 (six) hours as needed for pain.      Marland Kitchen. ALPRAZolam (XANAX) 0.5 MG tablet Take 0.5 mg by mouth as needed for sleep.      Marland Kitchen. dexmethylphenidate (FOCALIN XR) 5 MG 24 hr capsule 2 qam  Fill after3/04/2013  60 capsule  0  . lamoTRIgine (LAMICTAL) 200 MG tablet Take 1 tablet (200 mg total) by mouth daily after supper.  30 tablet  5  . lisdexamfetamine (VYVANSE) 70 MG capsule Take 70 mg by mouth daily as needed (only takes during school year).       . meclizine (ANTIVERT) 50 MG tablet Take 0.5 tablets (25 mg total) by mouth 3 (three) times daily as needed for dizziness or nausea.  30 tablet  0  . methylphenidate (DAYTRANA) 30 MG/9HR Place 1 patch onto the skin daily. wear patch for 9 hours only each day  30 patch  0  . Naproxen Sodium (ALEVE PO) Take 2 tablets by mouth daily as needed (for pain).      . norgestimate-ethinyl estradiol (SPRINTEC 28) 0.25-35 MG-MCG tablet Take 1 tablet by mouth daily.      .Marland Kitchen  sertraline (ZOLOFT) 100 MG tablet Take 1 tablet (100 mg total) by mouth daily.  45 tablet  5  . traZODone (DESYREL) 50 MG tablet TAKE 1 & 1/2 TABLETS BY MOUTH AT BEDTIME.  45 tablet  1   No current facility-administered medications for this visit.    Lab Results: No results found for this or any previous visit (from the past 48 hour(s)).  Physical Findings: AIMS:  , ,  ,  ,    CIWA:    COWS:     Treatment Plan Summary: At this time the patient will be making attempts to get a new therapist. She has the name of someone that her previous therapist here get her. At this time we will be cautious about Focalin. We'll go ahead and give her another month's supply and asked her contact us over the  phone to let us know how she's doing. I am interested in the patient trying Strattera. The patient will continue taking Lamictal and Zoloft as ordered. She will return to see me in 2 months.  Plan:  Medical Decision Making Problem Points:  Established problem, worsening (2) Data Points:  Review of new medications or change in dosage (2)  I certify that inpatient services furnished can reasonably be expected to improve the patient's condition.   Lizbet Cirrincione IRVING 08/10/2013, 11:53 AM

## 2013-08-13 ENCOUNTER — Other Ambulatory Visit (HOSPITAL_COMMUNITY): Payer: Self-pay | Admitting: Psychiatry

## 2013-09-23 ENCOUNTER — Emergency Department (HOSPITAL_COMMUNITY)
Admission: EM | Admit: 2013-09-23 | Discharge: 2013-09-23 | Disposition: A | Discharge status: Home / Self Care | Payer: 59 | Attending: Emergency Medicine | Admitting: Emergency Medicine

## 2013-09-23 ENCOUNTER — Encounter (HOSPITAL_COMMUNITY): Payer: Self-pay | Admitting: Emergency Medicine

## 2013-09-23 ENCOUNTER — Emergency Department (HOSPITAL_COMMUNITY): Payer: 59

## 2013-09-23 DIAGNOSIS — S93409A Sprain of unspecified ligament of unspecified ankle, initial encounter: Secondary | ICD-10-CM | POA: Insufficient documentation

## 2013-09-23 DIAGNOSIS — F411 Generalized anxiety disorder: Secondary | ICD-10-CM | POA: Diagnosis not present

## 2013-09-23 DIAGNOSIS — S99919A Unspecified injury of unspecified ankle, initial encounter: Secondary | ICD-10-CM

## 2013-09-23 DIAGNOSIS — F3289 Other specified depressive episodes: Secondary | ICD-10-CM | POA: Insufficient documentation

## 2013-09-23 DIAGNOSIS — Y9289 Other specified places as the place of occurrence of the external cause: Secondary | ICD-10-CM | POA: Diagnosis not present

## 2013-09-23 DIAGNOSIS — F909 Attention-deficit hyperactivity disorder, unspecified type: Secondary | ICD-10-CM | POA: Diagnosis not present

## 2013-09-23 DIAGNOSIS — Z79899 Other long term (current) drug therapy: Secondary | ICD-10-CM | POA: Diagnosis not present

## 2013-09-23 DIAGNOSIS — Z8719 Personal history of other diseases of the digestive system: Secondary | ICD-10-CM | POA: Insufficient documentation

## 2013-09-23 DIAGNOSIS — F329 Major depressive disorder, single episode, unspecified: Secondary | ICD-10-CM | POA: Insufficient documentation

## 2013-09-23 DIAGNOSIS — E669 Obesity, unspecified: Secondary | ICD-10-CM | POA: Insufficient documentation

## 2013-09-23 DIAGNOSIS — S93401A Sprain of unspecified ligament of right ankle, initial encounter: Secondary | ICD-10-CM

## 2013-09-23 DIAGNOSIS — S99929A Unspecified injury of unspecified foot, initial encounter: Secondary | ICD-10-CM | POA: Diagnosis present

## 2013-09-23 DIAGNOSIS — X500XXA Overexertion from strenuous movement or load, initial encounter: Secondary | ICD-10-CM | POA: Diagnosis not present

## 2013-09-23 DIAGNOSIS — S8990XA Unspecified injury of unspecified lower leg, initial encounter: Secondary | ICD-10-CM | POA: Insufficient documentation

## 2013-09-23 DIAGNOSIS — Z8669 Personal history of other diseases of the nervous system and sense organs: Secondary | ICD-10-CM | POA: Insufficient documentation

## 2013-09-23 DIAGNOSIS — Y9389 Activity, other specified: Secondary | ICD-10-CM | POA: Diagnosis not present

## 2013-09-23 MED ORDER — OXYCODONE-ACETAMINOPHEN 5-325 MG PO TABS
2.0000 | ORAL_TABLET | Freq: Once | ORAL | Status: AC
  Administered 2013-09-23: 1 via ORAL
  Filled 2013-09-23: qty 2

## 2013-09-23 MED ORDER — OXYCODONE-ACETAMINOPHEN 5-325 MG PO TABS: 1.0000 | ORAL_TABLET | Freq: Four times a day (QID) | ORAL | Status: DC | PRN

## 2013-09-23 MED ORDER — IBUPROFEN 800 MG PO TABS: 800.0000 mg | ORAL_TABLET | Freq: Three times a day (TID) | ORAL | Status: DC

## 2013-09-23 NOTE — ED Provider Notes (Signed)
CSN: [id number]     Arrival date & time 09/23/13  1710 History  This chart was scribed for non-physician practitioner, Francee Piccolo, PA-C working with Linwood Dibbles, MD by Greggory Stallion, ED scribe. This patient was seen in room WTR7/WTR7 and the patient's care was started at 5:44 PM.   Chief Complaint  Patient presents with  . Ankle Injury   The history is provided by the patient. No language interpreter was used.   HPI Comments: [name] is a 23 y.o. female who presents to the Emergency Department complaining of right ankle injury that occurred about half an hour ago. Pt states she tripped, rolled her ankle and heard a pop. She has sudden onset pain with associated swelling. Bearing weight and certain movements worsen the pain. Pt has iced with little relief. Denies numbness or tingling.    Past Medical History  Diagnosis Date  . Depression   . Anxiety   . ADHD (attention deficit hyperactivity disorder)   . Obesity   . GERD (gastroesophageal reflux disease)   . Environmental allergies   . Myopia of both eyes    History reviewed. No pertinent past surgical history. Family History  Problem Relation Age of Onset  . Bipolar disorder Father   . Bipolar disorder Brother   . Depression Mother   . Bipolar disorder Paternal Aunt    History  Substance Use Topics  . Smoking status: Never Smoker   . Smokeless tobacco: Not on file  . Alcohol Use: No   OB History   Grav Para Term Preterm Abortions TAB SAB Ect Mult Living                 Review of Systems  Musculoskeletal: Positive for arthralgias and joint swelling.  Neurological: Negative for numbness.  All other systems reviewed and are negative.  Allergies  Cymbalta and Daytrana  Home Medications   Prior to Admission medications   Medication Sig Start Date End Date Taking? Authorizing Provider  acetaminophen (TYLENOL) 500 MG tablet Take 500 mg by mouth every 6 (six) hours as needed for pain.    Historical Provider,  MD  ALPRAZolam Prudy Feeler) 0.5 MG tablet Take 0.5 mg by mouth as needed for sleep.    Historical Provider, MD  dexmethylphenidate (FOCALIN XR) 5 MG 24 hr capsule 2 qam  Fill after3/04/2013 08/10/13   Archer Asa, MD  ibuprofen (ADVIL,MOTRIN) 800 MG tablet Take 1 tablet (800 mg total) by mouth 3 (three) times daily. 09/23/13   Jann Milkovich L Moua Rasmusson, PA-C  lamoTRIgine (LAMICTAL) 200 MG tablet Take 1 tablet (200 mg total) by mouth daily after supper. 08/10/13   Archer Asa, MD  lisdexamfetamine (VYVANSE) 70 MG capsule Take 70 mg by mouth daily as needed (only takes during school year).     Historical Provider, MD  meclizine (ANTIVERT) 50 MG tablet Take 0.5 tablets (25 mg total) by mouth 3 (three) times daily as needed for dizziness or nausea. 08/20/12   Junius Finner, PA-C  methylphenidate (DAYTRANA) 30 MG/9HR Place 1 patch onto the skin daily. wear patch for 9 hours only each day 10/11/12   Jorje Guild, PA-C  Naproxen Sodium (ALEVE PO) Take 2 tablets by mouth daily as needed (for pain).    Historical Provider, MD  norgestimate-ethinyl estradiol (SPRINTEC 28) 0.25-35 MG-MCG tablet Take 1 tablet by mouth daily.    Historical Provider, MD  oxyCODONE-acetaminophen (PERCOCET) 5-325 MG per tablet Take 1-2 tablets by mouth every 6 (six) hours as needed for severe pain. 09/23/13  Suleika Donavan L Jarian Longoria, PA-C  sertraline (ZOLOFT) 100 MG tablet Take 1 tablet (100 mg total) by mouth daily. 08/10/13   Archer Asa, MD  traZODone (DESYREL) 50 MG tablet TAKE 1 & 1/2 TABLETS BY MOUTH AT BEDTIME.    Archer Asa, MD   BP 118/67  Pulse 86  Temp(Src) 98.2 F (36.8 C) (Oral)  SpO2 100%  Physical Exam  Nursing note and vitals reviewed. Constitutional: She is oriented to person, place, and time. She appears well-developed and well-nourished. No distress.  HENT:  Head: Normocephalic and atraumatic.  Right Ear: External ear normal.  Left Ear: External ear normal.  Nose: Nose normal.  Mouth/Throat: Oropharynx is  clear and moist.  Eyes: Conjunctivae are normal.  Neck: Normal range of motion. Neck supple.  Cardiovascular: Normal rate, regular rhythm and normal heart sounds.   Pulmonary/Chest: Effort normal and breath sounds normal. No respiratory distress. She has no wheezes. She has no rales.  Abdominal: Soft.  Musculoskeletal:       Right ankle: She exhibits decreased range of motion and swelling. Tenderness. Lateral malleolus tenderness found. No head of 5th metatarsal and no proximal fibula tenderness found.       Left ankle: Normal.       Right foot: Normal.       Left foot: Normal.       Feet:  Neurological: She is alert and oriented to person, place, and time.  Skin: Skin is warm and dry. She is not diaphoretic.  Psychiatric: She has a normal mood and affect.    ED Course  Procedures (including critical care time) Medications  oxyCODONE-acetaminophen (PERCOCET/ROXICET) 5-325 MG per tablet 2 tablet (1 tablet Oral Given 09/23/13 1758)    DIAGNOSTIC STUDIES: Oxygen Saturation is 100% on RA, normal by my interpretation.    COORDINATION OF CARE: 5:46 PM-Discussed treatment plan which includes xray and pain medication with pt at bedside and pt agreed to plan.   Labs Review Labs Reviewed - No data to display  Imaging Review Dg Ankle Complete Right  09/23/2013   CLINICAL DATA:  Right ankle injury. Twisted and heard popping sound. Swelling laterally.  EXAM: RIGHT ANKLE - COMPLETE 3+ VIEW  COMPARISON:  None.  FINDINGS: There is no evidence of fracture or dislocation. Prominent soft tissue swelling is seen adjacent to the lateral malleolus.  IMPRESSION: Prominent lateral soft tissue swelling. No acute fracture identified.   Electronically Signed   By: Britta Mccreedy M.D.   On: 09/23/2013 18:32     EKG Interpretation None      MDM   Final diagnoses:  Right ankle sprain, initial encounter    Filed Vitals:   09/23/13 1716  BP: 118/67  Pulse: 86  Temp: 98.2 F (36.8 C)   Afebrile,  NAD, non-toxic appearing, AAOx4.  Neurovascularly intact. Normal sensation. Patient X-Ray negative for obvious fracture or dislocation. Pain managed in ED. Pt advised to follow up with orthopedics if symptoms persist for possibility of missed fracture diagnosis. Patient given brace while in ED, conservative therapy recommended and discussed. Patient will be dc home & is agreeable with above plan. Patient is stable at time of discharge    I personally performed the services described in this documentation, which was scribed in my presence. The recorded information has been reviewed and is accurate.  Jeannetta Ellis, PA-C 09/23/13 1937

## 2013-09-23 NOTE — Discharge Instructions (Signed)
Please follow up with your primary care physician in 1-2 days. If you do not have one please call the [facility] number listed above. Please follow up with Dr. Roda Shutters to schedule a follow up appointment.  Please use the crutches and the boot and be non-weight bearing until cleared by either your primary care doctor or Dr. Roda Shutters. Please follow the RICE method below. Please read all discharge instructions and return precautions.    Ankle Sprain An ankle sprain is an injury to the strong, fibrous tissues (ligaments) that hold the bones of your ankle joint together.  CAUSES An ankle sprain is usually caused by a fall or by twisting your ankle. Ankle sprains most commonly occur when you step on the outer edge of your foot, and your ankle turns inward. People who participate in sports are more prone to these types of injuries.  SYMPTOMS   Pain in your ankle. The pain may be present at rest or only when you are trying to stand or walk.  Swelling.  Bruising. Bruising may develop immediately or within 1 to 2 days after your injury.  Difficulty standing or walking, particularly when turning corners or changing directions. DIAGNOSIS  Your caregiver will ask you details about your injury and perform a physical exam of your ankle to determine if you have an ankle sprain. During the physical exam, your caregiver will press on and apply pressure to specific areas of your foot and ankle. Your caregiver will try to move your ankle in certain ways. An X-ray exam may be done to be sure a bone was not broken or a ligament did not separate from one of the bones in your ankle (avulsion fracture).  TREATMENT  Certain types of braces can help stabilize your ankle. Your caregiver can make a recommendation for this. Your caregiver may recommend the use of medicine for pain. If your sprain is severe, your caregiver may refer you to a surgeon who helps to restore function to parts of your skeletal system  (orthopedist) or a physical therapist. HOME CARE INSTRUCTIONS   Apply ice to your injury for 1-2 days or as directed by your caregiver. Applying ice helps to reduce inflammation and pain.  Put ice in a plastic bag.  Place a towel between your skin and the bag.  Leave the ice on for 15-20 minutes at a time, every 2 hours while you are awake.  Only take over-the-counter or prescription medicines for pain, discomfort, or fever as directed by your caregiver.  Elevate your injured ankle above the level of your heart as much as possible for 2-3 days.  If your caregiver recommends crutches, use them as instructed. Gradually put weight on the affected ankle. Continue to use crutches or a cane until you can walk without feeling pain in your ankle.  If you have a plaster splint, wear the splint as directed by your caregiver. Do not rest it on anything harder than a pillow for the first 24 hours. Do not put weight on it. Do not get it wet. You may take it off to take a shower or bath.  You may have been given an elastic bandage to wear around your ankle to provide support. If the elastic bandage is too tight (you have numbness or tingling in your foot or your foot becomes cold and blue), adjust the bandage to make it comfortable.  If you have an air splint, you may blow more air into it or let air  out to make it more comfortable. You may take your splint off at night and before taking a shower or bath. Wiggle your toes in the splint several times per day to decrease swelling. SEEK MEDICAL CARE IF:   You have rapidly increasing bruising or swelling.  Your toes feel extremely cold or you lose feeling in your foot.  Your pain is not relieved with medicine. SEEK IMMEDIATE MEDICAL CARE IF:  Your toes are numb or blue.  You have severe pain that is increasing. MAKE SURE YOU:   Understand these instructions.  Will watch your condition.  Will get help right away if you are not doing well or get  worse. Document Released: 12/28/2004 Document Revised: 09/22/2011 Document Reviewed: 01/09/2011 [facility] Patient Information 2015 [place], [place]. This information is not intended to replace advice given to you by your health care provider. Make sure you discuss any questions you have with your health care provider. RICE: Routine Care for Injuries The routine care of many injuries includes Rest, Ice, Compression, and Elevation (RICE). HOME CARE INSTRUCTIONS  Rest is needed to allow your body to heal. Routine activities can usually be resumed when comfortable. Injured tendons and bones can take up to 6 weeks to heal. Tendons are the cord-like structures that attach muscle to bone.  Ice following an injury helps keep the swelling down and reduces pain.  Put ice in a plastic bag.  Place a towel between your skin and the bag.  Leave the ice on for 15-20 minutes, 3-4 times a day, or as directed by your health care provider. Do this while awake, for the first 24 to 48 hours. After that, continue as directed by your caregiver.  Compression helps keep swelling down. It also gives support and helps with discomfort. If an elastic bandage has been applied, it should be removed and reapplied every 3 to 4 hours. It should not be applied tightly, but firmly enough to keep swelling down. Watch fingers or toes for swelling, bluish discoloration, coldness, numbness, or excessive pain. If any of these problems occur, remove the bandage and reapply loosely. Contact your caregiver if these problems continue.  Elevation helps reduce swelling and decreases pain. With extremities, such as the arms, hands, legs, and feet, the injured area should be placed near or above the level of the heart, if possible. SEEK IMMEDIATE MEDICAL CARE IF:  You have persistent pain and swelling.  You develop redness, numbness, or unexpected weakness.  Your symptoms are getting worse rather than improving after several days. These  symptoms may indicate that further evaluation or further X-rays are needed. Sometimes, X-rays may not show a small broken bone (fracture) until 1 week or 10 days later. Make a follow-up appointment with your caregiver. Ask when your X-ray results will be ready. Make sure you get your X-ray results. Document Released: 04/11/2000 Document Revised: 01/02/2013 Document Reviewed: 05/29/2010 [facility] Patient Information 2015 [place], [place]. This information is not intended to replace advice given to you by your health care provider. Make sure you discuss any questions you have with your health care provider.

## 2013-09-23 NOTE — ED Notes (Signed)
Pt states approx. 15-20 mins ago she rolled her R ankle and it is now swollen and has an abrasion. No bleeding at this time. Superficial cuts on hands.

## 2013-09-26 NOTE — ED Provider Notes (Signed)
Medical screening examination/treatment/procedure(s) were performed by non-physician practitioner and as supervising physician I was immediately available for consultation/collaboration.   EKG Interpretation None        Ritha Sampedro, MD 09/26/13 1507 

## 2013-10-10 ENCOUNTER — Ambulatory Visit (INDEPENDENT_AMBULATORY_CARE_PROVIDER_SITE_OTHER): Payer: 59 | Admitting: Psychiatry

## 2013-10-10 VITALS — BP 125/60 | HR 82 | Ht 62.0 in | Wt 170.8 lb

## 2013-10-10 DIAGNOSIS — F909 Attention-deficit hyperactivity disorder, unspecified type: Secondary | ICD-10-CM

## 2013-10-10 MED ORDER — TRAZODONE HCL 50 MG PO TABS: ORAL_TABLET | ORAL | Status: DC

## 2013-10-10 MED ORDER — SERTRALINE HCL 100 MG PO TABS: 100.0000 mg | ORAL_TABLET | Freq: Every day | ORAL | Status: DC

## 2013-10-10 MED ORDER — LAMOTRIGINE 200 MG PO TABS: 200.0000 mg | ORAL_TABLET | Freq: Every day | ORAL | Status: DC

## 2013-10-10 MED ORDER — ATOMOXETINE HCL 40 MG PO CAPS: ORAL_CAPSULE | ORAL | Status: DC

## 2013-10-10 NOTE — Progress Notes (Signed)
[facility]  10/10/2013 3:31 PM Inda MerlinMeghan Sobotta  MRN:  [id number] Subjective:  Hurt ankle Today the patient is doing fairly well. She says the Focalin did not help. Today she agreed to start on Strattera. The patient is sleeping and eating well. Her mood is stable. The patient uses no drugs or alcohol. She continues to be in school at [employer]. Overall her mood is stable. We will go ahead and start her on Strattera 40 mg and increase it after week 80. Diagnosis:   DSM5: Schizophrenia Disorders:   Obsessive-Compulsive Disorders:   Trauma-Stressor Disorders:   Substance/Addictive Disorders:   Depressive Disorders:   Total Time spent with patient:   Axis I: ADHD, combined type  ADL's:  Intact  Sleep: Good  Appetite:  Good  Suicidal Ideation:  no Homicidal Ideation:  npone AEB (as evidenced by):  Psychiatric Specialty Exam: Physical Exam  ROS  Blood pressure 125/60, pulse 82, height 5\' 2"  (1.575 m), weight 170 lb 12.8 oz (77.474 kg).Body mass index is 31.23 kg/(m^2).  General Appearance: Casual and Disheveled  Eye Contact::  Good  Speech:  Clear and Coherent  Volume:  Normal  Mood:  Euphoric  Affect:  Congruent  Thought Process:  Goal Directed  Orientation:  NA  Thought Content:  WDL  Suicidal Thoughts:  No  Homicidal Thoughts:  No  Memory:  NA  Judgement:  NA  Insight:  NA  Psychomotor Activity:  NA  Concentration:  NA  Recall:  NA  Fund of Knowledge:Good  Language: Good  Akathisia:  No  Handed:  Right  AIMS (if indicated):     Assets:  Communication Skills  Sleep:      Musculoskeletal: Strength & Muscle Tone:  Gait & Station:  Patient leans:   Current Medications: Current Outpatient Prescriptions  Medication Sig Dispense Refill  . acetaminophen (TYLENOL) 500 MG tablet Take 500 mg by mouth every 6 (six) hours as needed for pain.      Marland Kitchen. ALPRAZolam (XANAX) 0.5 MG tablet Take 0.5 mg by mouth as needed for sleep.      Marland Kitchen. atomoxetine (STRATTERA) 40 MG  capsule 1 qam for 1week then 1 qam 1 q 4;00  60 capsule  5  . dexmethylphenidate (FOCALIN XR) 5 MG 24 hr capsule 2 qam  Fill after3/04/2013  60 capsule  0  . ibuprofen (ADVIL,MOTRIN) 800 MG tablet Take 1 tablet (800 mg total) by mouth 3 (three) times daily.  21 tablet  0  . lamoTRIgine (LAMICTAL) 200 MG tablet Take 1 tablet (200 mg total) by mouth daily after supper.  30 tablet  5  . lisdexamfetamine (VYVANSE) 70 MG capsule Take 70 mg by mouth daily as needed (only takes during school year).       . meclizine (ANTIVERT) 50 MG tablet Take 0.5 tablets (25 mg total) by mouth 3 (three) times daily as needed for dizziness or nausea.  30 tablet  0  . methylphenidate (DAYTRANA) 30 MG/9HR Place 1 patch onto the skin daily. wear patch for 9 hours only each day  30 patch  0  . Naproxen Sodium (ALEVE PO) Take 2 tablets by mouth daily as needed (for pain).      . norgestimate-ethinyl estradiol (SPRINTEC 28) 0.25-35 MG-MCG tablet Take 1 tablet by mouth daily.      Marland Kitchen. oxyCODONE-acetaminophen (PERCOCET) 5-325 MG per tablet Take 1-2 tablets by mouth every 6 (six) hours as needed for severe pain.  20 tablet  0  . sertraline (  ZOLOFT) 100 MG tablet Take 1 tablet (100 mg total) by mouth daily.  30 tablet  5  . traZODone (DESYREL) 50 MG tablet TAKE 1 & 1/2 TABLETS BY MOUTH AT BEDTIME.  45 tablet  1   No current facility-administered medications for this visit.    Lab Results: No results found for this or any previous visit (from the past 48 hour(s)).  Physical Findings: AIMS:  , ,  ,  ,    CIWA:    COWS:     Treatment Plan Summary: At this time the patient will begin on Strattera 40 mg for one week and then increase it to one in the morning and one at noon. The patient to return to see me in 2 months at that time she'll have a full reevaluation.  Plan:  Medical Decision Making Problem Points:  Established problem, worsening (2) Data Points:  Review of new medications or change in dosage (2)  I certify that  inpatient services furnished can reasonably be expected to improve the patient's condition.   Aadhav Uhlig IRVING 10/10/2013, 3:31 PM

## 2013-10-26 ENCOUNTER — Other Ambulatory Visit: Payer: Self-pay

## 2013-12-05 ENCOUNTER — Ambulatory Visit (INDEPENDENT_AMBULATORY_CARE_PROVIDER_SITE_OTHER): Payer: 59 | Admitting: Psychiatry

## 2013-12-05 VITALS — BP 113/56 | HR 72 | Ht 62.0 in | Wt 170.2 lb

## 2013-12-05 DIAGNOSIS — F900 Attention-deficit hyperactivity disorder, predominantly inattentive type: Secondary | ICD-10-CM

## 2013-12-05 DIAGNOSIS — F988 Other specified behavioral and emotional disorders with onset usually occurring in childhood and adolescence: Secondary | ICD-10-CM

## 2013-12-05 MED ORDER — ALPRAZOLAM 0.5 MG PO TABS: 0.5000 mg | ORAL_TABLET | ORAL | Status: DC | PRN

## 2013-12-05 MED ORDER — SERTRALINE HCL 100 MG PO TABS: 100.0000 mg | ORAL_TABLET | Freq: Every day | ORAL | Status: DC

## 2013-12-05 MED ORDER — LAMOTRIGINE 200 MG PO TABS: 200.0000 mg | ORAL_TABLET | Freq: Every day | ORAL | Status: DC

## 2013-12-05 MED ORDER — TRAZODONE HCL 50 MG PO TABS: ORAL_TABLET | ORAL | Status: DC

## 2013-12-05 NOTE — Progress Notes (Signed)
[facility]  12/05/2013 2:17 PM [name]  MRN:  [id number] Subjective:  Okay The patient could not tolerate this should tear a 40 mg she said it made her dizzy. It is noted the patient is having a hard time getting a benefit from any stimulants. I was prepared to offer he Evekeo but the patient didn't seem all that interested. In reality the patient is getting nearly A's and B's in college. At this time I suggested to her that maybe she is compensated and figured out a way to get around her tension disorder. She seemed to accept that idea. At this time I did not offer any new stimulants. The patient is not depressed she sleeping and eating well. She lost o'clock. She loves video games below spur watching. We reviewed her history and was clear that she took a mental for mood stabilizer and when she tried to stop it she became clearly unstable. For years ago the patient did experience an episode of significant depression. At some point she was begun on Zoloft which seemed to been beneficial. The patient also takes trazodone for sleep. At this time she is actually quite stable. She is enjoying life and continues in school. I think she is functioning well. This patient is not suicidal. This patient denies the use of drugs or alcohol. She is not psychotic.    Diagnosis:   DSM5: Schizophrenia Disorders:   Obsessive-Compulsive Disorders:   Trauma-Stressor Disorders:   Substance/Addictive Disorders:   Depressive Disorders:   Total Time spent with patient:   Axis I: ADHD, inattentive type  ADL's:  Intact  Sleep: Good  Appetite:  Good  Suicidal Ideation:  no Homicidal Ideation:  none AEB (as evidenced by):  Psychiatric Specialty Exam: Physical Exam  ROS  Blood pressure 113/56, pulse 72, height 5\' 2"  (1.575 m), weight 170 lb 3.2 oz (77.202 kg).Body mass index is 31.12 kg/(m^2).  General Appearance: Casual  Eye Contact::  Good  Speech:  Normal Rate  Volume:  Normal  Mood:  NA   Affect:  Appropriate  Thought Process:  Coherent  Orientation:  Full (Time, Place, and Person)  Thought Content:  WDL  Suicidal Thoughts:  No  Homicidal Thoughts:  No  Memory:  NA  Judgement:  Good  Insight:  Good  Psychomotor Activity:  Normal  Concentration:  Good  Recall:  Good  Fund of Knowledge:Good  Language: Good  Akathisia:  No  Handed:  Right  AIMS (if indicated):     Assets:  Communication Skills  Sleep:      Musculoskeletal: Strength & Muscle Tone:  Gait & Station:  Patient leans:   Current Medications: Current Outpatient Prescriptions  Medication Sig Dispense Refill  . acetaminophen (TYLENOL) 500 MG tablet Take 500 mg by mouth every 6 (six) hours as needed for pain.    Marland Kitchen. ALPRAZolam (XANAX) 0.5 MG tablet Take 1 tablet (0.5 mg total) by mouth as needed for sleep. 30 tablet 5  . atomoxetine (STRATTERA) 40 MG capsule 1 qam for 1week then 1 qam 1 q 4;00 60 capsule 5  . dexmethylphenidate (FOCALIN XR) 5 MG 24 hr capsule 2 qam  Fill after3/04/2013 60 capsule 0  . ibuprofen (ADVIL,MOTRIN) 800 MG tablet Take 1 tablet (800 mg total) by mouth 3 (three) times daily. 21 tablet 0  . lamoTRIgine (LAMICTAL) 200 MG tablet Take 1 tablet (200 mg total) by mouth daily after supper. 30 tablet 5  . lisdexamfetamine (VYVANSE) 70 MG capsule Take 70  mg by mouth daily as needed (only takes during school year).     . meclizine (ANTIVERT) 50 MG tablet Take 0.5 tablets (25 mg total) by mouth 3 (three) times daily as needed for dizziness or nausea. 30 tablet 0  . methylphenidate (DAYTRANA) 30 MG/9HR Place 1 patch onto the skin daily. wear patch for 9 hours only each day 30 patch 0  . Naproxen Sodium (ALEVE PO) Take 2 tablets by mouth daily as needed (for pain).    . norgestimate-ethinyl estradiol (SPRINTEC 28) 0.25-35 MG-MCG tablet Take 1 tablet by mouth daily.    Marland Kitchen. oxyCODONE-acetaminophen (PERCOCET) 5-325 MG per tablet Take 1-2 tablets by mouth every 6 (six) hours as needed for severe pain. 20  tablet 0  . sertraline (ZOLOFT) 100 MG tablet Take 1 tablet (100 mg total) by mouth daily. 30 tablet 5  . traZODone (DESYREL) 50 MG tablet TAKE 1 & 1/2 TABLETS BY MOUTH AT BEDTIME. 45 tablet 5   No current facility-administered medications for this visit.    Lab Results: No results found for this or any previous visit (from the past 48 hour(s)).  Physical Findings: AIMS:  , ,  ,  ,    CIWA:    COWS:     Treatment Plan Summary: At this time the patient will continue taking just her Lamictal, Zoloft and trazodone. We'll hold off on any other stimulants or treatments for attention deficit disorder. This patient return to see me in 4 months.  Plan:  Medical Decision Making Problem Points:   Data Points:   I certify that inpatient services furnished can reasonably be expected to improve the patient's condition.   [name] 12/05/2013, 2:17 PM

## 2014-01-25 ENCOUNTER — Ambulatory Visit: Payer: Self-pay | Admitting: Internal Medicine

## 2014-04-04 ENCOUNTER — Ambulatory Visit (HOSPITAL_COMMUNITY): Payer: Self-pay | Admitting: Psychiatry

## 2014-04-10 ENCOUNTER — Ambulatory Visit (INDEPENDENT_AMBULATORY_CARE_PROVIDER_SITE_OTHER): Payer: 59 | Admitting: Psychiatry

## 2014-04-10 VITALS — BP 117/71 | HR 80 | Ht 62.0 in | Wt 173.4 lb

## 2014-04-10 DIAGNOSIS — F329 Major depressive disorder, single episode, unspecified: Secondary | ICD-10-CM | POA: Diagnosis not present

## 2014-04-10 DIAGNOSIS — F988 Other specified behavioral and emotional disorders with onset usually occurring in childhood and adolescence: Secondary | ICD-10-CM

## 2014-04-10 MED ORDER — TRAZODONE HCL 50 MG PO TABS: ORAL_TABLET | ORAL | Status: DC

## 2014-04-10 MED ORDER — LAMOTRIGINE 200 MG PO TABS: 200.0000 mg | ORAL_TABLET | Freq: Every day | ORAL | Status: DC

## 2014-04-10 MED ORDER — SERTRALINE HCL 100 MG PO TABS: 100.0000 mg | ORAL_TABLET | Freq: Every day | ORAL | Status: DC

## 2014-04-10 NOTE — Progress Notes (Addendum)
[facility] MD Progress Note  04/10/2014 4:09 PM [name]  MRN:  [id number] Subjective: Doing well in school. At this time the patient is doing well her mood is good. She sleeping and eating well. She takes her medications as prescribed. The patient is not asking for stimulant and seems to be doing very well in school in fact she's getting straight A's. She is tolerating the stresses they've increased demands at school. The patient still able to enjoy herself. She started a vegetable garden. She loves to cook. The patient is not in any relationships at this time. She lives with her family. There is increased stress at home as her father recently got a job which means that there is a Geneticist, molecular of cars to drive. Overall the patient is doing well she's not drinking or using any drugs. She seems quite stable. Principal Problem: Major Depression Diagnosis:   Patient Active Problem List   Diagnosis Date Noted  . Major depressive disorder, recurrent episode, severe, without mention of psychotic behavior [F33.2] 02/14/2013  . Overweight [E66.3] 01/26/2013   Total Time spent with patient: 15 minutes   Past Medical History:  Past Medical History  Diagnosis Date  . Depression   . Anxiety   . ADHD (attention deficit hyperactivity disorder)   . Obesity   . GERD (gastroesophageal reflux disease)   . Environmental allergies   . Myopia of both eyes    No past surgical history on file. Family History:  Family History  Problem Relation Age of Onset  . Bipolar disorder Father   . Bipolar disorder Brother   . Depression Mother   . Bipolar disorder Paternal Aunt    Social History:  History  Alcohol Use No     History  Drug Use No    History   Social History  . Marital Status: Single    Spouse Name: N/A  . Number of Children: N/A  . Years of Education: N/A   Occupational History  . Student    Social History Main Topics  . Smoking status: Never Smoker   . Smokeless tobacco: Not on file  .  Alcohol Use: No  . Drug Use: No  . Sexual Activity: No   Other Topics Concern  . Not on file   Social History Narrative   07/05/12 [facility]  Kortni was born in [place], [place] and she currently lives with her mother, father, and her younger brother. She has an older brother who lives in [place]. [place]. She graduated high school. She is currently attending [employer] studying biology. She plans to return to [employer] to complete a degree in marine biology.  She is currently unemployed. 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  She denies any history of legal problems. She reports that her social support system consists of her therapist. She enjoys playing video games, but watching, cocaine, sewing, white in nature documentaries, and hiking. 07/05/12 [facility]      Regular exercise: no   Caffeine use: no   Additional History:    Sleep: Good  Appetite:  Good   Assessment:   Musculoskeletal: Strength & Muscle Tone:  Gait & Station:  Patient leans:    Psychiatric Specialty Exam: Physical Exam  ROS  Blood pressure 117/71, pulse 80, height  (1.575 m), weight 173 lb 6.4 oz (78.654 kg).Body mass index is 31.71 kg/(m^2).  General Appearance: Casual  Eye Contact::  Good  Speech:  Clear and Coherent  Volume:  Normal  Mood:  Euthymic  Affect:  NA  Thought Process:  Coherent  Orientation:  Full (Time, Place, and Person)  Thought Content:  WDL  Suicidal Thoughts:  No  Homicidal Thoughts:  No  Memory:  NA  Judgement:  NA  Insight:  Good  Psychomotor Activity:  Normal  Concentration:  NA  Recall:  Good  Fund of Knowledge:Good  Language: Good  Akathisia:  No  Handed:  Right  AIMS (if indicated):     Assets:  Desire for Improvement  ADL's:  Intact  Cognition: WNL  Sleep:        Current Medications: Current Outpatient Prescriptions  Medication Sig Dispense Refill  . acetaminophen (TYLENOL) 500 MG tablet Take 500 mg by mouth every 6 (six) hours as needed for pain.    Marland Kitchen.  ALPRAZolam (XANAX) 0.5 MG tablet Take 1 tablet (0.5 mg total) by mouth as needed for sleep. 30 tablet 5  . atomoxetine (STRATTERA) 40 MG capsule 1 qam for 1week then 1 qam 1 q 4;00 60 capsule 5  . dexmethylphenidate (FOCALIN XR) 5 MG 24 hr capsule 2 qam  Fill after3/04/2013 60 capsule 0  . ibuprofen (ADVIL,MOTRIN) 800 MG tablet Take 1 tablet (800 mg total) by mouth 3 (three) times daily. 21 tablet 0  . lamoTRIgine (LAMICTAL) 200 MG tablet Take 1 tablet (200 mg total) by mouth daily after supper. 30 tablet 5  . lisdexamfetamine (VYVANSE) 70 MG capsule Take 70 mg by mouth daily as needed (only takes during school year).     . meclizine (ANTIVERT) 50 MG tablet Take 0.5 tablets (25 mg total) by mouth 3 (three) times daily as needed for dizziness or nausea. 30 tablet 0  . methylphenidate (DAYTRANA) 30 MG/9HR Place 1 patch onto the skin daily. wear patch for 9 hours only each day 30 patch 0  . Naproxen Sodium (ALEVE PO) Take 2 tablets by mouth daily as needed (for pain).    . norgestimate-ethinyl estradiol (SPRINTEC 28) 0.25-35 MG-MCG tablet Take 1 tablet by mouth daily.    Marland Kitchen. oxyCODONE-acetaminophen (PERCOCET) 5-325 MG per tablet Take 1-2 tablets by mouth every 6 (six) hours as needed for severe pain. 20 tablet 0  . sertraline (ZOLOFT) 100 MG tablet Take 1 tablet (100 mg total) by mouth daily. 30 tablet 5  . traZODone (DESYREL) 50 MG tablet TAKE 1 & 1/2 TABLETS BY MOUTH AT BEDTIME. 45 tablet 5   No current facility-administered medications for this visit.    Lab Results: No results found for this or any previous visit (from the past 48 hour(s)).  Physical Findings: AIMS:  , ,  ,  ,    CIWA:    COWS:     Treatment Plan Summary:    Medical Decision Making:  New problem, with additional work up planned     Billey Wojciak IRVING at this time the patient will continue taking all her medications including Zoloft 100 mg Lamictal and trazodone. This patient to return to see me in 4  months. 04/10/2014, 4:09 PM

## 2014-08-14 ENCOUNTER — Ambulatory Visit (INDEPENDENT_AMBULATORY_CARE_PROVIDER_SITE_OTHER): Payer: 59 | Admitting: Psychiatry

## 2014-08-14 VITALS — BP 102/70 | HR 76 | Ht 61.7 in | Wt 176.0 lb

## 2014-08-14 DIAGNOSIS — F331 Major depressive disorder, recurrent, moderate: Secondary | ICD-10-CM

## 2014-08-14 MED ORDER — LAMOTRIGINE 200 MG PO TABS: 200.0000 mg | ORAL_TABLET | Freq: Every day | ORAL | Status: DC

## 2014-08-14 MED ORDER — TRAZODONE HCL 50 MG PO TABS: ORAL_TABLET | ORAL | Status: DC

## 2014-08-14 MED ORDER — ALPRAZOLAM 0.5 MG PO TABS: 0.5000 mg | ORAL_TABLET | ORAL | Status: DC | PRN

## 2014-08-14 MED ORDER — SERTRALINE HCL 100 MG PO TABS: 100.0000 mg | ORAL_TABLET | Freq: Every day | ORAL | Status: DC

## 2014-08-14 NOTE — Progress Notes (Signed)
[facility] MD Progress Note  08/14/2014 4:18 PM [name]  MRN:  [id number] Subjective: Bored Principal Problem: Major depression disorder, and recurrent episode mild Diagnosis:  Major depression disorder, recurrent mild Today the patient is doing fairly well. She claims that she is bored at home. Her mother works all the time and her father just got a job. That means there is no cars available for her to drive. 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 She is looking forward to getting back to school and is going to take a baking class. The patient denies daily depression. She is sleeping and eating fairly well. She's got good energy and a good sense of worth. The patient describes some mild palpitations which is unassociated with chest pain sweating or any other physical symptoms. The patient denies the use of alcohol or drugs. Generally the patient is quite stable. She gets good grades and while she might of been on stimulant the past there is no indication for it at this time. The patient is a bit evasive in general. She takes Zoloft and Lamictal most likely for clinical depression. The patient also takes Xanax when necessary for sleep. She takes trazodone also for sleep. Generally she is quite stable. Patient Active Problem List   Diagnosis Date Noted  . Major depressive disorder, recurrent episode, severe, without mention of psychotic behavior [F33.2] 02/14/2013  . Overweight(278.02) [E66.3] 01/26/2013   Total Time spent with patient: 30 minutes   Past Medical History:  Past Medical History  Diagnosis Date  . Depression   . Anxiety   . ADHD (attention deficit hyperactivity disorder)   . Obesity   . GERD (gastroesophageal reflux disease)   . Environmental allergies   . Myopia of both eyes    No past surgical history on file. Family History:  Family History  Problem Relation Age of Onset  . Bipolar disorder Father   . Bipolar disorder  Brother   . Depression Mother   . Bipolar disorder Paternal Aunt    Social History:  History  Alcohol Use No     History  Drug Use No    History   Social History  . Marital Status: Single    Spouse Name: N/A  . Number of Children: N/A  . Years of Education: N/A   Occupational History  . Student    Social History Main Topics  . Smoking status: Never Smoker   . Smokeless tobacco: Not on file  . Alcohol Use: No  . Drug Use: No  . Sexual Activity: No   Other Topics Concern  . Not on file   Social History Narrative   07/05/12 AHW  Rily was born in [place], [place] and she currently lives with her mother, father, and her younger brother. She has an older brother who lives in [place]. [place]. She graduated high school. She is currently attending [employer] studying biology. She plans to return to [employer] to complete a degree in marine biology.  She is currently unemployed. 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  She denies any history of legal problems. She reports that her social support system consists of her therapist. She enjoys playing video games, but watching, cocaine, sewing, white in nature documentaries, and hiking. 07/05/12 AHW      Regular exercise: no   Caffeine use: no   Additional History:    Sleep: Fair  Appetite:  Good   Assessment:  Musculoskeletal: Strength & Muscle Tone: within normal limits Gait & Station: normal Patient leans: Right   Psychiatric Specialty Exam: Physical Exam  ROS  Blood pressure 102/70, pulse 76, height 5' 1.7" (1.567 m), weight 176 lb (79.833 kg).Body mass index is 32.51 kg/(m^2).  General Appearance: NA  Eye Contact::  Fair  Speech:  Clear and Coherent  Volume:  Normal  Mood:  Euthymic  Affect:  Appropriate  Thought Process:  Coherent  Orientation:  Full (Time, Place, and Person)  Thought Content:  WDL  Suicidal Thoughts:  No  Homicidal Thoughts:  No  Memory:  NA  Judgement:  Fair  Insight:  Good   Psychomotor Activity:  Normal  Concentration:  Good  Recall:  Good  Fund of Knowledge:Fair  Language: Good  Akathisia:  No  Handed:  Right  AIMS (if indicated):     Assets:  Desire for Improvement  ADL's:  Intact  Cognition: WNL  Sleep:        Current Medications: Current Outpatient Prescriptions  Medication Sig Dispense Refill  . acetaminophen (TYLENOL) 500 MG tablet Take 500 mg by mouth every 6 (six) hours as needed for pain.    Marland Kitchen ALPRAZolam (XANAX) 0.5 MG tablet Take 1 tablet (0.5 mg total) by mouth as needed for sleep. 30 tablet 5  . atomoxetine (STRATTERA) 40 MG capsule 1 qam for 1week then 1 qam 1 q 4;00 60 capsule 5  . dexmethylphenidate (FOCALIN XR) 5 MG 24 hr capsule 2 qam  Fill after3/04/2013 60 capsule 0  . ibuprofen (ADVIL,MOTRIN) 800 MG tablet Take 1 tablet (800 mg total) by mouth 3 (three) times daily. 21 tablet 0  . lamoTRIgine (LAMICTAL) 200 MG tablet Take 1 tablet (200 mg total) by mouth daily after supper. 30 tablet 5  . lisdexamfetamine (VYVANSE) 70 MG capsule Take 70 mg by mouth daily as needed (only takes during school year).     . meclizine (ANTIVERT) 50 MG tablet Take 0.5 tablets (25 mg total) by mouth 3 (three) times daily as needed for dizziness or nausea. 30 tablet 0  . methylphenidate (DAYTRANA) 30 MG/9HR Place 1 patch onto the skin daily. wear patch for 9 hours only each day 30 patch 0  . Naproxen Sodium (ALEVE PO) Take 2 tablets by mouth daily as needed (for pain).    . norgestimate-ethinyl estradiol (SPRINTEC 28) 0.25-35 MG-MCG tablet Take 1 tablet by mouth daily.    Marland Kitchen oxyCODONE-acetaminophen (PERCOCET) 5-325 MG per tablet Take 1-2 tablets by mouth every 6 (six) hours as needed for severe pain. 20 tablet 0  . sertraline (ZOLOFT) 100 MG tablet Take 1 tablet (100 mg total) by mouth daily. 30 tablet 5  . traZODone (DESYREL) 50 MG tablet TAKE 1 & 1/2 TABLETS BY MOUTH AT BEDTIME. 45 tablet 5   No current facility-administered medications for this visit.     Lab Results: No results found for this or any previous visit (from the past 48 hour(s)).  Physical Findings: AIMS:  , ,  ,  ,    CIWA:    COWS:     Treatment Plan Summary: At this time the patient will continue taking Lamictal Zoloft trazodone and when necessary Xanax. The patient has a history of major depression but this time it is really not present. The patient is able to organize and focus quite well and is a good Consulting civil engineer. She describes himself as being bored at home because of a lack of stimulation. Her next evaluation we shall do a  more thorough evaluation of whether or not she's ever had a manic episode before. Generally she is doing quite well. The patient is #1 problem is that of clinical depression which seems to over mid it at least for the time being. The patient denies being suicidal and has good energy and seems to be organized fairly well. She is stable at this time. She'll return to see me in approximately 4 months.   Medical Decision Making:  Established Problem, Stable/Improving (1)     Sabriah Hobbins IRVING 08/14/2014, 4:18 PM

## 2014-12-06 ENCOUNTER — Ambulatory Visit (INDEPENDENT_AMBULATORY_CARE_PROVIDER_SITE_OTHER): Payer: 59 | Admitting: Physician Assistant

## 2014-12-06 VITALS — BP 96/64 | HR 85 | Temp 98.7°F | Resp 16 | Ht 61.2 in | Wt 181.0 lb

## 2014-12-06 DIAGNOSIS — J029 Acute pharyngitis, unspecified: Secondary | ICD-10-CM

## 2014-12-06 LAB — POCT RAPID STREP A (OFFICE): Rapid Strep A Screen: NEGATIVE

## 2014-12-06 MED ORDER — AZITHROMYCIN 250 MG PO TABS: ORAL_TABLET | ORAL | Status: AC

## 2014-12-06 NOTE — Progress Notes (Signed)
[facility] [address], [place] [phone] [phone]  Date:  12/06/2014   Name:  Mary HaymakerMeghan C [name]   DOB:  05/24/1990   MRN:  [id number]  PCP:  Gaye AlkenBARNES,ELIZABETH STEWART, MD    Chief Complaint: Sore Throat; Cough; Sneezing; and Fatigue   History of Present Illness:  This is a 24 y.o. female with PMH major depression and anxiety who is presenting with sore throat x 1 week. She has cough that started 2 days ago. She is also endorsing frequent sneezing and fatigue. She feels her sore throat is getting worse. She has felt hot and cold off and on but no documented fevers. No sob/wheezing. She is complaining of left lateral tongue soreness. Taking cough drops and helps sore throat a little. No hx asthma. Has hx env allergies. Taking zyrtec currenlty and uses air purifier in room. She has flonase at home but not currently using. She admits she is not good at drinking fluids. 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   Review of Systems:  Review of Systems See HPI  Patient Active Problem List   Diagnosis Date Noted  . Major depressive disorder, recurrent episode, severe, without mention of psychotic behavior 02/14/2013  . Overweight(278.02) 01/26/2013    Prior to Admission medications   Medication Sig Start Date End Date Taking? Authorizing Provider  acetaminophen (TYLENOL) 500 MG tablet Take 500 mg by mouth every 6 (six) hours as needed for pain.   Yes Historical Provider, MD  ALPRAZolam Prudy Feeler(XANAX) 0.5 MG tablet Take 1 tablet (0.5 mg total) by mouth as needed for sleep. 08/14/14  Yes Archer AsaGerald Plovsky, MD  ibuprofen (ADVIL,MOTRIN) 800 MG tablet Take 1 tablet (800 mg total) by mouth 3 (three) times daily. 09/23/13  Yes Jennifer Piepenbrink, PA-C  lamoTRIgine (LAMICTAL) 200 MG tablet Take 1 tablet (200 mg total) by mouth daily after supper. 08/14/14  Yes Archer AsaGerald Plovsky, MD  meclizine (ANTIVERT) 50 MG tablet Take 0.5 tablets (25 mg total) by  mouth 3 (three) times daily as needed for dizziness or nausea. 08/20/12  Yes Junius FinnerErin O'Malley, PA-C  Naproxen Sodium (ALEVE PO) Take 2 tablets by mouth daily as needed (for pain).   Yes Historical Provider, MD  norgestimate-ethinyl estradiol (SPRINTEC 28) 0.25-35 MG-MCG tablet Take 1 tablet by mouth daily.   Yes Historical Provider, MD  sertraline (ZOLOFT) 100 MG tablet Take 1 tablet (100 mg total) by mouth daily. 08/14/14  Yes Archer AsaGerald Plovsky, MD  traZODone (DESYREL) 50 MG tablet TAKE 1 & 1/2 TABLETS BY MOUTH AT BEDTIME. 08/14/14  Yes Archer AsaGerald Plovsky, MD                                       Allergies  Allergen Reactions  . Cymbalta [Duloxetine Hcl] Other (See Comments)    Hypotension  . Daytrana [Methylphenidate] Rash    Like a sunburn    History reviewed. No pertinent past surgical history.  Social History  Substance Use Topics  . Smoking status: Never Smoker   . Smokeless tobacco: None  . Alcohol Use: 0.0 oz/week    0 Standard drinks or equivalent per week     Comment: Occasionaly    Family History  Problem Relation Age of Onset  . Bipolar disorder Father   . Diabetes Father   . Hyperlipidemia Father   . Bipolar disorder Brother   .  Depression Mother   . Hyperlipidemia Mother   . Bipolar disorder Paternal Aunt   . Diabetes Maternal Grandmother   . Heart disease Maternal Grandmother   . Hyperlipidemia Maternal Grandmother   . Hypertension Maternal Grandmother   . Heart disease Maternal Grandfather   . Hyperlipidemia Maternal Grandfather   . Heart disease Paternal Grandmother   . Hypertension Paternal Grandmother   . Cancer Paternal Grandfather   . Diabetes Paternal Grandfather     Medication list has been reviewed and updated.  Physical Examination:  Physical Exam  Constitutional: She is oriented to person, place, and time. She appears well-developed and well-nourished. No distress.  HENT:  Head: Normocephalic and atraumatic.  Right Ear: Hearing, tympanic  membrane, external ear and ear canal normal.  Left Ear: Hearing, tympanic membrane, external ear and ear canal normal.  Nose: Nose normal.  Mouth/Throat: Uvula is midline and mucous membranes are normal. Posterior oropharyngeal erythema (mild) present. No oropharyngeal exudate or posterior oropharyngeal edema.  Left side of tongue with small 1 cm area of erythema, no ulceration or lesion  Eyes: Conjunctivae and lids are normal. Right eye exhibits no discharge. Left eye exhibits no discharge. No scleral icterus.  Cardiovascular: Normal rate, regular rhythm, normal heart sounds and normal pulses.   No murmur heard. Pulmonary/Chest: Effort normal and breath sounds normal. No respiratory distress. She has no wheezes. She has no rhonchi. She has no rales.  Musculoskeletal: Normal range of motion.  Lymphadenopathy:       Head (right side): No submental, no submandibular and no tonsillar adenopathy present.       Head (left side): No submental, no submandibular and no tonsillar adenopathy present.    She has no cervical adenopathy.  Neurological: She is alert and oriented to person, place, and time.  Skin: Skin is warm, dry and intact. No lesion and no rash noted.  Psychiatric: She has a normal mood and affect. Her speech is normal and behavior is normal. Thought content normal.   BP 96/64 mmHg  Pulse 85  Temp(Src) 98.7 F (37.1 C)  Resp 16  Ht 5' 1.2" (1.554 m)  Wt 181 lb (82.101 kg)  BMI 34.00 kg/m2  SpO2 98%  LMP 11/22/2014  Results for orders placed or performed in visit on 12/06/14  POCT rapid strep A  Result Value Ref Range   Rapid Strep A Screen Negative Negative   Assessment and Plan:  1. Sore throat 2. Acute pharyngitis Rapid strep negative, culture pending. Suspect viral etiology. Pt is very worried that she will be too sick for her final exams. I gave her a print out of a zpak to use if no improvement in 3 days. She will treat herself supportively in the meantime. Erythema  on tongue unimpressive, could be from burn or bite. Return if symptoms not improved in 1 week. - POCT rapid strep A - azithromycin (ZITHROMAX) 250 MG tablet; Take 2 tabs PO x 1 dose, then 1 tab PO QD x 4 days  Dispense: 6 tablet; Refill: 0   Roswell Miners. Dyke Brackett, MHS [facility]  12/06/2014

## 2014-12-06 NOTE — Patient Instructions (Signed)
Continue zyrtec. Start using flonase daily. Drink plenty of water, at least 64 oz water a day. Get plenty of rest. You may fill the antibiotic on Monday if your symptoms have not improved.

## 2014-12-08 LAB — CULTURE, GROUP A STREP: Organism ID, Bacteria: NORMAL

## 2014-12-21 IMAGING — CR DG ANKLE COMPLETE 3+V*R*
3 series · 3 of 3 positions shown · non-contrast
Comparison: None.

CLINICAL DATA: Right ankle injury. Twisted and heard popping sound.
Swelling laterally.

EXAM:
RIGHT ANKLE - COMPLETE 3+ VIEW

[x ankle ap right]
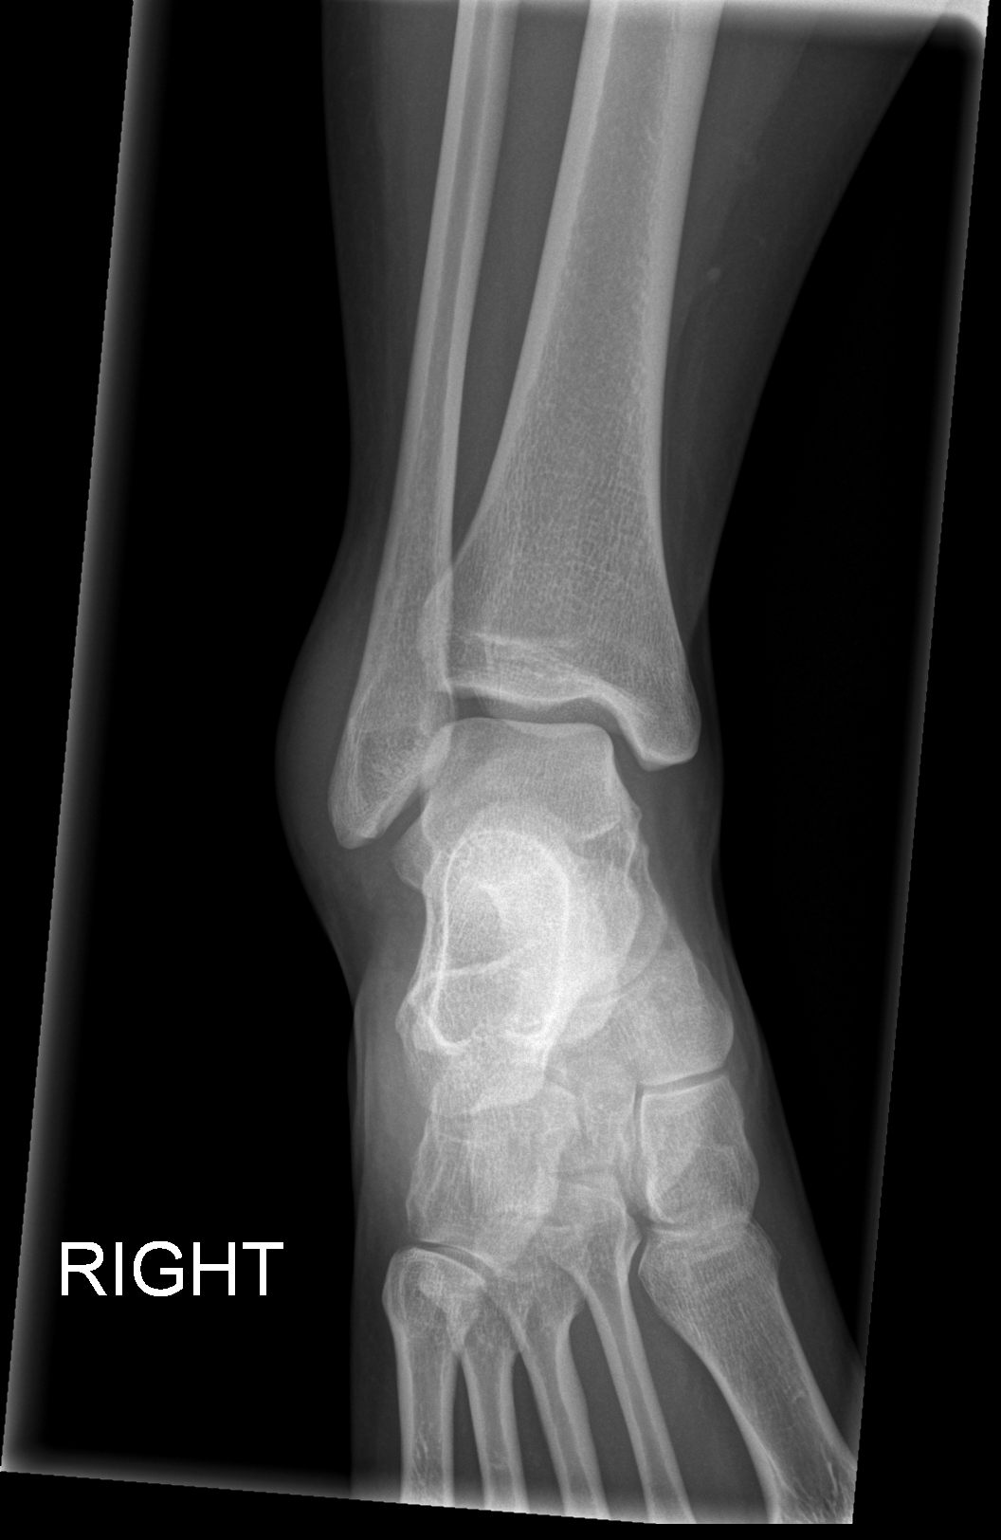

[x ankle obl right]
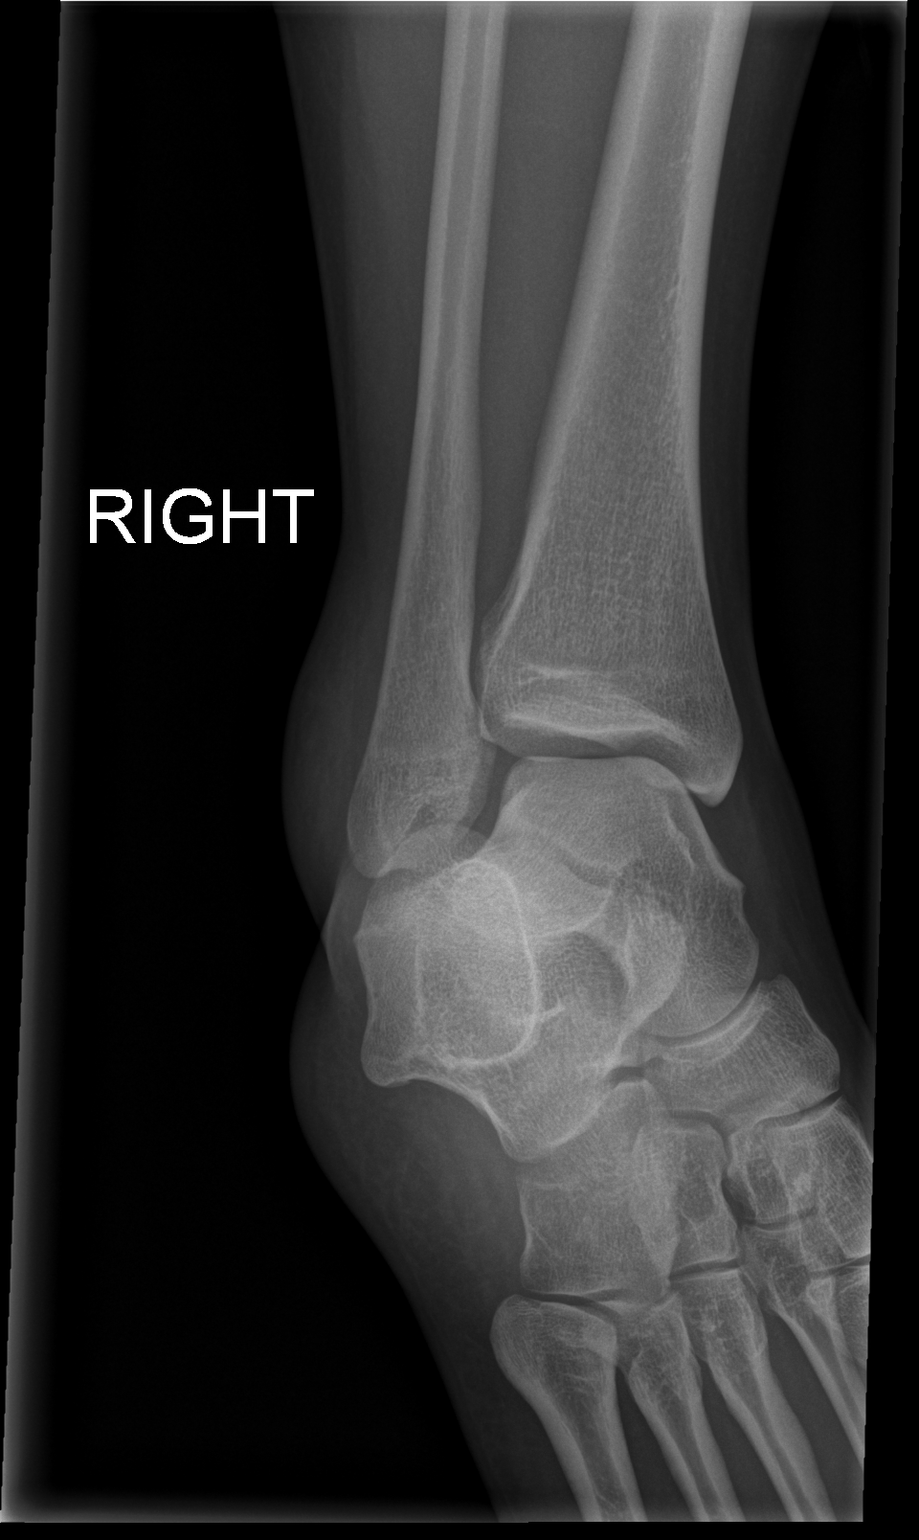

[x ankle lat right]
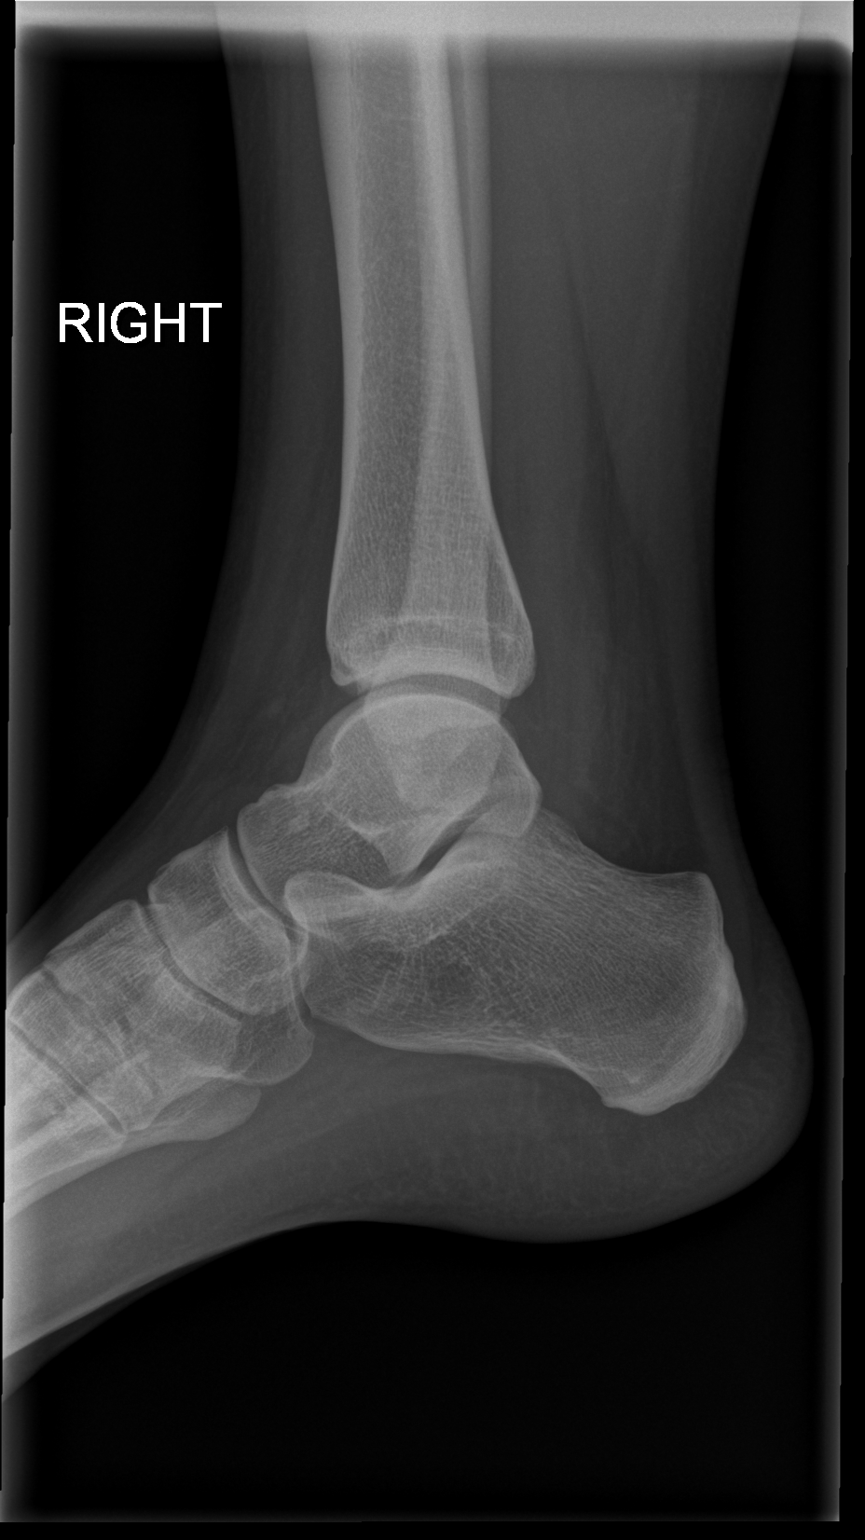

[3 of 3 positions shown; findings below may reference images not displayed]

FINDINGS: There is no evidence of fracture or dislocation. Prominent soft
tissue swelling is seen adjacent to the lateral malleolus.
IMPRESSION: Prominent lateral soft tissue swelling. No acute fracture
identified.

## 2014-12-25 ENCOUNTER — Encounter (HOSPITAL_COMMUNITY): Payer: Self-pay | Admitting: Psychiatry

## 2014-12-25 ENCOUNTER — Ambulatory Visit (INDEPENDENT_AMBULATORY_CARE_PROVIDER_SITE_OTHER): Payer: 59 | Admitting: Psychiatry

## 2014-12-25 VITALS — BP 106/72 | HR 81 | Ht 62.0 in | Wt 184.0 lb

## 2014-12-25 DIAGNOSIS — F331 Major depressive disorder, recurrent, moderate: Secondary | ICD-10-CM | POA: Diagnosis not present

## 2014-12-25 MED ORDER — LAMOTRIGINE 200 MG PO TABS: 200.0000 mg | ORAL_TABLET | Freq: Every day | ORAL | Status: DC

## 2014-12-25 MED ORDER — TRAZODONE HCL 50 MG PO TABS: ORAL_TABLET | ORAL | Status: DC

## 2014-12-25 MED ORDER — ALPRAZOLAM 0.5 MG PO TABS: 0.5000 mg | ORAL_TABLET | ORAL | Status: DC | PRN

## 2014-12-25 MED ORDER — SERTRALINE HCL 100 MG PO TABS: ORAL_TABLET | ORAL | Status: DC

## 2014-12-25 NOTE — Progress Notes (Signed)
[facility]  12/25/2014 3:02 PM Mary HaymakerMeghan C Rutten  MRN:  [id number] Subjective: not happy Principal Problem: depression Diagnosis:  Major Depression, recurrent episode while At this time the patient is not feeling well. She says she feels depressed daily and it's been this way for the last 3  Months. She notes her mother is also  Depressed. She makes his statements that her mother and father should be getting divorced. Her mother is making plans to leave. The patient is aware of this. Patient and the mother seemed to have an alliance. Her mother is very depressed and apparently takes multiple antidepressants.the patient takes her medicines regularly. She describes she's having trouble sleeping. Her appetite is normal but her energy level is low. She still able to think and concentrate and she denies suicidal thinking. She denies a feeling of worthlessness but acknowledges that she just feels uncomfortable and unusual around people. She fears she has Aspergers. She says she has 2 friends who have the same condition and she gets along well with them.the patient denies the use of drugs she denies the use of alcohol. Presently she has no significant relationships other than some Facebook friends. She might have some other friends but they're very distant and not consistent. The patient misses her psychotherapist  Joni. The patient does enjoy cooking while she TV and playing video games. She clearly appears quite isolated. She does not have psychotic symptoms. In close evaluation the patient denies any symptoms resembling mania. She was placed on Lamictal by previous psychiatrist for mood instability. That mainly meant that she was crying easily. She claims Lamictal is been helpful and efforts to take her off of it has led to a decline. Patient Active Problem List   Diagnosis Date Noted  . Major depressive disorder, recurrent episode, severe, without mention of psychotic behavior [F33.2] 02/14/2013  .  Overweight(278.02) [E66.3] 01/26/2013   Total Time spent with patient: 30 minutes  Past Psychiatric History:   Past Medical History:  Past Medical History  Diagnosis Date  . Depression   . Anxiety   . ADHD (attention deficit hyperactivity disorder)   . Obesity   . GERD (gastroesophageal reflux disease)   . Environmental allergies   . Myopia of both eyes    History reviewed. No pertinent past surgical history. Family History:  Family History  Problem Relation Age of Onset  . Bipolar disorder Father   . Diabetes Father   . Hyperlipidemia Father   . Bipolar disorder Brother   . Depression Mother   . Hyperlipidemia Mother   . Bipolar disorder Paternal Aunt   . Diabetes Maternal Grandmother   . Heart disease Maternal Grandmother   . Hyperlipidemia Maternal Grandmother   . Hypertension Maternal Grandmother   . Heart disease Maternal Grandfather   . Hyperlipidemia Maternal Grandfather   . Heart disease Paternal Grandmother   . Hypertension Paternal Grandmother   . Cancer Paternal Grandfather   . Diabetes Paternal Grandfather    Family Psychiatric  History:  Social History:  History  Alcohol Use  . 0.0 oz/week  . 0 Standard drinks or equivalent per week    Comment: Occasionaly     History  Drug Use No    Social History   Social History  . Marital Status: Single    Spouse Name: N/A  . Number of Children: N/A  . Years of Education: N/A   Occupational History  . Student    Social History Main Topics  . Smoking  status: Never Smoker   . Smokeless tobacco: None  . Alcohol Use: 0.0 oz/week    0 Standard drinks or equivalent per week     Comment: Occasionaly  . Drug Use: No  . Sexual Activity: No   Other Topics Concern  . None   Social History Narrative   07/05/12 AHW  [name] was born in [place], [place] and she currently lives with her mother, father, and her younger brother. She has an older brother who lives in [place]. [place]. She graduated high school.  She is currently attending [employer] studying biology. She plans to return to [employer] to complete a degree in marine biology.  She is currently unemployed. 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  She denies any history of legal problems. She reports that her social support system consists of her therapist. She enjoys playing video games, but watching, cocaine, sewing, white in nature documentaries, and hiking. 07/05/12 AHW      Regular exercise: no   Caffeine use: no   Additional Social History:                         Sleep: Poor  Appetite:  Good  Current Medications: Current Outpatient Prescriptions  Medication Sig Dispense Refill  . acetaminophen (TYLENOL) 500 MG tablet Take 500 mg by mouth every 6 (six) hours as needed for pain.    Marland Kitchen ALPRAZolam (XANAX) 0.5 MG tablet Take 1 tablet (0.5 mg total) by mouth as needed for sleep. 30 tablet 5  . ibuprofen (ADVIL,MOTRIN) 800 MG tablet Take 1 tablet (800 mg total) by mouth 3 (three) times daily. 21 tablet 0  . lamoTRIgine (LAMICTAL) 200 MG tablet Take 1 tablet (200 mg total) by mouth daily after supper. 30 tablet 5  . meclizine (ANTIVERT) 50 MG tablet Take 0.5 tablets (25 mg total) by mouth 3 (three) times daily as needed for dizziness or nausea. 30 tablet 0  . Naproxen Sodium (ALEVE PO) Take 2 tablets by mouth daily as needed (for pain).    . norgestimate-ethinyl estradiol (SPRINTEC 28) 0.25-35 MG-MCG tablet Take 1 tablet by mouth daily.    . sertraline (ZOLOFT) 100 MG tablet 1 and a half qam 45 tablet 5  . traZODone (DESYREL) 50 MG tablet 2  qhs 60 tablet 5   No current facility-administered medications for this visit.    Lab Results: No results found for this or any previous visit (from the past 48 hour(s)).  Physical Findings: AIMS:  , ,  ,  ,    CIWA:    COWS:     Musculoskeletal: Strength & Muscle Tone: within normal limits Gait & Station: normal Patient leans: Right  Psychiatric Specialty Exam: ROS  Blood  pressure 106/72, pulse 81, height  (1.575 m), weight 184 lb (83.462 kg), last menstrual period 11/22/2014.Body mass index is 33.65 kg/(m^2).  General Appearance: Casual  Eye Contact::  Good  Speech:  Normal Rate  Volume:  Normal  Mood:sad  Affect:  Blunt  Thought Process:  Coherent  Orientation:  Full (Time, Place, and Person)  Thought Content:  WDL  Suicidal Thoughts:  No  Homicidal Thoughts:  No  Memory:  NA  Judgement:  Good  Insight:  Fair  Psychomotor Activity:  Decreased  Concentration:  Good  Recall:  Good  Fund of Knowledge:Good  Language: Fair  Akathisia:  No  Handed:  Right  AIMS (if indicated):     Assets:  Desire for  Improvement  ADL's:  Intact  Cognition: WNL  Sleep:      Treatment Plan Summary: Medication managementat this time the patient clearly appears more depressed than usual. Will go ahead and increase her Zoloft from 100 mg up to 150 mg with plans to consider raising it to 200. The time being she'll continue on 200 mg of Lamictal. At this time we'll attempt to get her into therapy with Angeline Slim. We'll also make attempts to get her some psychological testing. Patient is clearly isolated does have somewhat of an unusual personality. In someway she is eccentric and avoidant. This time she denies any medical problems. This patient to return to see me in 7 weeks.  Bradan Congrove IRVING 12/25/2014, 3:02 PM

## 2014-12-31 ENCOUNTER — Telehealth (HOSPITAL_COMMUNITY): Payer: Self-pay

## 2014-12-31 NOTE — Telephone Encounter (Signed)
Telephone call with Barth Kirkseri, receptionist at Dr. Mariane MastersAltabet's office with [facility] to assist patient with referral to see Dr.Altebet for psychological testing per Dr.Plovsky request from 12/25/14.  Collateral stated she would call patient to arrange testing and call us back if any problems.  Consent for Dr. Reggy EyeAltabet to be scanned into EPIC from patient/

## 2015-01-28 MED FILL — traZODone HCL 50 MG TABS: 50 | 30 days supply | Qty: 60 | Fill #0

## 2015-02-03 MED FILL — SERTRALINE HCL 100 MG TAB: 100 | 30 days supply | Qty: 45 | Fill #1

## 2015-02-03 MED FILL — lamoTRIgine 200 MG TABS: 200 | 30 days supply | Qty: 30 | Fill #2

## 2015-02-12 ENCOUNTER — Ambulatory Visit (INDEPENDENT_AMBULATORY_CARE_PROVIDER_SITE_OTHER): Payer: 59 | Admitting: Psychiatry

## 2015-02-12 DIAGNOSIS — F330 Major depressive disorder, recurrent, mild: Secondary | ICD-10-CM

## 2015-02-12 MED ORDER — SERTRALINE HCL 100 MG PO TABS: ORAL_TABLET | ORAL | Status: DC

## 2015-02-12 MED ORDER — LAMOTRIGINE 200 MG PO TABS: 200.0000 mg | ORAL_TABLET | Freq: Every day | ORAL | Status: DC

## 2015-02-12 MED ORDER — TEMAZEPAM 15 MG PO CAPS: ORAL_CAPSULE | ORAL | Status: DC

## 2015-02-12 NOTE — Progress Notes (Signed)
[facility] MD Progress Note  02/12/2015 2:29 PM [name]  MRN:  [id number] Subjective:  Still depressed and having panic attacks Principal Problem: Major depression, recurrent episode moderate Diagnosis:  Major depression, recurrent episode moderate Today the patient seems somewhat worse. She's having it sounds like panic attacks every other day and occur out of the blue. They're intensely last 5-10 minutes and are associated with palpitations, sweating and trembling. She also has some mild chest discomfort during the attack. Patient also describes some shortness of breath all occurring during the attack and all go away when the attack is over. Patient says she's having great difficulty sleeping. She sleeps only a small amount of hours and then feels tired all day. I suspect that she does to the trazodone is not only not helping her sleep but it might be affecting her the next day. The patient's appetite is up and down she's not losing weight. She is a low amount of energy. She is concentrating fairly well is to go to [employer]. Unfortunately she's missed a few classes because of the attacks. Patient shows no evidence of psychosis. She drinks no alcohol uses no drugs. Unfortunately the psychologist we called to get her testing is not connected with the patient. The patient agreed to allow me to call Dr. and try again to get testing. Also the patient never got to get set up with a therapist. [name] attempt that again. The patient says she is crying less. On the other hand she says she still very depressed. She describes is not being persistently coming and going. The patient takes Xanax once in a while that she is very inconsistent. I suggested to her that she should take Klonopin but she is very resistant to it. She said her father had a bad reaction to it. We both agreed the trazodone is not working.  Patient Active Problem List   Diagnosis Date Noted  . Major depressive disorder, recurrent episode,  severe, without mention of psychotic behavior [F33.2] 02/14/2013  . Overweight(278.02) [E66.3] 01/26/2013   Total Time spent with patient: 30 minutes  Past Psychiatric History:   Past Medical History:  Past Medical History  Diagnosis Date  . Depression   . Anxiety   . ADHD (attention deficit hyperactivity disorder)   . Obesity   . GERD (gastroesophageal reflux disease)   . Environmental allergies   . Myopia of both eyes    No past surgical history on file. Family History:  Family History  Problem Relation Age of Onset  . Bipolar disorder Father   . Diabetes Father   . Hyperlipidemia Father   . Bipolar disorder Brother   . Depression Mother   . Hyperlipidemia Mother   . Bipolar disorder Paternal Aunt   . Diabetes Maternal Grandmother   . Heart disease Maternal Grandmother   . Hyperlipidemia Maternal Grandmother   . Hypertension Maternal Grandmother   . Heart disease Maternal Grandfather   . Hyperlipidemia Maternal Grandfather   . Heart disease Paternal Grandmother   . Hypertension Paternal Grandmother   . Cancer Paternal Grandfather   . Diabetes Paternal Grandfather    Family Psychiatric  History:  Social History:  History  Alcohol Use  . 0.0 oz/week  . 0 Standard drinks or equivalent per week    Comment: Occasionaly     History  Drug Use No    Social History   Social History  . Marital Status: Single    Spouse Name: N/A  . Number  of Children: N/A  . Years of Education: N/A   Occupational History  . Student    Social History Main Topics  . Smoking status: Never Smoker   . Smokeless tobacco: Not on file  . Alcohol Use: 0.0 oz/week    0 Standard drinks or equivalent per week     Comment: Occasionaly  . Drug Use: No  . Sexual Activity: No   Other Topics Concern  . Not on file   Social History Narrative   07/05/12 AHW  [name] was born in [place], [place] and she currently lives with her mother, father, and her younger brother. She has an  older brother who lives in [place]. [place]. She graduated high school. She is currently attending [employer] studying biology. She plans to return to [employer] to complete a degree in marine biology.  She is currently unemployed. 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  She denies any history of legal problems. She reports that her social support system consists of her therapist. She enjoys playing video games, but watching, cocaine, sewing, white in nature documentaries, and hiking. 07/05/12 AHW      Regular exercise: no   Caffeine use: no   Additional Social History:                         Sleep: Fair  Appetite:  Fair  Current Medications: Current Outpatient Prescriptions  Medication Sig Dispense Refill  . acetaminophen (TYLENOL) 500 MG tablet Take 500 mg by mouth every 6 (six) hours as needed for pain.    Marland Kitchen ALPRAZolam (XANAX) 0.5 MG tablet Take 1 tablet (0.5 mg total) by mouth as needed for sleep. 30 tablet 5  . ibuprofen (ADVIL,MOTRIN) 800 MG tablet Take 1 tablet (800 mg total) by mouth 3 (three) times daily. 21 tablet 0  . lamoTRIgine (LAMICTAL) 200 MG tablet Take 1 tablet (200 mg total) by mouth daily after supper. 30 tablet 5  . meclizine (ANTIVERT) 50 MG tablet Take 0.5 tablets (25 mg total) by mouth 3 (three) times daily as needed for dizziness or nausea. 30 tablet 0  . Naproxen Sodium (ALEVE PO) Take 2 tablets by mouth daily as needed (for pain).    . norgestimate-ethinyl estradiol (SPRINTEC 28) 0.25-35 MG-MCG tablet Take 1 tablet by mouth daily.    . sertraline (ZOLOFT) 100 MG tablet 2 qam 60 tablet 5  . temazepam (RESTORIL) 15 MG capsule 1  qhs  May increase to 2  qhs 60 capsule 3  . traZODone (DESYREL) 50 MG tablet 2  qhs 60 tablet 5   No current facility-administered medications for this visit.    Lab Results: No results found for this or any previous visit (from the past 48 hour(s)).  Physical Findings: AIMS:  , ,  ,  ,    CIWA:    COWS:      Musculoskeletal: Strength & Muscle Tone: within normal limits Gait & Station: normal Patient leans: Right and N/A  Psychiatric Specialty Exam: ROS  There were no vitals taken for this visit.There is no weight on file to calculate BMI.  General Appearance: Casual  Eye Contact::  Fair  Speech:  Clear and Coherent  Volume:  Normal  Mood:  Euthymic  Affect:  Blunt  Thought Process:  Coherent  Orientation:  Full (Time, Place, and Person)  Thought Content:  WDL  Suicidal Thoughts: No   Homicidal Thoughts:  No  Memory:  Negative  Judgement:  Fair  Insight:  Fair  Psychomotor Activity:  NA and Normal  Concentration:  Good  Recall:  [employer] of Knowledge:Fair  Language: Fair  Akathisia:  No  Handed:  Right  AIMS (if indicated):     Assets:  Communication Skills  ADL's:  Intact  Cognition: WNL  Sleep:      Treatment Plan Summary: At this time the patient will continue taking Lamictal because she says that was started to keep her mood is even. She says without it she is very uncomfortable. I do not think she really has mania but she probably does have some mood instability. Zoloft at 150 mg which was the dose we increased it to does not really help very much. Will go ahead and maximize it to 200 mg. We'll be using both for panic attacks as well as for depression. Fortunately the patient does not have associated symptoms of avoidance, anticipatory anxiety and fears that she's going to go crazy removes her mind. In essence she has panic attacks but not really panic disorder. The panic attacks occur every day or every other day. She suspects they're worsened because of school. The patient is resistant to taking Klonopin which is okay. We'll we will do is discontinue her trazodone and begin her on Restoril 15 mg 1 or 2 at night. Important also will attempt to get her connected again with [facility] as a therapist. He also make contact with Dr.Alterbet [phone] try to get her some psych testing  both for her therapy and for that condition of possible spectrum disorder. This patient is not suicidal. She denies chest pain or shortness of breath 0.1 going basis. The patient will continue all of her medications but increase her Zoloft and begin on Restoril. She'll return to see me in approximately 2 months.    Lucas Mallow, MD 02/12/2015, 2:29 PM

## 2015-02-13 MED FILL — ALPRAZolam 0.5 MG TABS: 0.5 | 30 days supply | Qty: 30 | Fill #0

## 2015-02-13 MED FILL — TEMAZEPAM 15 MG CAPSULE: 15 | 30 days supply | Qty: 60 | Fill #0

## 2015-02-18 MED FILL — SERTRALINE HCL 100 MG TAB: 100 | 30 days supply | Qty: 60 | Fill #0

## 2015-03-03 MED FILL — traZODone HCL 50 MG TABS: 50 | 30 days supply | Qty: 60 | Fill #1

## 2015-03-03 MED FILL — lamoTRIgine 200 MG TABS: 200 | 30 days supply | Qty: 30 | Fill #3

## 2015-03-04 MED FILL — MONO-LINYAH 28 TABLET: 0.25-35 | 21 days supply | Qty: 28 | Fill #0

## 2015-03-10 DIAGNOSIS — Z01419 Encounter for gynecological examination (general) (routine) without abnormal findings: Secondary | ICD-10-CM | POA: Diagnosis not present

## 2015-03-10 DIAGNOSIS — N898 Other specified noninflammatory disorders of vagina: Secondary | ICD-10-CM | POA: Diagnosis not present

## 2015-03-24 MED FILL — MONO-LINYAH 28 TABLET: 0.25-35 | 84 days supply | Qty: 112 | Fill #0

## 2015-03-25 DIAGNOSIS — N920 Excessive and frequent menstruation with regular cycle: Secondary | ICD-10-CM | POA: Diagnosis not present

## 2015-03-25 DIAGNOSIS — E282 Polycystic ovarian syndrome: Secondary | ICD-10-CM | POA: Diagnosis not present

## 2015-03-31 MED FILL — SERTRALINE HCL 100 MG TAB: 100 | 30 days supply | Qty: 60 | Fill #1

## 2015-03-31 MED FILL — traZODone HCL 50 MG TABS: 50 | 30 days supply | Qty: 60 | Fill #2

## 2015-03-31 MED FILL — lamoTRIgine 200 MG TABS: 200 | 30 days supply | Qty: 30 | Fill #4

## 2015-04-16 ENCOUNTER — Ambulatory Visit (INDEPENDENT_AMBULATORY_CARE_PROVIDER_SITE_OTHER): Payer: 59 | Admitting: Psychiatry

## 2015-04-16 VITALS — BP 128/76 | HR 72 | Ht 62.0 in | Wt 186.6 lb

## 2015-04-16 DIAGNOSIS — F331 Major depressive disorder, recurrent, moderate: Secondary | ICD-10-CM | POA: Diagnosis not present

## 2015-04-16 DIAGNOSIS — F339 Major depressive disorder, recurrent, unspecified: Secondary | ICD-10-CM

## 2015-04-16 MED ORDER — TRAZODONE HCL 100 MG PO TABS: ORAL_TABLET | ORAL | Status: DC

## 2015-04-16 MED ORDER — SERTRALINE HCL 100 MG PO TABS: ORAL_TABLET | ORAL | Status: DC

## 2015-04-16 MED ORDER — LAMOTRIGINE 200 MG PO TABS: 200.0000 mg | ORAL_TABLET | Freq: Every day | ORAL | Status: DC

## 2015-04-16 NOTE — Progress Notes (Signed)
Patient ID: [name], female   DOB: 02/27/1990, 25 y.o.   MRN: [id number] [facility] Progress Note  04/16/2015 3:27 PM [name]  MRN:  [id number] Subjective: Better but very tired Principal Problem: Major depression, recurrent episode moderate Diagnosis:  Major depression, recurrent episode moderate Today the patient reports that her mood is better. She's no longer persistently depressed but rather has option pounds. Importantly also is the fact that she's having much less panic attacks. The patient is being evaluated for polycystic ovarian syndrome by [name]. The patient says she is not sleeping well. It turns out the Restoril daily. She's gone back to trazodone but only takes 100 mg. The patient is eating fairly well. The patient never uses alcohol or drugs. The patient is active doing things. She continues in school. Her grades are average. She wants to get a colonary degree. Her semester be over in a few months. The patient says her parents are doing okay. She's never lived anywhere else than with her parents. She described her father's being a hoerder. Both her mother and father according to her unhappy jobs. Unfortunately the patient has not started in therapy. She had her mother get massages for her pain and spending money for this patient to get therapy. I not clear if she had psych testing that we talked about the last visit. We'll bring this up at her next visit. For the most part the patient is overall doing better. Her mood is more even. She continues taking Lamictal 200 mg which is actually low dose given the fact that she is on birth control pills. At this time I'm resistant to making any changes. This patient is very resistant taking benzodiazepines which is okay with me. This patient is not suicidal. She denies chest pain or shortness of breath. Patient Active Problem List   Diagnosis Date Noted  . Major depressive disorder, recurrent episode, severe, without mention of  psychotic behavior [F33.2] 02/14/2013  . Overweight(278.02) [E66.3] 01/26/2013   Total Time spent with patient: 30 minutes  Past Psychiatric History:   Past Medical History:  Past Medical History  Diagnosis Date  . Depression   . Anxiety   . ADHD (attention deficit hyperactivity disorder)   . Obesity   . GERD (gastroesophageal reflux disease)   . Environmental allergies   . Myopia of both eyes    No past surgical history on file. Family History:  Family History  Problem Relation Age of Onset  . Bipolar disorder Father   . Diabetes Father   . Hyperlipidemia Father   . Bipolar disorder Brother   . Depression Mother   . Hyperlipidemia Mother   . Bipolar disorder Paternal Aunt   . Diabetes Maternal Grandmother   . Heart disease Maternal Grandmother   . Hyperlipidemia Maternal Grandmother   . Hypertension Maternal Grandmother   . Heart disease Maternal Grandfather   . Hyperlipidemia Maternal Grandfather   . Heart disease Paternal Grandmother   . Hypertension Paternal Grandmother   . Cancer Paternal Grandfather   . Diabetes Paternal Grandfather    Family Psychiatric  History:  Social History:  History  Alcohol Use  . 0.0 oz/week  . 0 Standard drinks or equivalent per week    Comment: Occasionaly     History  Drug Use No    Social History   Social History  . Marital Status: Single    Spouse Name: N/A  . Number of Children: N/A  . Years of Education:  N/A   Occupational History  . Student    Social History Main Topics  . Smoking status: Never Smoker   . Smokeless tobacco: Not on file  . Alcohol Use: 0.0 oz/week    0 Standard drinks or equivalent per week     Comment: Occasionaly  . Drug Use: No  . Sexual Activity: No   Other Topics Concern  . Not on file   Social History Narrative   07/05/12 AHW  [name] was born in [place], [place] and she currently lives with her mother, father, and her younger brother. She has an older brother who lives in  [place]. [place]. She graduated high school. She is currently attending [employer] studying biology. She plans to return to [employer] to complete a degree in marine biology.  She is currently unemployed. 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  She denies any history of legal problems. She reports that her social support system consists of her therapist. She enjoys playing video games, but watching, cocaine, sewing, white in nature documentaries, and hiking. 07/05/12 AHW      Regular exercise: no   Caffeine use: no   Additional Social History:                         Sleep: Fair  Appetite:  Fair  Current Medications: Current Outpatient Prescriptions  Medication Sig Dispense Refill  . acetaminophen (TYLENOL) 500 MG tablet Take 500 mg by mouth every 6 (six) hours as needed for pain.    Marland Kitchen ALPRAZolam (XANAX) 0.5 MG tablet Take 1 tablet (0.5 mg total) by mouth as needed for sleep. 30 tablet 5  . ibuprofen (ADVIL,MOTRIN) 800 MG tablet Take 1 tablet (800 mg total) by mouth 3 (three) times daily. 21 tablet 0  . lamoTRIgine (LAMICTAL) 200 MG tablet Take 1 tablet (200 mg total) by mouth daily after supper. 30 tablet 5  . meclizine (ANTIVERT) 50 MG tablet Take 0.5 tablets (25 mg total) by mouth 3 (three) times daily as needed for dizziness or nausea. 30 tablet 0  . Naproxen Sodium (ALEVE PO) Take 2 tablets by mouth daily as needed (for pain).    . norgestimate-ethinyl estradiol (SPRINTEC 28) 0.25-35 MG-MCG tablet Take 1 tablet by mouth daily.    . sertraline (ZOLOFT) 100 MG tablet 2 qam 60 tablet 5  . temazepam (RESTORIL) 15 MG capsule 1  qhs  May increase to 2  qhs 60 capsule 3  . traZODone (DESYREL) 100 MG tablet 2  qhs 60 tablet 5   No current facility-administered medications for this visit.    Lab Results: No results found for this or any previous visit (from the past 48 hour(s)).  Physical Findings: AIMS:  , ,  ,  ,    CIWA:    COWS:     Musculoskeletal: Strength & Muscle Tone: within  normal limits Gait & Station: normal Patient leans: Right and N/A  Psychiatric Specialty Exam: ROS  Blood pressure 128/76, pulse 72, height  (1.575 m), weight 186 lb 9.6 oz (84.641 kg).Body mass index is 34.12 kg/(m^2).  General Appearance: Casual  Eye Contact::  Fair  Speech:  Clear and Coherent  Volume:  Normal  Mood:  Euthymic  Affect:  Blunt  Thought Process:  Coherent  Orientation:  Full (Time, Place, and Person)  Thought Content:  WDL  Suicidal Thoughts: No   Homicidal Thoughts:  No  Memory:  Negative  Judgement:  Fair  Insight:  Fair  Psychomotor Activity:  NA and Normal  Concentration:  Good  Recall:  [employer] of Knowledge:Fair  Language: Fair  Akathisia:  No  Handed:  Right  AIMS (if indicated):     Assets:  Communication Skills  ADL's:  Intact  Cognition: WNL  Sleep:      Treatment Plan Summary: At this time the patient will continue taking the maximum dose of Zoloft. He seems to be helpful for her problem of depression and also panic attacks. Her first problem is that of clinical depression and Zoloft does seem to help. I suspect Lamictal has a benefit as well. The patient will not answer into therapy at this time. Her second problem is panic attacks. He the patient will continue taking Zoloft which does seem to help this as well. Her biggest she will also asleep. At this time we'll go ahead and increase her trazodone to the dose of 200 mg at night. At this time the patient will continue her evaluation for polycystic ovarian syndrome which is treatment could help her overall terms are removed. This patient is not suicidal. She is not psychotic. She is functioning fairly well. Lucas Mallow, MD 04/16/2015, 3:27 PM

## 2015-04-25 MED FILL — lamoTRIgine 200 MG TABS: 200 | 30 days supply | Qty: 30 | Fill #5

## 2015-04-25 MED FILL — SERTRALINE HCL 100 MG TAB: 100 | 30 days supply | Qty: 60 | Fill #2

## 2015-04-25 MED FILL — ALPRAZolam 0.5 MG TABS: 0.5 | 30 days supply | Qty: 30 | Fill #1

## 2015-04-28 MED FILL — traZODone HCL 100 MG TABS: 100 | 30 days supply | Qty: 60 | Fill #0

## 2015-05-27 MED FILL — MONO-LINYAH 28 TABLET: 0.25-35 | 84 days supply | Qty: 112 | Fill #1 | Status: TO

## 2015-05-27 MED FILL — traZODone HCL 100 MG TABS: 100 | 30 days supply | Qty: 60 | Fill #1

## 2015-05-27 MED FILL — lamoTRIgine 200 MG TABS: 200 | 30 days supply | Qty: 30 | Fill #0

## 2015-05-27 MED FILL — SERTRALINE HCL 100 MG TAB: 100 | 30 days supply | Qty: 60 | Fill #3

## 2015-06-23 MED FILL — traZODone HCL 100 MG TABS: 100 | 30 days supply | Qty: 60 | Fill #2 | Status: TO

## 2015-06-23 MED FILL — lamoTRIgine 200 MG TABS: 200 | 30 days supply | Qty: 30 | Fill #1 | Status: TO

## 2015-06-23 MED FILL — SERTRALINE HCL 100 MG TAB: 100 | 30 days supply | Qty: 60 | Fill #4 | Status: TO

## 2015-07-28 MED FILL — SERTRALINE HCL 100 MG TAB: 100 | 30 days supply | Qty: 60 | Fill #0

## 2015-07-28 MED FILL — traZODone HCL 100 MG TABS: 100 | 30 days supply | Qty: 60 | Fill #0

## 2015-07-28 MED FILL — lamoTRIgine 200 MG TABS: 200 | 30 days supply | Qty: 30 | Fill #0

## 2015-08-13 ENCOUNTER — Ambulatory Visit (HOSPITAL_COMMUNITY): Payer: 59 | Admitting: Psychiatry

## 2015-08-13 VITALS — BP 122/74 | HR 76 | Ht 62.0 in | Wt 184.2 lb

## 2015-08-13 DIAGNOSIS — F331 Major depressive disorder, recurrent, moderate: Secondary | ICD-10-CM

## 2015-08-13 NOTE — Progress Notes (Signed)
Patient ID: [name], female   DOB: [birth year], 25 y.o.   MRN: [id number] [facility] MD Progress Note  08/13/2015 4:19 PM [name]  MRN:  [id number] Subjective:  Principal Problem: Major depression, recurrent episode moderate Diagnosis:  Major depression, recurrent episode moderate reath. Patient Active Problem List   Diagnosis Date Noted  . Major depressive disorder, recurrent episode, severe, without mention of psychotic behavior [F33.2] 02/14/2013  . Overweight(278.02) [E66.3] 01/26/2013  At this time the patient is doing well. She is a little tired. She's had trouble sleeping. But overall her mood is well. She is active and getting ready to go to college to take 2 courses. This excrescence. She seems to be doing better. She's less depressed or anxious. She's taking her medicines as prescribed. She is sleeping and eating well. She is not in therapy at this time. Her parents are doing well. Total Time spent with patient: 30 minutes  Past Psychiatric History:   Past Medical History:  Past Medical History:  Diagnosis Date  . ADHD (attention deficit hyperactivity disorder)   . Anxiety   . Depression   . Environmental allergies   . GERD (gastroesophageal reflux disease)   . Myopia of both eyes   . Obesity    No past surgical history on file. Family History:  Family History  Problem Relation Age of Onset  . Bipolar disorder Father   . Diabetes Father   . Hyperlipidemia Father   . Bipolar disorder Brother   . Depression Mother   . Hyperlipidemia Mother   . Bipolar disorder Paternal Aunt   . Diabetes Maternal Grandmother   . Heart disease Maternal Grandmother   . Hyperlipidemia Maternal Grandmother   . Hypertension Maternal Grandmother   . Heart disease Maternal Grandfather   . Hyperlipidemia Maternal Grandfather   . Heart disease Paternal Grandmother   . Hypertension Paternal Grandmother   . Cancer Paternal Grandfather   . Diabetes Paternal Grandfather    Family  Psychiatric  History:  Social History:  History  Alcohol Use  . 0.0 oz/week    Comment: Occasionaly     History  Drug Use No    Social History   Social History  . Marital status: Single    Spouse name: N/A  . Number of children: N/A  . Years of education: N/A   Occupational History  . Student    Social History Main Topics  . Smoking status: Never Smoker  . Smokeless tobacco: Not on file  . Alcohol use 0.0 oz/week     Comment: Occasionaly  . Drug use: No  . Sexual activity: No   Other Topics Concern  . Not on file   Social History Narrative   07/05/12 AHW  Miata was born in [place], [place] and she currently lives with her mother, father, and her younger brother. She has an older brother who lives in [place]. [place]. She graduated high school. She is currently attending [employer] studying biology. She plans to return to [employer] to complete a degree in marine biology.  She is currently unemployed. 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  She denies any history of legal problems. She reports that her social support system consists of her therapist. She enjoys playing video games, but watching, cocaine, sewing, white in nature documentaries, and hiking. 07/05/12 AHW      Regular exercise: no   Caffeine use: no   Additional Social History:  Sleep: Fair  Appetite:  Fair  Current Medications: Current Outpatient Prescriptions  Medication Sig Dispense Refill  . acetaminophen (TYLENOL) 500 MG tablet Take 500 mg by mouth every 6 (six) hours as needed for pain.    Marland Kitchen ALPRAZolam (XANAX) 0.5 MG tablet Take 1 tablet (0.5 mg total) by mouth as needed for sleep. 30 tablet 5  . ibuprofen (ADVIL,MOTRIN) 800 MG tablet Take 1 tablet (800 mg total) by mouth 3 (three) times daily. 21 tablet 0  . lamoTRIgine (LAMICTAL) 200 MG tablet Take 1 tablet (200 mg total) by mouth daily after supper. 30 tablet 5  . meclizine (ANTIVERT) 50 MG tablet Take 0.5 tablets  (25 mg total) by mouth 3 (three) times daily as needed for dizziness or nausea. 30 tablet 0  . Naproxen Sodium (ALEVE PO) Take 2 tablets by mouth daily as needed (for pain).    . norgestimate-ethinyl estradiol (SPRINTEC 28) 0.25-35 MG-MCG tablet Take 1 tablet by mouth daily.    . sertraline (ZOLOFT) 100 MG tablet 2 qam 60 tablet 5  . temazepam (RESTORIL) 15 MG capsule 1  qhs  May increase to 2  qhs 60 capsule 3  . traZODone (DESYREL) 100 MG tablet 2  qhs 60 tablet 5   No current facility-administered medications for this visit.     Lab Results: No results found for this or any previous visit (from the past 48 hour(s)).  Physical Findings: AIMS:  , ,  ,  ,    CIWA:    COWS:     Musculoskeletal: Strength & Muscle Tone: within normal limits Gait & Station: normal Patient leans: Right and N/A  Psychiatric Specialty Exam: ROS  Blood pressure 122/74, pulse 76, height 5\' 2"  (1.575 m), weight 184 lb 3.2 oz (83.6 kg).Body mass index is 33.69 kg/m.  General Appearance: Casual  Eye Contact::  Fair  Speech:  Clear and Coherent  Volume:  Normal  Mood:  Euthymic  Affect:  Blunt  Thought Process:  Coherent  Orientation:  Full (Time, Place, and Person)  Thought Content:  WDL  Suicidal Thoughts: No   Homicidal Thoughts:  No  Memory:  Negative  Judgement:  Fair  Insight:  Fair  Psychomotor Activity:  NA and Normal  Concentration:  Good  Recall:  Fair  Fund of Knowledge:Fair  Language: Fair  Akathisia:  No  Handed:  Right  AIMS (if indicated):     Assets:  Communication Skills  ADL's:  Intact  Cognition: WNL  Sleep:      Treatment Plan Summary: 08/13/2015, 4:19 PM Patient ID: [name], female   DOB: 07-10-1990, 25 y.o.   MRN: [id number] At this time the patient is doing well and will continue taking all the medications prescribed. This includes maximum dose Zoloft and trazodone. The patient return to see me in 3 months.

## 2015-08-13 NOTE — Progress Notes (Deleted)
BH MD/PA/NP OP Progress Note  08/13/2015 2:16 PM [name]  MRN:  [id number]  Chief Complaint:  Subjective:  *** HPI: *** Visit Diagnosis: No diagnosis found.  Past Psychiatric History: ***  Past Medical History:  Past Medical History:  Diagnosis Date  . ADHD (attention deficit hyperactivity disorder)   . Anxiety   . Depression   . Environmental allergies   . GERD (gastroesophageal reflux disease)   . Myopia of both eyes   . Obesity    No past surgical history on file.  Family Psychiatric History: ***  Family History:  Family History  Problem Relation Age of Onset  . Bipolar disorder Father   . Diabetes Father   . Hyperlipidemia Father   . Bipolar disorder Brother   . Depression Mother   . Hyperlipidemia Mother   . Bipolar disorder Paternal Aunt   . Diabetes Maternal Grandmother   . Heart disease Maternal Grandmother   . Hyperlipidemia Maternal Grandmother   . Hypertension Maternal Grandmother   . Heart disease Maternal Grandfather   . Hyperlipidemia Maternal Grandfather   . Heart disease Paternal Grandmother   . Hypertension Paternal Grandmother   . Cancer Paternal Grandfather   . Diabetes Paternal Grandfather     Social History:  Social History   Social History  . Marital status: Single    Spouse name: N/A  . Number of children: N/A  . Years of education: N/A   Occupational History  . Student    Social History Main Topics  . Smoking status: Never Smoker  . Smokeless tobacco: Not on file  . Alcohol use 0.0 oz/week     Comment: Occasionaly  . Drug use: No  . Sexual activity: No   Other Topics Concern  . Not on file   Social History Narrative   07/05/12 AHW  [name] was born in [place], [place] and she currently lives with her mother, father, and her younger brother. She has an older brother who lives in [place]. [place]. She graduated high school. She is currently attending [employer] studying biology. She plans to return to [employer] to  complete a degree in marine biology.  She is currently unemployed. 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  She denies any history of legal problems. She reports that her social support system consists of her therapist. She enjoys playing video games, but watching, cocaine, sewing, white in nature documentaries, and hiking. 07/05/12 AHW      Regular exercise: no   Caffeine use: no    Allergies:  Allergies  Allergen Reactions  . Cymbalta [Duloxetine Hcl] Other (See Comments)    Hypotension  . Daytrana [Methylphenidate] Rash    Like a sunburn    Metabolic Disorder Labs: Lab Results  Component Value Date   HGBA1C 5.8 07/18/2012   No results found for: PROLACTIN No results found for: CHOL, TRIG, HDL, CHOLHDL, VLDL, LDLCALC   Current Medications: Current Outpatient Prescriptions  Medication Sig Dispense Refill  . acetaminophen (TYLENOL) 500 MG tablet Take 500 mg by mouth every 6 (six) hours as needed for pain.    Marland Kitchen ALPRAZolam (XANAX) 0.5 MG tablet Take 1 tablet (0.5 mg total) by mouth as needed for sleep. 30 tablet 5  . ibuprofen (ADVIL,MOTRIN) 800 MG tablet Take 1 tablet (800 mg total) by mouth 3 (three) times daily. 21 tablet 0  . lamoTRIgine (LAMICTAL) 200 MG tablet Take 1 tablet (200 mg total) by mouth daily after supper. 30 tablet 5  . meclizine (  ANTIVERT) 50 MG tablet Take 0.5 tablets (25 mg total) by mouth 3 (three) times daily as needed for dizziness or nausea. 30 tablet 0  . Naproxen Sodium (ALEVE PO) Take 2 tablets by mouth daily as needed (for pain).    . norgestimate-ethinyl estradiol (SPRINTEC 28) 0.25-35 MG-MCG tablet Take 1 tablet by mouth daily.    . sertraline (ZOLOFT) 100 MG tablet 2 qam 60 tablet 5  . temazepam (RESTORIL) 15 MG capsule 1  qhs  May increase to 2  qhs 60 capsule 3  . traZODone (DESYREL) 100 MG tablet 2  qhs 60 tablet 5   No current facility-administered medications for this visit.     Neurologic: Headache: {BHH YES OR NO:22294} Seizure: {BHH YES OR  NO:22294} Paresthesias: {BHH YES OR NO:22294}  Musculoskeletal: Strength & Muscle Tone: {desc; muscle tone:32375} Gait & Station: {PE GAIT ED ZOXW:96045} Patient leans: {Patient Leans:21022755}  Psychiatric Specialty Exam: ROS  Blood pressure 122/74, pulse 76, height  (1.575 m), weight 184 lb 3.2 oz (83.6 kg).Body mass index is 33.69 kg/m.  General Appearance: {Appearance:22683}  Eye Contact:  {BHH EYE CONTACT:22684}  Speech:  {Speech:22685}  Volume:  {Volume (PAA):22686}  Mood:  {BHH MOOD:22306}  Affect:  {Affect (PAA):22687}  Thought Process:  {Thought Process (PAA):22688}  Orientation:  {BHH ORIENTATION (PAA):22689}  Thought Content: {Thought Content:22690}   Suicidal Thoughts:  {ST/HT (PAA):22692}  Homicidal Thoughts:  {ST/HT (PAA):22692}  Memory:  {BHH MEMORY:22881}  Judgement:  {Judgement (PAA):22694}  Insight:  {Insight (PAA):22695}  Psychomotor Activity:  {Psychomotor (PAA):22696}  Concentration:  {Concentration:21399}  Recall:  {BHH GOOD/FAIR/POOR:22877}  Fund of Knowledge: {BHH GOOD/FAIR/POOR:22877}  Language: {BHH GOOD/FAIR/POOR:22877}  Akathisia:  {BHH YES OR NO:22294}  Handed:  {Handed:22697}  AIMS (if indicated):  ***  Assets:  {Assets (PAA):22698}  ADL's:  {BHH WUJ'W:11914}  Cognition: {chl bhh cognition:304700322}  Sleep:  ***     Treatment Plan Summary:{CHL AMB BH MD TX NWGN:[id number]}   Lucas Mallow, MD 08/13/2015, 2:16 PM

## 2015-08-25 MED FILL — traZODone HCL 100 MG TABS: 100 | 30 days supply | Qty: 60 | Fill #1

## 2015-08-25 MED FILL — lamoTRIgine 200 MG TABS: 200 | 30 days supply | Qty: 30 | Fill #1

## 2015-08-25 MED FILL — SERTRALINE HCL 100 MG TAB: 100 | 30 days supply | Qty: 60 | Fill #0

## 2015-08-25 MED FILL — MONO-LINYAH 28 TABLET: 0.25-35 | 84 days supply | Qty: 112 | Fill #0

## 2015-09-22 MED FILL — lamoTRIgine 200 MG TABS: 200 | 30 days supply | Qty: 30 | Fill #2

## 2015-09-22 MED FILL — SERTRALINE HCL 100 MG TAB: 100 | 30 days supply | Qty: 60 | Fill #1

## 2015-09-22 MED FILL — traZODone HCL 100 MG TABS: 100 | 30 days supply | Qty: 60 | Fill #2

## 2015-10-23 ENCOUNTER — Other Ambulatory Visit (HOSPITAL_COMMUNITY): Payer: Self-pay | Admitting: Psychiatry

## 2015-10-23 MED FILL — SERTRALINE HCL 100 MG TAB: 100 | 30 days supply | Qty: 60 | Fill #2

## 2015-10-23 MED FILL — lamoTRIgine 200 MG TABS: 200 | 30 days supply | Qty: 30 | Fill #3

## 2015-10-27 MED FILL — MONO-LINYAH 28 TABLET: 0.25-35 | 84 days supply | Qty: 112 | Fill #1

## 2015-10-28 ENCOUNTER — Other Ambulatory Visit (HOSPITAL_COMMUNITY): Payer: Self-pay

## 2015-10-28 MED ORDER — TRAZODONE HCL 100 MG PO TABS: ORAL_TABLET | ORAL | 0 refills | Status: DC

## 2015-10-28 MED FILL — traZODone HCL 100 MG TABS: 100 | 30 days supply | Qty: 60 | Fill #0

## 2015-10-28 NOTE — Progress Notes (Signed)
Refill request from patients pharmacy, patient is due for refill on her Trazodone and has a follow up on 11/1. I sent a one month order to the pharmacy per protocol.

## 2015-11-12 ENCOUNTER — Encounter (HOSPITAL_COMMUNITY): Payer: Self-pay | Admitting: Psychiatry

## 2015-11-12 ENCOUNTER — Ambulatory Visit (INDEPENDENT_AMBULATORY_CARE_PROVIDER_SITE_OTHER): Payer: 59 | Admitting: Psychiatry

## 2015-11-12 VITALS — BP 110/66 | HR 86 | Ht 62.0 in | Wt 186.8 lb

## 2015-11-12 DIAGNOSIS — Z8249 Family history of ischemic heart disease and other diseases of the circulatory system: Secondary | ICD-10-CM

## 2015-11-12 DIAGNOSIS — Z833 Family history of diabetes mellitus: Secondary | ICD-10-CM | POA: Diagnosis not present

## 2015-11-12 DIAGNOSIS — Z818 Family history of other mental and behavioral disorders: Secondary | ICD-10-CM | POA: Diagnosis not present

## 2015-11-12 DIAGNOSIS — Z809 Family history of malignant neoplasm, unspecified: Secondary | ICD-10-CM

## 2015-11-12 DIAGNOSIS — F321 Major depressive disorder, single episode, moderate: Secondary | ICD-10-CM

## 2015-11-12 DIAGNOSIS — Z8489 Family history of other specified conditions: Secondary | ICD-10-CM

## 2015-11-12 DIAGNOSIS — Z79899 Other long term (current) drug therapy: Secondary | ICD-10-CM | POA: Diagnosis not present

## 2015-11-12 MED ORDER — TRAZODONE HCL 100 MG PO TABS: ORAL_TABLET | ORAL | 0 refills | Status: DC

## 2015-11-12 MED ORDER — SERTRALINE HCL 100 MG PO TABS: ORAL_TABLET | ORAL | 6 refills | Status: DC

## 2015-11-12 MED ORDER — TEMAZEPAM 15 MG PO CAPS: ORAL_CAPSULE | ORAL | 5 refills | Status: DC

## 2015-11-12 MED ORDER — ALPRAZOLAM 0.5 MG PO TABS: 0.5000 mg | ORAL_TABLET | ORAL | 5 refills | Status: DC | PRN

## 2015-11-12 MED ORDER — LAMOTRIGINE 200 MG PO TABS: 200.0000 mg | ORAL_TABLET | Freq: Every day | ORAL | 5 refills | Status: DC

## 2015-11-12 MED FILL — SERTRALINE HCL 100 MG TAB: 100 | 30 days supply | Qty: 60 | Fill #0

## 2015-11-12 MED FILL — lamoTRIgine 200 MG TABS: 200 | 30 days supply | Qty: 30 | Fill #0

## 2015-11-12 MED FILL — traZODone HCL 100 MG TABS: 100 | 30 days supply | Qty: 90 | Fill #0

## 2015-11-12 MED FILL — ALPRAZolam 0.5 MG TABS: 0.5 | 30 days supply | Qty: 30 | Fill #0

## 2015-11-12 NOTE — Progress Notes (Signed)
Patient ID: [name], female   DOB: 12/27/1990, 25 y.o.   [id number] [facility]  11/12/2015 5:09 PM [name]  MRN:  [id number] Subjective:  Principal Problem: Major depression, recurrent episode moderate Diagnosis:  Major depression, recurrent episode moderate reath. At this time the patient is not doing well. She's felt depressed for the last one month. She does only change in her medicines now until after her tests. She says her sleep is disrupted and she's not eating well. She's waiting on a diagnosis for polycystic ovarian syndrome. The patient takes number psychotropic medications including the maximum dose of Zoloft. She is worried that that Zoloft may be too high and is causing more problems at work. Patient denies being psychotic. She denies suicidal thinking. She denies the use of alcohol or drugs. Patient still lives at home. She's taking 2 classes one is about classes that she's doing well and the other one clear she's not doing well. Patient wants her tested be over and will consider changing medicines probably changing her Zoloft to Wellbutrin. Total Time spent with patient: 30 minutes  Past Psychiatric History:   Past Medical History:  Past Medical History:  Diagnosis Date  . ADHD (attention deficit hyperactivity disorder)   . Anxiety   . Depression   . Environmental allergies   . GERD (gastroesophageal reflux disease)   . Myopia of both eyes   . Obesity    No past surgical history on file. Family History:  Family History  Problem Relation Age of Onset  . Bipolar disorder Father   . Diabetes Father   . Hyperlipidemia Father   . Bipolar disorder Brother   . Depression Mother   . Hyperlipidemia Mother   . Bipolar disorder Paternal Aunt   . Diabetes Maternal Grandmother   . Heart disease Maternal Grandmother   . Hyperlipidemia Maternal Grandmother   . Hypertension Maternal Grandmother   . Heart disease Maternal Grandfather   . Hyperlipidemia  Maternal Grandfather   . Heart disease Paternal Grandmother   . Hypertension Paternal Grandmother   . Cancer Paternal Grandfather   . Diabetes Paternal Grandfather    Family Psychiatric  History:  Social History:  History  Alcohol Use  . 0.0 oz/week    Comment: Occasionaly     History  Drug Use No    Social History   Social History  . Marital status: Single    Spouse name: N/A  . Number of children: N/A  . Years of education: N/A   Occupational History  . Student    Social History Main Topics  . Smoking status: Never Smoker  . Smokeless tobacco: None  . Alcohol use 0.0 oz/week     Comment: Occasionaly  . Drug use: No  . Sexual activity: No   Other Topics Concern  . None   Social History Narrative   07/05/12 AHW  [name] was born in [place], [place] and she currently lives with her mother, father, and her younger brother. She has an older brother who lives in [place]. She graduated high school. She is currently attending [employer] studying biology. She plans to return to [employer] to complete a degree in marine biology.  She is currently unemployed. 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  She denies any history of legal problems. She reports that her social support system consists of her therapist. She enjoys playing video games, but watching, cocaine, sewing, white in nature documentaries, and hiking. 07/05/12 AHW  Regular exercise: no   Caffeine use: no   Additional Social History:                         Sleep: Fair  Appetite:  Fair  Current Medications: Current Outpatient Prescriptions  Medication Sig Dispense Refill  . acetaminophen (TYLENOL) 500 MG tablet Take 500 mg by mouth every 6 (six) hours as needed for [name].    Marland Kitchen. ALPRAZolam (XANAX) 0.5 MG tablet Take 1 tablet (0.5 mg total) by mouth as needed for sleep. 30 tablet 5  . ibuprofen (ADVIL,MOTRIN) 800 MG tablet Take 1 tablet (800 mg total) by mouth 3 (three) times daily. 21 tablet 0   . lamoTRIgine (LAMICTAL) 200 MG tablet Take 1 tablet (200 mg total) by mouth daily after supper. 30 tablet 5  . meclizine (ANTIVERT) 50 MG tablet Take 0.5 tablets (25 mg total) by mouth 3 (three) times daily as needed for dizziness or nausea. 30 tablet 0  . Naproxen Sodium (ALEVE PO) Take 2 tablets by mouth daily as needed (for [name]).    . norgestimate-ethinyl estradiol (SPRINTEC 28) 0.25-35 MG-MCG tablet Take 1 tablet by mouth daily.    . sertraline (ZOLOFT) 100 MG tablet 2 qam 60 tablet 6  . temazepam (RESTORIL) 15 MG capsule 1  qhs  May increase to 2  qhs 60 capsule 5  . traZODone (DESYREL) 100 MG tablet 3  qhs 90 tablet 0   No current facility-administered medications for this visit.     Lab Results: No results found for this or any previous visit (from the past 48 hour(s)).  Physical Findings: AIMS:  , ,  ,  ,    CIWA:    COWS:     Musculoskeletal: Strength & Muscle Tone: within normal limits Gait & Station: normal Patient leans: Right and N/A  Psychiatric Specialty Exam: ROS  Blood pressure 110/66, pulse 86, height 5\' 2"  (1.575 m), weight 186 lb 12.8 oz (84.7 kg).Body mass index is 34.17 kg/m.  General Appearance: Casual  Eye Contact::  Fair  Speech:  Clear and Coherent  Volume:  Normal  Mood:  Euthymic  Affect:  Blunt  Thought Process:  Coherent  Orientation:  Full (Time, Place, and Person)  Thought Content:  WDL  Suicidal Thoughts: No   Homicidal Thoughts:  No  Memory:  Negative  Judgement:  Fair  Insight:  Fair  Psychomotor Activity:  NA and Normal  Concentration:  Good  Recall:  Fair  Fund of Knowledge:Fair  Language: Fair  Akathisia:  No  Handed:  Right  AIMS (if indicated):     Assets:  Communication Skills  ADL's:  Intact  Cognition: WNL  Sleep:      Treatment Plan Summary: 11/12/2015, 5:09 PM Patient ID: [name], female   DOB: 01/25/1990, 25 y.o.   MRN: [id number] At this time the patient will continue all her medications and she'll return to  see me in one month for reevaluation. At that time will consider slowly tapering off her Zoloft over of months. She is very sensitive about reducing the medicine. The possibility of adding Wellbutrin in this taper should be considered. This patient is not suicidal. She is looking for to her final exams.

## 2015-12-26 ENCOUNTER — Ambulatory Visit (HOSPITAL_COMMUNITY): Payer: Self-pay | Admitting: Psychiatry

## 2015-12-29 ENCOUNTER — Other Ambulatory Visit (HOSPITAL_COMMUNITY): Payer: Self-pay | Admitting: Psychiatry

## 2015-12-29 MED FILL — lamoTRIgine 200 MG TABS: 200 | 30 days supply | Qty: 30 | Fill #1

## 2015-12-29 MED FILL — SERTRALINE HCL 100 MG TAB: 100 | 30 days supply | Qty: 60 | Fill #1

## 2016-01-13 DIAGNOSIS — J069 Acute upper respiratory infection, unspecified: Secondary | ICD-10-CM | POA: Diagnosis not present

## 2016-01-14 MED FILL — AMOXICILLIN 875 MG TABLET: 875 | 10 days supply | Qty: 20 | Fill #0

## 2016-01-16 DIAGNOSIS — F333 Major depressive disorder, recurrent, severe with psychotic symptoms: Secondary | ICD-10-CM | POA: Diagnosis not present

## 2016-01-16 MED FILL — traZODone HCL 100 MG TABS: 100 | 90 days supply | Qty: 180 | Fill #0

## 2016-01-20 DIAGNOSIS — F333 Major depressive disorder, recurrent, severe with psychotic symptoms: Secondary | ICD-10-CM | POA: Diagnosis not present

## 2016-01-27 MED FILL — MONO-LINYAH 28 TABLET: 0.25-35 | 84 days supply | Qty: 112 | Fill #2

## 2016-01-27 MED FILL — SERTRALINE HCL 100 MG TAB: 100 | 30 days supply | Qty: 60 | Fill #2

## 2016-01-27 MED FILL — lamoTRIgine 200 MG TABS: 200 | 30 days supply | Qty: 30 | Fill #2

## 2016-02-13 DIAGNOSIS — F333 Major depressive disorder, recurrent, severe with psychotic symptoms: Secondary | ICD-10-CM | POA: Diagnosis not present

## 2016-02-24 MED FILL — lamoTRIgine 200 MG TABS: 200 | 30 days supply | Qty: 30 | Fill #3

## 2016-02-24 MED FILL — SERTRALINE HCL 100 MG TAB: 100 | 30 days supply | Qty: 60 | Fill #3

## 2016-02-24 MED FILL — ALPRAZolam 0.5 MG TABS: 0.5 | 30 days supply | Qty: 30 | Fill #1

## 2016-02-27 DIAGNOSIS — R5382 Chronic fatigue, unspecified: Secondary | ICD-10-CM | POA: Diagnosis not present

## 2016-02-27 DIAGNOSIS — F332 Major depressive disorder, recurrent severe without psychotic features: Secondary | ICD-10-CM | POA: Diagnosis not present

## 2016-03-01 DIAGNOSIS — F333 Major depressive disorder, recurrent, severe with psychotic symptoms: Secondary | ICD-10-CM | POA: Diagnosis not present

## 2016-03-04 ENCOUNTER — Ambulatory Visit (HOSPITAL_COMMUNITY)
Admission: AD | Admit: 2016-03-04 | Discharge: 2016-03-04 | Disposition: A | Discharge status: Home / Self Care | Payer: 59 | Attending: Psychiatry | Admitting: Psychiatry

## 2016-03-04 ENCOUNTER — Encounter (HOSPITAL_COMMUNITY): Payer: Self-pay | Admitting: Behavioral Health

## 2016-03-04 DIAGNOSIS — R450 Nervousness: Secondary | ICD-10-CM | POA: Diagnosis not present

## 2016-03-04 DIAGNOSIS — F329 Major depressive disorder, single episode, unspecified: Secondary | ICD-10-CM | POA: Insufficient documentation
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
## 2016-03-04 NOTE — H&P (Signed)
Behavioral Health Medical Screening Exam  [name] is an 26 y.o. female.  Total Time spent with patient: 15 minutes  Psychiatric Specialty Exam: Physical Exam  Constitutional: She is oriented to person, place, and time. She appears well-developed and well-nourished.  HENT:  Head: Normocephalic and atraumatic.  Right Ear: External ear normal.  Left Ear: External ear normal.  Neck: Normal range of motion.  Cardiovascular: Normal rate, normal heart sounds and intact distal pulses.   Respiratory: Effort normal and breath sounds normal.  GI: Soft. Bowel sounds are normal.  Neurological: She is alert and oriented to person, place, and time.  Skin: Skin is warm and dry.    Review of Systems  Psychiatric/Behavioral: Positive for depression. Negative for hallucinations, memory loss, substance abuse and suicidal ideas. The patient is nervous/anxious. The patient does not have insomnia.   All other systems reviewed and are negative.   Blood pressure 115/69, pulse 85, temperature 98.1 F (36.7 C), temperature source Oral, resp. rate 16, SpO2 100 %.There is no height or weight on file to calculate BMI.  General Appearance: Casual and Fairly Groomed  Eye Contact:  Good  Speech:  Clear and Coherent and Normal Rate  Volume:  Normal  Mood:  Anxious and Depressed  Affect:  Congruent, Depressed and Flat  Thought Process:  Coherent, Goal Directed and Linear  Orientation:  Full (Time, Place, and Person)  Thought Content:  Logical  Suicidal Thoughts:  No  Homicidal Thoughts:  No  Memory:  Immediate;   Good Recent;   Good Remote;   Fair  Judgement:  Good  Insight:  Good  Psychomotor Activity:  Normal  Concentration: Concentration: Good and Attention Span: Good  Recall:  Good  Fund of Knowledge:Good  Language: Good  Akathisia:  No  Handed:  Right  AIMS (if indicated):     Assets:  Communication Skills Desire for Improvement Financial Resources/Insurance Housing Leisure  Time Physical Health Resilience Social Support Transportation Vocational/Educational  Sleep:   good    Musculoskeletal: Strength & Muscle Tone: within normal limits Gait & Station: normal Patient leans: N/A  Blood pressure 115/69, pulse 85, temperature 98.1 F (36.7 C), temperature source Oral, resp. rate 16, SpO2 100 %.  Recommendations:  Based on my evaluation the patient does not appear to have an emergency medical condition.  Laveda AbbeLaurie Britton Ahlani Wickes, NP 03/04/2016, 2:07 PM

## 2016-03-04 NOTE — BH Assessment (Signed)
Pt presented this date as a walk in- filled out walk in form. She left without being seen. She denied any SI/HI or psychosis.

## 2016-03-08 DIAGNOSIS — F909 Attention-deficit hyperactivity disorder, unspecified type: Secondary | ICD-10-CM | POA: Diagnosis not present

## 2016-03-08 DIAGNOSIS — R002 Palpitations: Secondary | ICD-10-CM | POA: Diagnosis not present

## 2016-03-08 DIAGNOSIS — G43909 Migraine, unspecified, not intractable, without status migrainosus: Secondary | ICD-10-CM | POA: Diagnosis not present

## 2016-03-08 DIAGNOSIS — F329 Major depressive disorder, single episode, unspecified: Secondary | ICD-10-CM | POA: Diagnosis not present

## 2016-03-08 DIAGNOSIS — R55 Syncope and collapse: Secondary | ICD-10-CM | POA: Diagnosis not present

## 2016-03-11 ENCOUNTER — Telehealth: Payer: Self-pay

## 2016-03-11 NOTE — Telephone Encounter (Signed)
SENT NOTES TO SCHEDULING 

## 2016-03-15 ENCOUNTER — Encounter: Payer: Self-pay | Admitting: Internal Medicine

## 2016-03-15 ENCOUNTER — Ambulatory Visit (INDEPENDENT_AMBULATORY_CARE_PROVIDER_SITE_OTHER): Payer: 59 | Admitting: Internal Medicine

## 2016-03-15 VITALS — BP 116/74 | HR 89 | Ht 62.0 in | Wt 190.2 lb

## 2016-03-15 DIAGNOSIS — R002 Palpitations: Secondary | ICD-10-CM | POA: Diagnosis not present

## 2016-03-15 DIAGNOSIS — R42 Dizziness and giddiness: Secondary | ICD-10-CM

## 2016-03-15 DIAGNOSIS — R55 Syncope and collapse: Secondary | ICD-10-CM

## 2016-03-15 NOTE — Patient Instructions (Addendum)
Medication Instructions:  Your physician recommends that you continue on your current medications as directed. Please refer to the Current Medication list given to you today.   Labwork: None Ordered   Testing/Procedures: Your physician has recommended that you wear an 4 week event monitor. Event monitors are medical devices that record the heart's electrical activity. Doctors most often us these monitors to diagnose arrhythmias. Arrhythmias are problems with the speed or rhythm of the heartbeat. The monitor is a small, portable device. You can wear one while you do your normal daily activities. This is usually used to diagnose what is causing palpitations/syncope (passing out).     Follow-Up: Your physician recommends that you schedule a follow-up appointment in: 8-10 weeks with Dr. Ladona Ridgelaylor    Any Other Special Instructions Will Be Listed Below (If Applicable).     If you need a refill on your cardiac medications before your next appointment, please call your pharmacy.

## 2016-03-15 NOTE — Progress Notes (Addendum)
HPI Ms. Ottis StainHuff is referred today for evaluation of near syncope. She is a 26 yo woman with a h/o depression, anxiety , and ADHD. She noted spells of near syncope dating back to age 26 which got better when she stopped Cymbalta. She has had recurrent symptoms over the past year. They can occur at any time and are associated with feelings of lightheadedness and dizziness. She will lie down when they occur and the episodes resolved. She has not bitten her tongue, lost control of her bladder or bowels, and has not hurt herself. At times she will feel nausea. Sometimes associate are headaches. She has been thought to have polycystic ovaries.   Allergies  Allergen Reactions  . Cymbalta [Duloxetine Hcl] Other (See Comments)    Hypotension  . Daytrana [Methylphenidate] Rash    Like a sunburn     Current Outpatient Prescriptions  Medication Sig Dispense Refill  . ALPRAZolam (XANAX) 0.5 MG tablet Take 1 tablet (0.5 mg total) by mouth as needed for sleep. 30 tablet 5  . lamoTRIgine (LAMICTAL) 200 MG tablet Take 1 tablet (200 mg total) by mouth daily after supper. 30 tablet 5  . Naproxen Sodium (ALEVE PO) Take 2 tablets by mouth daily as needed (for pain).    . norgestimate-ethinyl estradiol (SPRINTEC 28) 0.25-35 MG-MCG tablet Take 1 tablet by mouth daily.    . sertraline (ZOLOFT) 100 MG tablet 2 qam 60 tablet 6  . traZODone (DESYREL) 100 MG tablet TAKE 3 TABLETS BY MOUTH AT BEDTIME 90 tablet 0   No current facility-administered medications for this visit.      Past Medical History:  Diagnosis Date  . ADHD (attention deficit hyperactivity disorder)   . Anxiety   . Depression   . Environmental allergies   . GERD (gastroesophageal reflux disease)   . Myopia of both eyes   . Obesity     ROS:   All systems reviewed and negative except as noted in the HPI.   History reviewed. No pertinent surgical history.   Family History  Problem Relation Age of Onset  . Bipolar disorder Father    . Diabetes Father   . Hyperlipidemia Father   . Bipolar disorder Brother   . Depression Mother   . Hyperlipidemia Mother   . Diabetes Maternal Grandmother   . Heart disease Maternal Grandmother   . Hyperlipidemia Maternal Grandmother   . Hypertension Maternal Grandmother   . Heart disease Maternal Grandfather   . Hyperlipidemia Maternal Grandfather   . Heart disease Paternal Grandmother   . Hypertension Paternal Grandmother   . Cancer Paternal Grandfather   . Diabetes Paternal Grandfather   . Bipolar disorder Paternal Aunt      Social History   Social History  . Marital status: Single    Spouse name: N/A  . Number of children: N/A  . Years of education: N/A   Occupational History  . Student    Social History Main Topics  . Smoking status: Never Smoker  . Smokeless tobacco: Never Used  . Alcohol use 0.0 oz/week     Comment: Occasionaly  . Drug use: No  . Sexual activity: No   Other Topics Concern  . Not on file   Social History Narrative   07/05/12 AHW  [name] was born in [place], [place] and she currently lives with her mother, father, and her younger brother. She has an older brother who lives in [place]. She graduated high school. She is currently  attending [employer] studying biology. She plans to return to [employer] to complete a degree in marine biology.  She is currently unemployed. 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  She denies any history of legal problems. She reports that her social support system consists of her therapist. She enjoys playing video games, bird watching, cooking, sewing, white in nature documentaries, and hiking. 07/05/12 AHW      Regular exercise: no   Caffeine use: no     BP 116/74   Pulse 89   Ht 5\' 2"  (1.575 m)   Wt 190 lb 3.2 oz (86.3 kg)   LMP  (LMP Unknown)   SpO2 97%   BMI 34.79 kg/m   Physical Exam:  Well appearing NAD HEENT: Unremarkable Neck:  6 cm JVD, no thyromegally Lymphatics:  No adenopathy Back:   No CVA tenderness Lungs:  Clear with no wheezes HEART:  Regular rate rhythm, no murmurs, no rubs, no clicks Abd:  soft, positive bowel sounds, no organomegally, no rebound, no guarding Ext:  2 plus pulses, no edema, no cyanosis, no clubbing Skin:  No rashes no nodules Neuro:  CN II through XII intact, motor grossly intact  EKG - nsr  DEVICE  Normal device function.  See PaceArt for details.   Assess/Plan: 1. Near syncope - Her symptoms have some typical but mostly atypical features for autonomic dysfunction. I have asked her to wear a cardiac monitor for several weeks. She is encouraged to increase her salt intake. I might consider an emperic trial of midodrine.  2. Anxiety - she will continue her current meds. I do not suspect she has SVT based on her symptoms.  Sharlot Gowda Edy Mcbane,MD

## 2016-03-19 DIAGNOSIS — R55 Syncope and collapse: Secondary | ICD-10-CM | POA: Diagnosis not present

## 2016-03-19 DIAGNOSIS — R05 Cough: Secondary | ICD-10-CM | POA: Diagnosis not present

## 2016-03-19 MED FILL — BENZONATATE 100 MG CAP: 100 | 10 days supply | Qty: 30 | Fill #0

## 2016-03-23 ENCOUNTER — Ambulatory Visit (INDEPENDENT_AMBULATORY_CARE_PROVIDER_SITE_OTHER): Payer: 59

## 2016-03-23 DIAGNOSIS — R42 Dizziness and giddiness: Secondary | ICD-10-CM

## 2016-03-23 DIAGNOSIS — R002 Palpitations: Secondary | ICD-10-CM

## 2016-03-23 DIAGNOSIS — R55 Syncope and collapse: Secondary | ICD-10-CM

## 2016-03-24 ENCOUNTER — Ambulatory Visit (INDEPENDENT_AMBULATORY_CARE_PROVIDER_SITE_OTHER): Payer: 59 | Admitting: Neurology

## 2016-03-24 ENCOUNTER — Encounter: Payer: Self-pay | Admitting: Neurology

## 2016-03-24 VITALS — BP 105/62 | HR 85 | Ht 62.0 in | Wt 187.1 lb

## 2016-03-24 DIAGNOSIS — G450 Vertebro-basilar artery syndrome: Secondary | ICD-10-CM

## 2016-03-24 DIAGNOSIS — G40109 Localization-related (focal) (partial) symptomatic epilepsy and epileptic syndromes with simple partial seizures, not intractable, without status epilepticus: Secondary | ICD-10-CM

## 2016-03-24 NOTE — Progress Notes (Signed)
[facility] [address] [place]. [place] [address] [phone]       OFFICE CONSULT NOTE  [name] Date of Birth:  10/03/1990 Medical Record Number:  [id number]   Referring MD: Juluis Rainier  Reason for Referral:  Spells  HPI: [name] is a pleasant young Caucasian lady who has been having recurrent transient stereotypical episodes for the last few years. These appear to have increase in frequency over the last several months and now occur at a frequency of once per week. She is unable to provide specific triggers or order. She states she gets flushed during the episode and breaks out into a sweat all over. She feels lightheaded and as if she is about to pass out. She feels better if she lies down. She feels confused for a couple of minutes to the episode resolves. She has never fallen down or hurt herself. She states she is fully aware of her surroundings. She feels these episodes may sometimes trigger migraine headaches though most of them are not accompanied by headaches. She does have a remote history of syncope at age 47 and felt that was related to medication Cymbalta which she was on and these episodes of syncope stopped after she discontinued it. She has recently seen Dr. Sharrell Ku cardiologist was placed on external heart monitor on her yesterday. Patient has not checked her sugar in any of these episodes. She does have long-standing history of anxiety and depression and takes lamotrigine and Zoloft. She also takes Xanax when necessary. She is categorically states that she does not feel anxious during these episodes though she has had anxiety attacks before she states they are different. She is currently [name] She did has occasional chest discomfort in his episodes but not with all of them. She is never completely lost consciousness. She denies any double vision, vertigo, focal extremity weakness numbness gait or  balance problems. She's not had any brain imaging studies done or EEG done.  ROS:   14 system review of systems is positive for  weight gain, fatigue, chest pain, ringing in the ears, spinning sensation, blurred vision, shortness of breath, feeling hot, flushing, joint pain, cramps, allergies, confusion, headache, weakness, dizziness, anxiety, depression, lack of sleep, decreased energy, insomnia and sleepiness and all other systems negative PMH:  Past Medical History:  Diagnosis Date  . ADHD (attention deficit hyperactivity disorder)   . Anxiety   . Depression   . Environmental allergies   . GERD (gastroesophageal reflux disease)   . Myopia of both eyes   . Obesity   . Syncope and collapse     Social History:  Social History   Social History  . Marital status: Single    Spouse name: N/A  . Number of children: N/A  . Years of education: N/A   Occupational History  . Student    Social History Main Topics  . Smoking status: Never Smoker  . Smokeless tobacco: Never Used  . Alcohol use 0.6 oz/week    1 Glasses of wine per week     Comment: Occasionaly  . Drug use: No  . Sexual activity: No   Other Topics Concern  . Not on file   Social History Narrative   07/05/12 AHW  [name] was born in [place], [place] and she currently lives with her mother, father, and her younger brother. She has an older brother who lives in [place]. [place]. She graduated high school. She is  currently attending [employer] studying biology. She plans to return to [employer] to complete a degree in marine biology.  She is currently unemployed. 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  She denies any history of legal problems. She reports that her social support system consists of her therapist. She enjoys playing video games, but watching, cocaine, sewing, white in nature documentaries, and hiking. 07/05/12 AHW      Regular exercise: no   Caffeine use: no    Medications:   Current Outpatient  Prescriptions on File Prior to Visit  Medication Sig Dispense Refill  . ALPRAZolam (XANAX) 0.5 MG tablet Take 1 tablet (0.5 mg total) by mouth as needed for sleep. 30 tablet 5  . lamoTRIgine (LAMICTAL) 200 MG tablet Take 1 tablet (200 mg total) by mouth daily after supper. 30 tablet 5  . Naproxen Sodium (ALEVE PO) Take 2 tablets by mouth daily as needed (for pain).    . norgestimate-ethinyl estradiol (SPRINTEC 28) 0.25-35 MG-MCG tablet Take 1 tablet by mouth daily.    . sertraline (ZOLOFT) 100 MG tablet 2 qam (Patient taking differently: 100 mg. 2 qam) 60 tablet 6  . traZODone (DESYREL) 100 MG tablet TAKE 3 TABLETS BY MOUTH AT BEDTIME (Patient taking differently: TAKE 2 TABLETS BY MOUTH AT BEDTIME) 90 tablet 0   No current facility-administered medications on file prior to visit.     Allergies:   Allergies  Allergen Reactions  . Cymbalta [Duloxetine Hcl] Other (See Comments)    Hypotension  . Daytrana [Methylphenidate] Rash    Like a sunburn    Physical Exam General: Obese young Caucasian lady, seated, in no evident distress Head: head normocephalic and atraumatic.   Neck: supple with no carotid or supraclavicular bruits Cardiovascular: regular rate and rhythm, no murmurs Musculoskeletal: no deformity Skin:  no rash/petichiae Vascular:  Normal pulses all extremities  Neurologic Exam Mental Status: Awake and fully alert. Oriented to place and time. Recent and remote memory intact. Attention span, concentration and fund of knowledge appropriate. Mood and affect appropriate.  Cranial Nerves: Fundoscopic exam reveals sharp disc margins. Pupils equal, briskly reactive to light. Extraocular movements full without nystagmus. Visual fields full to confrontation. Hearing intact. Facial sensation intact. Face, tongue, palate moves normally and symmetrically.  Motor: Normal bulk and tone. Normal strength in all tested extremity muscles. Sensory.: intact to touch , pinprick , position and  vibratory sensation.  Coordination: Rapid alternating movements normal in all extremities. Finger-to-nose and heel-to-shin performed accurately bilaterally. Gait and Station: Arises from chair without difficulty. Stance is normal. Gait demonstrates normal stride length and balance . Able to heel, toe and tandem walk without difficulty.  Reflexes: 1+ and symmetric. Toes downgoing.   NIHSS  0 Modified Rankin  0   ASSESSMENT: 52 year Caucasian lady with recurrent stereotypical episodes of transient near-syncopal events followed with confusion of unclear etiology. Differential includes cardiac arrhythmias, vertebrobasilar TIAs or complex partial seizures. Unclear whether underlying anxiety/depression is contributing    PLAN: I had a long discussion with the patient and her mother regarding her recurrent stereotypical episodes which are of unclear etiology. Possibilities include cardiac arrhythmias versus vertebrobasilar TIAs or complex partial seizures. I recommend checking MRI scan of the brain, MRA of the brain and neck and EEG. Continue ongoing 30 day cardiac monitoring. If no specific etiology is found after about testing she may need to discuss with her psychiatrist adjusting her anxiety medications or changing her birth control pill. Greater than 50% time during this 45  minute consultation visit was spent on counseling and coordination of care about her recurrent spells, discussion of differential diagnosis, evaluation plan and answering questions She will return for follow-up in 6 weeks or call earlier if necessary. Delia HeadyPramod Autry Prust, MD  [name] [address] [place], [place] [id number]  Phone [phone] Fax [phone] Note: This document was prepared with digital dictation and possible smart phrase technology. Any transcriptional errors that result from this process are unintentional.

## 2016-03-24 NOTE — Patient Instructions (Signed)
I had a long discussion with the patient and her mother regarding her recurrent stereotypical episodes which are of unclear etiology. Possibilities include cardiac arrhythmias versus vertebrobasilar TIAs or complex partial seizures. I recommend checking MRI scan of the brain, MRA of the brain and neck and EEG. Continue ongoing 30 day cardiac monitoring. If no specific etiology is found after about testing she may need to discuss with her psychiatrist adjusting her anxiety medications or changing her birth control pill. She will return for follow-up in 6 weeks or call earlier if necessary.

## 2016-03-29 ENCOUNTER — Telehealth: Payer: Self-pay | Admitting: Neurology

## 2016-03-29 MED FILL — lamoTRIgine 200 MG TABS: 200 | 30 days supply | Qty: 30 | Fill #4

## 2016-03-29 MED FILL — SERTRALINE HCL 100 MG TAB: 100 | 30 days supply | Qty: 60 | Fill #4

## 2016-03-29 NOTE — Telephone Encounter (Signed)
Patient called office returning call in reference to scheduling MRI's.  Please call

## 2016-03-29 NOTE — Telephone Encounter (Signed)
I spoke with the patient.. She didn't know she was suppose to have 3 exams.. Also, she wanted to know an estimate of how much it would be and I informed her.. She said she was going to talk to her mom and get back to me about scheduling her MRI.

## 2016-04-01 ENCOUNTER — Ambulatory Visit (INDEPENDENT_AMBULATORY_CARE_PROVIDER_SITE_OTHER): Payer: 59

## 2016-04-01 ENCOUNTER — Telehealth: Payer: Self-pay | Admitting: Neurology

## 2016-04-01 DIAGNOSIS — G450 Vertebro-basilar artery syndrome: Secondary | ICD-10-CM

## 2016-04-01 DIAGNOSIS — G40109 Localization-related (focal) (partial) symptomatic epilepsy and epileptic syndromes with simple partial seizures, not intractable, without status epilepticus: Secondary | ICD-10-CM

## 2016-04-01 NOTE — Telephone Encounter (Signed)
Pt wants to know if the MRI w/ contrast is necessary, she does not do well with needles.

## 2016-04-05 DIAGNOSIS — Z3200 Encounter for pregnancy test, result unknown: Secondary | ICD-10-CM | POA: Diagnosis not present

## 2016-04-05 DIAGNOSIS — N939 Abnormal uterine and vaginal bleeding, unspecified: Secondary | ICD-10-CM | POA: Diagnosis not present

## 2016-04-05 NOTE — Telephone Encounter (Signed)
Yes as MRA of neck without the dye injection is not as good

## 2016-04-13 ENCOUNTER — Telehealth: Payer: Self-pay

## 2016-04-13 MED FILL — traZODone HCL 100 MG TABS: 100 | 90 days supply | Qty: 180 | Fill #0

## 2016-04-13 NOTE — Telephone Encounter (Signed)
Left vm for patient to call back about EEG results.  

## 2016-04-13 NOTE — Telephone Encounter (Signed)
-----   Message from Micki Riley, MD sent at 04/09/2016  2:43 PM EDT ----- Joneen Roach inform patient had EEG study was normal

## 2016-04-15 NOTE — Telephone Encounter (Signed)
RN call patient that her EEG was normal. Pt verbalized understanding.

## 2016-04-23 MED FILL — SERTRALINE HCL 100 MG TAB: 100 | 30 days supply | Qty: 60 | Fill #5

## 2016-04-23 MED FILL — lamoTRIgine 200 MG TABS: 200 | 30 days supply | Qty: 30 | Fill #5

## 2016-04-27 ENCOUNTER — Encounter: Payer: Self-pay | Admitting: Internal Medicine

## 2016-04-27 DIAGNOSIS — F333 Major depressive disorder, recurrent, severe with psychotic symptoms: Secondary | ICD-10-CM | POA: Diagnosis not present

## 2016-05-06 ENCOUNTER — Ambulatory Visit (INDEPENDENT_AMBULATORY_CARE_PROVIDER_SITE_OTHER): Payer: 59 | Admitting: Neurology

## 2016-05-06 ENCOUNTER — Encounter: Payer: Self-pay | Admitting: Neurology

## 2016-05-06 VITALS — BP 111/69 | HR 92 | Ht 62.0 in | Wt 189.4 lb

## 2016-05-06 DIAGNOSIS — G450 Vertebro-basilar artery syndrome: Secondary | ICD-10-CM | POA: Diagnosis not present

## 2016-05-06 DIAGNOSIS — R6889 Other general symptoms and signs: Secondary | ICD-10-CM | POA: Diagnosis not present

## 2016-05-06 DIAGNOSIS — G40109 Localization-related (focal) (partial) symptomatic epilepsy and epileptic syndromes with simple partial seizures, not intractable, without status epilepticus: Secondary | ICD-10-CM
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
## 2016-05-06 NOTE — Patient Instructions (Signed)
I had a long discussion with the patient regarding her spells continue and remain of unclear etiology most likely presyncopal events. Cardiac monitoring as well as EEG have both been unremarkable and patient cannot afford MRI due to financial constraints. I do not believe further neurological testing is indicated at the present time. Advised to keep upcoming follow-up appointment with cardiologist. No routine follow-up appointment is necessary.

## 2016-05-13 ENCOUNTER — Ambulatory Visit (INDEPENDENT_AMBULATORY_CARE_PROVIDER_SITE_OTHER): Payer: 59 | Admitting: Internal Medicine

## 2016-05-13 ENCOUNTER — Encounter: Payer: Self-pay | Admitting: Internal Medicine

## 2016-05-13 ENCOUNTER — Other Ambulatory Visit: Payer: Self-pay

## 2016-05-13 VITALS — BP 110/72 | HR 88 | Ht 62.0 in | Wt 186.8 lb

## 2016-05-13 DIAGNOSIS — R55 Syncope and collapse: Secondary | ICD-10-CM | POA: Diagnosis not present

## 2016-05-13 NOTE — Patient Instructions (Addendum)
Medication Instructions:  Your physician recommends that you continue on your current medications as directed. Please refer to the Current Medication list given to you today.   Labwork: None ordered  Testing/Procedures: None ordered  Follow-Up: Your physician wants you to follow-up in: 6 months with Ladona Ridgelaylor. You will receive a reminder letter in the mail two months in advance. If you don't receive a letter, please call our office to schedule the follow-up appointment.   Any Other Special Instructions Will Be Listed Below (If Applicable).     If you need a refill on your cardiac medications before your next appointment, please call your pharmacy.

## 2016-05-13 NOTE — Progress Notes (Addendum)
HPI [name] returns today for followup. She is a pleasant 26 yo woman with almost certain autonomic dysfunction as well as other medical and psychiatric problems who I initially saw a couple of months ago. The patient wore a cardiac monitor which showed no arrhythmias. She has not had frank syncope since I saw her last. She has had a couple of spells where she had to lie down quickly, avoiding passing out. She has felt some fatigue and admits to not exercising. Allergies  Allergen Reactions  . Cymbalta [Duloxetine Hcl] Other (See Comments)    Hypotension  . Daytrana [Methylphenidate] Rash    Like a sunburn  . Duloxetine Other (See Comments)    Fainting     Current Outpatient Prescriptions  Medication Sig Dispense Refill  . ALPRAZolam (XANAX) 0.5 MG tablet Take 1 tablet (0.5 mg total) by mouth as needed for sleep. 30 tablet 5  . lamoTRIgine (LAMICTAL) 200 MG tablet Take 1 tablet (200 mg total) by mouth daily after supper. 30 tablet 5  . Multiple Vitamins-Minerals (MULTIVITAL) tablet Take 1 tablet by mouth daily. Gummy    . Naproxen Sodium (ALEVE PO) Take 2 tablets by mouth daily as needed (for pain).    . norgestimate-ethinyl estradiol (SPRINTEC 28) 0.25-35 MG-MCG tablet Take 1 tablet by mouth daily.    . sertraline (ZOLOFT) 100 MG tablet Take 2 tablets by mouth every morning.    . traZODone (DESYREL) 100 MG tablet Take 200 mg by mouth at bedtime.     No current facility-administered medications for this visit.      Past Medical History:  Diagnosis Date  . ADHD (attention deficit hyperactivity disorder)   . Anxiety   . Depression   . Environmental allergies   . GERD (gastroesophageal reflux disease)   . Myopia of both eyes   . Obesity   . Syncope and collapse     ROS:   All systems reviewed and negative except as noted in the HPI.   No past surgical history on file.   Family History  Problem Relation Age of Onset  . Bipolar disorder Father   . Diabetes  Father   . Hyperlipidemia Father   . Bipolar disorder Brother   . Depression Mother   . Hyperlipidemia Mother   . Diabetes Maternal Grandmother   . Heart disease Maternal Grandmother   . Hyperlipidemia Maternal Grandmother   . Hypertension Maternal Grandmother   . Heart disease Maternal Grandfather   . Hyperlipidemia Maternal Grandfather   . Heart disease Paternal Grandmother   . Hypertension Paternal Grandmother   . Cancer Paternal Grandfather   . Diabetes Paternal Grandfather   . Bipolar disorder Paternal Aunt      Social History   Social History  . Marital status: Single    Spouse name: N/A  . Number of children: N/A  . Years of education: N/A   Occupational History  . Student    Social History Main Topics  . Smoking status: Never Smoker  . Smokeless tobacco: Never Used  . Alcohol use 0.6 oz/week    1 Glasses of wine per week     Comment: Occasionaly  . Drug use: No  . Sexual activity: No   Other Topics Concern  . Not on file   Social History Narrative   07/05/12 AHW  [name] was born in [place], [place] and she currently lives with her mother, father, and her younger brother. She has an older brother who  lives in [place]. She graduated high school. She is currently attending [employer] studying biology. She plans to return to [employer] to complete a degree in marine biology.  She is currently unemployed. 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  She denies any history of legal problems. She reports that her social support system consists of her therapist. She enjoys playing video games, bird watching, cooking, sewing, white in nature documentaries, and hiking. 07/05/12 AHW      Regular exercise: no   Caffeine use: no     BP 110/72   Pulse 88   Ht 5\' 2"  (1.575 m)   Wt 186 lb 12.8 oz (84.7 kg)   SpO2 98%   BMI 34.17 kg/m   Physical Exam:  Well appearing overweight, 26 yo woman, NAD HEENT: Unremarkable Neck:  6 cm JVD, no thyromegally Lymphatics:   No adenopathy Back:  No CVA tenderness Lungs:  Clear with no wheezes HEART:  Regular rate rhythm, no murmurs, no rubs, no clicks Abd:  soft, positive bowel sounds, no organomegally, no rebound, no guarding Ext:  2 plus pulses, no edema, no cyanosis, no clubbing Skin:  No rashes no nodules Neuro:  CN II through XII intact, motor grossly intact  EKG - not done today  DEVICE  Normal device function.  See PaceArt for details.   Assess/Plan: 1. Autonomic dysfunction - we discussed the nature of her problem, the importance of adequate hydration and salt intake, avoidance of ETOH and caffeine, and exercise. I do not think she should be placed on florinef or midodrine at this point in time. 2. Obesity -she is overweight and I have encouraged her to lose weight.

## 2016-05-18 DIAGNOSIS — F333 Major depressive disorder, recurrent, severe with psychotic symptoms: Secondary | ICD-10-CM | POA: Diagnosis not present

## 2016-05-19 MED FILL — lamoTRIgine 200 MG TABS: 200 | 90 days supply | Qty: 90 | Fill #0

## 2016-05-19 MED FILL — SERTRALINE HCL 100 MG TAB: 100 | 90 days supply | Qty: 180 | Fill #0

## 2016-05-21 DIAGNOSIS — Z30430 Encounter for insertion of intrauterine contraceptive device: Secondary | ICD-10-CM | POA: Diagnosis not present

## 2016-05-21 DIAGNOSIS — Z3202 Encounter for pregnancy test, result negative: Secondary | ICD-10-CM | POA: Diagnosis not present

## 2016-06-09 DIAGNOSIS — F333 Major depressive disorder, recurrent, severe with psychotic symptoms: Secondary | ICD-10-CM | POA: Diagnosis not present

## 2016-06-23 DIAGNOSIS — Z30431 Encounter for routine checking of intrauterine contraceptive device: Secondary | ICD-10-CM | POA: Diagnosis not present

## 2016-06-29 DIAGNOSIS — F333 Major depressive disorder, recurrent, severe with psychotic symptoms: Secondary | ICD-10-CM | POA: Diagnosis not present

## 2016-07-06 DIAGNOSIS — F333 Major depressive disorder, recurrent, severe with psychotic symptoms: Secondary | ICD-10-CM | POA: Diagnosis not present

## 2016-07-12 MED FILL — traZODone HCL 100 MG TABS: 100 | 90 days supply | Qty: 180 | Fill #0

## 2016-07-13 DIAGNOSIS — F333 Major depressive disorder, recurrent, severe with psychotic symptoms: Secondary | ICD-10-CM | POA: Diagnosis not present

## 2016-07-20 DIAGNOSIS — F333 Major depressive disorder, recurrent, severe with psychotic symptoms: Secondary | ICD-10-CM | POA: Diagnosis not present

## 2016-07-21 DIAGNOSIS — F333 Major depressive disorder, recurrent, severe with psychotic symptoms: Secondary | ICD-10-CM | POA: Diagnosis not present

## 2016-08-03 DIAGNOSIS — F332 Major depressive disorder, recurrent severe without psychotic features: Secondary | ICD-10-CM | POA: Diagnosis not present

## 2016-08-23 MED FILL — lamoTRIgine 200 MG TABS: 200 | 90 days supply | Qty: 90 | Fill #0

## 2016-08-23 MED FILL — SERTRALINE HCL 100 MG TAB: 100 | 90 days supply | Qty: 180 | Fill #0

## 2016-08-25 DIAGNOSIS — F332 Major depressive disorder, recurrent severe without psychotic features: Secondary | ICD-10-CM | POA: Diagnosis not present

## 2016-08-25 MED FILL — ESCITALOPRAM 20 MG TABLET: 20 | 30 days supply | Qty: 30 | Fill #0

## 2016-08-25 MED FILL — ALPRAZolam 0.5 MG TABS: 0.5 | 30 days supply | Qty: 30 | Fill #0

## 2016-08-26 DIAGNOSIS — F332 Major depressive disorder, recurrent severe without psychotic features: Secondary | ICD-10-CM | POA: Diagnosis not present

## 2016-09-02 DIAGNOSIS — F332 Major depressive disorder, recurrent severe without psychotic features: Secondary | ICD-10-CM | POA: Diagnosis not present

## 2016-09-09 DIAGNOSIS — F332 Major depressive disorder, recurrent severe without psychotic features: Secondary | ICD-10-CM | POA: Diagnosis not present

## 2016-09-16 DIAGNOSIS — F332 Major depressive disorder, recurrent severe without psychotic features: Secondary | ICD-10-CM | POA: Diagnosis not present

## 2016-09-22 DIAGNOSIS — F332 Major depressive disorder, recurrent severe without psychotic features: Secondary | ICD-10-CM | POA: Diagnosis not present

## 2016-09-30 DIAGNOSIS — F332 Major depressive disorder, recurrent severe without psychotic features: Secondary | ICD-10-CM | POA: Diagnosis not present

## 2016-10-05 MED FILL — ESCITALOPRAM 20 MG TABLET: 20 | 90 days supply | Qty: 90 | Fill #0

## 2016-10-05 MED FILL — traZODone HCL 100 MG TABS: 100 | 90 days supply | Qty: 180 | Fill #0

## 2016-10-07 DIAGNOSIS — F332 Major depressive disorder, recurrent severe without psychotic features: Secondary | ICD-10-CM | POA: Diagnosis not present

## 2016-10-14 DIAGNOSIS — F332 Major depressive disorder, recurrent severe without psychotic features: Secondary | ICD-10-CM | POA: Diagnosis not present

## 2016-10-28 DIAGNOSIS — F332 Major depressive disorder, recurrent severe without psychotic features: Secondary | ICD-10-CM | POA: Diagnosis not present

## 2016-11-11 DIAGNOSIS — F332 Major depressive disorder, recurrent severe without psychotic features: Secondary | ICD-10-CM | POA: Diagnosis not present

## 2016-11-18 DIAGNOSIS — F332 Major depressive disorder, recurrent severe without psychotic features: Secondary | ICD-10-CM | POA: Diagnosis not present

## 2016-11-19 DIAGNOSIS — F332 Major depressive disorder, recurrent severe without psychotic features: Secondary | ICD-10-CM | POA: Diagnosis not present

## 2016-11-22 MED FILL — lamoTRIgine 200 MG TABS: 200 | 90 days supply | Qty: 90 | Fill #0

## 2016-11-25 DIAGNOSIS — F332 Major depressive disorder, recurrent severe without psychotic features: Secondary | ICD-10-CM | POA: Diagnosis not present

## 2016-12-09 DIAGNOSIS — F332 Major depressive disorder, recurrent severe without psychotic features: Secondary | ICD-10-CM | POA: Diagnosis not present

## 2016-12-27 DIAGNOSIS — F332 Major depressive disorder, recurrent severe without psychotic features: Secondary | ICD-10-CM | POA: Diagnosis not present

## 2017-01-03 MED FILL — ESCITALOPRAM 20 MG TABLET: 20 | 90 days supply | Qty: 90 | Fill #0

## 2017-01-12 MED FILL — traZODone HCL 100 MG TABS: 100 | 90 days supply | Qty: 180 | Fill #0

## 2017-01-17 DIAGNOSIS — F332 Major depressive disorder, recurrent severe without psychotic features: Secondary | ICD-10-CM | POA: Diagnosis not present

## 2017-01-24 DIAGNOSIS — F332 Major depressive disorder, recurrent severe without psychotic features: Secondary | ICD-10-CM | POA: Diagnosis not present

## 2017-01-31 DIAGNOSIS — F331 Major depressive disorder, recurrent, moderate: Secondary | ICD-10-CM | POA: Diagnosis not present

## 2017-02-07 DIAGNOSIS — F331 Major depressive disorder, recurrent, moderate: Secondary | ICD-10-CM | POA: Diagnosis not present

## 2017-02-08 ENCOUNTER — Telehealth: Payer: Self-pay | Admitting: Internal Medicine

## 2017-02-08 NOTE — Telephone Encounter (Signed)
New message   Pt c/o BP issue: STAT if pt c/o blurred vision, one-sided weakness or slurred speech  1. What are your last 5 BP readings? N/A  2. Are you having any other symptoms (ex. Dizziness, headache, blurred vision, passed out)? Dizziness, loss of vision, faint, nausea   3. What is your BP issue? Pt has low bp and says she needs meds

## 2017-02-08 NOTE — Telephone Encounter (Signed)
Returned call to Pt.  Pt has continued symptoms she had when she last saw Dr. Ladona Ridgelaylor.  Pt's mom told her there are medications that can help.  Asked Pt if she would like to schedule appt to discuss with Dr. Ladona Ridgelaylor?  Pt states yes, has appt 03/04/2017, would like to be seen sooner if possible.  Will give to scheduling.

## 2017-02-21 MED FILL — lamoTRIgine 200 MG TABS: 200 | 90 days supply | Qty: 90 | Fill #1

## 2017-03-04 ENCOUNTER — Ambulatory Visit: Payer: 59 | Admitting: Internal Medicine

## 2017-03-04 ENCOUNTER — Encounter: Payer: Self-pay | Admitting: Internal Medicine

## 2017-03-04 VITALS — BP 122/64 | HR 97 | Ht 62.0 in | Wt 196.0 lb

## 2017-03-04 DIAGNOSIS — R55 Syncope and collapse: Secondary | ICD-10-CM

## 2017-03-04 NOTE — Progress Notes (Addendum)
HPI [name] returns today for followup. She is a pleasant 27 yo woman with autonomic dysfunction as well as other medical and psychiatric problems who I initially saw a couple of months ago. The patient wore a cardiac monitor which showed no arrhythmias. She has not had frank syncope since I saw her last. She has had a couple of spells where she had to lie down quickly, avoiding passing out. She has felt some fatigue and admits to not exercising. Allergies  Allergen Reactions  . Cymbalta [Duloxetine Hcl] Other (See Comments)    Hypotension  . Daytrana [Methylphenidate] Rash    Like a sunburn  . Duloxetine Other (See Comments)    Fainting     Current Outpatient Medications  Medication Sig Dispense Refill  . ALPRAZolam (XANAX) 0.5 MG tablet Take 1 tablet (0.5 mg total) by mouth as needed for sleep. 30 tablet 5  . escitalopram (LEXAPRO) 20 MG tablet Take 20 mg by mouth daily.  1  . lamoTRIgine (LAMICTAL) 200 MG tablet Take 1 tablet (200 mg total) by mouth daily after supper. 30 tablet 5  . Multiple Vitamins-Minerals (MULTIVITAL) tablet Take 1 tablet by mouth daily. Gummy    . Naproxen Sodium (ALEVE PO) Take 2 tablets by mouth daily as needed (for pain).    . norgestimate-ethinyl estradiol (SPRINTEC 28) 0.25-35 MG-MCG tablet Take 1 tablet by mouth daily.    . traZODone (DESYREL) 100 MG tablet Take 200 mg by mouth at bedtime.     No current facility-administered medications for this visit.      Past Medical History:  Diagnosis Date  . ADHD (attention deficit hyperactivity disorder)   . Anxiety   . Depression   . Environmental allergies   . GERD (gastroesophageal reflux disease)   . Myopia of both eyes   . Obesity   . Syncope and collapse     ROS:   All systems reviewed and negative except as noted in the HPI.   No past surgical history on file.   Family History  Problem Relation Age of Onset  . Bipolar disorder Father   . Diabetes Father   . Hyperlipidemia  Father   . Bipolar disorder Brother   . Depression Mother   . Hyperlipidemia Mother   . Diabetes Maternal Grandmother   . Heart disease Maternal Grandmother   . Hyperlipidemia Maternal Grandmother   . Hypertension Maternal Grandmother   . Heart disease Maternal Grandfather   . Hyperlipidemia Maternal Grandfather   . Heart disease Paternal Grandmother   . Hypertension Paternal Grandmother   . Cancer Paternal Grandfather   . Diabetes Paternal Grandfather   . Bipolar disorder Paternal Aunt      Social History   Socioeconomic History  . Marital status: Single    Spouse name: Not on file  . Number of children: Not on file  . Years of education: Not on file  . Highest education level: Not on file  Social Needs  . Financial resource strain: Not on file  . Food insecurity - worry: Not on file  . Food insecurity - inability: Not on file  . Transportation needs - medical: Not on file  . Transportation needs - non-medical: Not on file  Occupational History  . Occupation: Consulting civil engineertudent  Tobacco Use  . Smoking status: Never Smoker  . Smokeless tobacco: Never Used  Substance and Sexual Activity  . Alcohol use: Yes    Alcohol/week: 0.6 oz    Types: 1 Glasses  of wine per week    Comment: Occasionaly  . Drug use: No  . Sexual activity: No    Birth control/protection: Pill  Other Topics Concern  . Not on file  Social History Narrative   07/05/12 AHW  [name] was born in [place], [place] and she currently lives with her mother, father, and her younger brother. She has an older brother who lives in [place]. [place]. She graduated high school. She is currently attending [employer] studying biology. She plans to return to [employer] to complete a degree in marine biology.  She is currently unemployed. 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  She denies any history of legal problems. She reports that her social support system consists of her therapist. She enjoys playing video games, bird watching,  cooking  sewing, white in nature documentaries, and hiking. 07/05/12 AHW      Regular exercise: no   Caffeine use: no     BP 122/64   Pulse 97   Ht 5\' 2"  (1.575 m)   Wt 196 lb (88.9 kg)   SpO2 96%   BMI 35.85 kg/m   Physical Exam:  Well appearing 27 yo woman, overweight but in NAD HEENT: Unremarkable Neck:  6 cm JVD, no thyromegally Lymphatics:  No adenopathy Back:  No CVA tenderness Lungs:  Clear with no wheezes HEART:  Regular rate rhythm, no murmurs, no rubs, no clicks Abd:  soft, obese, positive bowel sounds, no organomegally, no rebound, no guarding Ext:  2 plus pulses, no edema, no cyanosis, no clubbing Skin:  No rashes no nodules Neuro:  CN II through XII intact, motor grossly intact  EKG - nsr with sinus arrhythmia   Assess/Plan: 1. Autonomic dysfunction - she is still having some spells. I have discussed the mechanism of her symptoms with the patient and her mother. Because she is still symptomatic despite conservative therapy, we will start her on midodrine 5 bid.  2. Obesity - she is encouraged to increase her physical activity.  3. Palpitations - she has not had any symptomatic SVT. Will follow.  Leonia Reeves.D.

## 2017-03-04 NOTE — Patient Instructions (Addendum)
Medication Instructions:  Your physician has recommended you make the following change in your medication:  1.  Start taking midodrine 5 mg one tablet by mouth twice a day.  Labwork: None ordered.  Testing/Procedures: None ordered.  Follow-Up: Your physician wants you to follow-up in: 6 months with Dr. Ladona Ridgelaylor.   You will receive a reminder letter in the mail two months in advance. If you don't receive a letter, please call our office to schedule the follow-up appointment.  Any Other Special Instructions Will Be Listed Below (If Applicable).  Increase your salt intake.  If you need a refill on your cardiac medications before your next appointment, please call your pharmacy.  Midodrine tablets What is this medicine? MIDODRINE (MI doe dreen) is used to treat low blood pressure in patients who have symptoms like dizziness when going from a sitting to a standing position. This medicine may be used for other purposes; ask your health care provider or pharmacist if you have questions. COMMON BRAND NAME(S): Orvaten, ProAmatine What should I tell my health care provider before I take this medicine? They need to know if you have any of the following conditions: -difficulty passing urine -heart disease -high blood pressure -kidney disease -over active thyroid -pheochromocytoma -an unusual or allergic reaction to midodrine, other medicines, foods, dyes, or preservatives -pregnant or trying to get pregnant -breast-feeding How should I use this medicine? Take this medicine by mouth with a glass of water. Follow the directions on the prescription label. The last dose of this medicine should not be taken after the evening meal or less than 4 hours before bedtime. When you lie down for any length of time after taking this medicine, high blood pressure can occur. Do not take this medicine if you will be lying down for any length of time. Do not take your medicine more often than directed. Do not  stop taking except on your doctor's advice. Talk to your pediatrician regarding the use of this medicine in children. Special care may be needed. Overdosage: If you think you have taken too much of this medicine contact a poison control center or emergency room at once. NOTE: This medicine is only for you. Do not share this medicine with others. What if I miss a dose? If you miss a dose, take it as soon as you can. If it is almost time for your next dose, take only that dose. Do not take double or extra doses. What may interact with this medicine? Do not take this medicine with any of the following medications: -MAOIs like Carbex, Eldepryl, Marplan, Nardil, and Parnate -medicines called ergot alkaloids -medicines for colds and breathing difficulties or weight loss -procarbazine This medicine may also interact with the following medications: -cimetidine -digoxin -flecainide -fludrocortisone -metformin -procainamide -quinidine -ranitidine -triamterene -medicines called alpha-blockers like doxazosin, prazosin, and terazosin This list may not describe all possible interactions. Give your health care provider a list of all the medicines, herbs, non-prescription drugs, or dietary supplements you use. Also tell them if you smoke, drink alcohol, or use illegal drugs. Some items may interact with your medicine. What should I watch for while using this medicine? Visit your doctor or health care professional for regular checks on your progress. You may get drowsy or dizzy. Do not drive, use machinery, or do anything that needs mental alertness until you know how this medicine affects you. Do not stand or sit up quickly, especially if you are an older patient. This reduces the risk of dizzy  or fainting spells. Your mouth may get dry. Chewing sugarless gum or sucking hard candy, and drinking plenty of water may help. Contact your doctor if the problem does not go away or is severe. Do not treat  yourself for coughs, colds, or pain while you are taking this medicine without asking your doctor or health care professional for advice. Some ingredients may increase your blood pressure. What side effects may I notice from receiving this medicine? Side effects that you should report to your doctor or health care professional as soon as possible: -awareness of heart beating -blurred vision -headache -irregular heartbeat, palpitations, or chest pain -pounding in the ears -skin rash, hives Side effects that usually do not require medical attention (report to your doctor or health care professional if they continue or are bothersome): -change in heart rate -chills -goose bumps -increased need to urinate -itching -stomach pain -tingling in the skin or scalp This list may not describe all possible side effects. Call your doctor for medical advice about side effects. You may report side effects to FDA at 1-800-FDA-1088. Where should I keep my medicine? Keep out of the reach of children. Store at room temperature between 15 and 30 degrees C (59 and 86 degrees F). Throw away any unused medicine after the expiration date. NOTE: This sheet is a summary. It may not cover all possible information. If you have questions about this medicine, talk to your doctor, pharmacist, or health care provider.  2018 Elsevier/Gold Standard (2007-07-17 13:51:24)

## 2017-03-07 ENCOUNTER — Telehealth: Payer: Self-pay

## 2017-03-07 MED ORDER — MIDODRINE HCL 5 MG PO TABS: 5.0000 mg | ORAL_TABLET | Freq: Two times a day (BID) | ORAL | 3 refills | Status: DC

## 2017-03-07 MED FILL — MIDODRINE HCL 5 MG TABLET: 5 | 90 days supply | Qty: 180 | Fill #0

## 2017-03-07 NOTE — Telephone Encounter (Signed)
Order entered

## 2017-03-08 DIAGNOSIS — F331 Major depressive disorder, recurrent, moderate: Secondary | ICD-10-CM | POA: Diagnosis not present

## 2017-03-18 ENCOUNTER — Telehealth: Payer: Self-pay | Admitting: Internal Medicine

## 2017-03-18 NOTE — Telephone Encounter (Signed)
New message    Katie from Dr Zacher[facility]auerBarnes office calling to clarify social history note from  03/04/17 office note. Dr Zachery DauerBarnes wants to confirm cocaine was a part of the history.  Please call [phone]

## 2017-03-18 NOTE — Telephone Encounter (Signed)
After reviewing the chart in detail - it appears the information documented in the "social documentation" section by Jorje GuildAlan Watt, PA-C on 07/09/2012 maybe incorrect in the sentence reporting "she enjoys playing video games, but watching, cocaine, sewing, white in nature documentaries, and hiking." as the information doesn't seem to relate in the structure of a sentence.  Called and spoke with Florentina AddisonKatie at Dr [facility] office to make them aware that everywhere else in the chart no is no mention of drug use and in many places it is documented there is no illegal drug use.  Florentina AddisonKatie thanked me for the c/b and clarification.

## 2017-03-22 DIAGNOSIS — F331 Major depressive disorder, recurrent, moderate: Secondary | ICD-10-CM | POA: Diagnosis not present

## 2017-03-29 DIAGNOSIS — F331 Major depressive disorder, recurrent, moderate: Secondary | ICD-10-CM | POA: Diagnosis not present

## 2017-04-03 MED FILL — traZODone HCL 100 MG TABS: 100 | 90 days supply | Qty: 180 | Fill #1

## 2017-04-04 MED FILL — ESCITALOPRAM 20 MG TABLET: 20 | 90 days supply | Qty: 90 | Fill #1

## 2017-04-05 DIAGNOSIS — F331 Major depressive disorder, recurrent, moderate: Secondary | ICD-10-CM | POA: Diagnosis not present

## 2017-04-26 DIAGNOSIS — F331 Major depressive disorder, recurrent, moderate: Secondary | ICD-10-CM | POA: Diagnosis not present

## 2017-05-03 DIAGNOSIS — F331 Major depressive disorder, recurrent, moderate: Secondary | ICD-10-CM | POA: Diagnosis not present

## 2017-05-10 DIAGNOSIS — F331 Major depressive disorder, recurrent, moderate: Secondary | ICD-10-CM | POA: Diagnosis not present

## 2017-05-17 DIAGNOSIS — F331 Major depressive disorder, recurrent, moderate: Secondary | ICD-10-CM | POA: Diagnosis not present

## 2017-05-26 DIAGNOSIS — F331 Major depressive disorder, recurrent, moderate: Secondary | ICD-10-CM | POA: Diagnosis not present

## 2017-05-26 MED FILL — lamoTRIgine 200 MG TABS: 200 | 90 days supply | Qty: 90 | Fill #0

## 2017-05-31 DIAGNOSIS — F331 Major depressive disorder, recurrent, moderate: Secondary | ICD-10-CM | POA: Diagnosis not present

## 2017-06-13 DIAGNOSIS — F331 Major depressive disorder, recurrent, moderate: Secondary | ICD-10-CM | POA: Diagnosis not present

## 2017-06-23 DIAGNOSIS — F331 Major depressive disorder, recurrent, moderate: Secondary | ICD-10-CM | POA: Diagnosis not present

## 2017-06-30 DIAGNOSIS — F331 Major depressive disorder, recurrent, moderate: Secondary | ICD-10-CM | POA: Diagnosis not present

## 2017-07-01 DIAGNOSIS — H52223 Regular astigmatism, bilateral: Secondary | ICD-10-CM | POA: Diagnosis not present

## 2017-07-06 MED FILL — ESCITALOPRAM 20 MG TABLET: 20 | 30 days supply | Qty: 30 | Fill #0

## 2017-07-07 DIAGNOSIS — F331 Major depressive disorder, recurrent, moderate: Secondary | ICD-10-CM | POA: Diagnosis not present

## 2017-07-07 MED FILL — traZODone HCL 100 MG TABS: 100 | 90 days supply | Qty: 180 | Fill #0

## 2017-07-12 DIAGNOSIS — F331 Major depressive disorder, recurrent, moderate: Secondary | ICD-10-CM | POA: Diagnosis not present

## 2017-07-19 DIAGNOSIS — F331 Major depressive disorder, recurrent, moderate: Secondary | ICD-10-CM | POA: Diagnosis not present

## 2017-07-25 MED FILL — METHYLPHENIDATE 10 MG TAB: 10 | 14 days supply | Qty: 28 | Fill #0

## 2017-07-26 DIAGNOSIS — F331 Major depressive disorder, recurrent, moderate: Secondary | ICD-10-CM | POA: Diagnosis not present

## 2017-08-01 MED FILL — MIDODRINE HCL 5 MG TABLET: 5 | 90 days supply | Qty: 180 | Fill #1

## 2017-08-04 MED FILL — ESCITALOPRAM 20 MG TABLET: 20 | 90 days supply | Qty: 90 | Fill #0

## 2017-08-29 MED FILL — lamoTRIgine 200 MG TABS: 200 | 90 days supply | Qty: 90 | Fill #0

## 2017-10-24 MED FILL — traZODone HCL 100 MG TABS: 100 | 90 days supply | Qty: 180 | Fill #1

## 2017-10-30 MED FILL — ESCITALOPRAM 20 MG TABLET: 20 | 90 days supply | Qty: 90 | Fill #1

## 2017-10-31 MED FILL — ALPRAZolam 0.5 MG TABS: 0.5 | 30 days supply | Qty: 30 | Fill #0

## 2017-11-24 MED FILL — lamoTRIgine 200 MG TABS: 200 | 90 days supply | Qty: 90 | Fill #1

## 2017-12-18 ENCOUNTER — Ambulatory Visit: Payer: Self-pay | Admitting: Family Medicine

## 2017-12-18 VITALS — BP 95/65 | HR 89 | Temp 98.8°F | Resp 16 | Wt 201.4 lb

## 2017-12-18 DIAGNOSIS — J209 Acute bronchitis, unspecified: Secondary | ICD-10-CM

## 2017-12-18 MED ORDER — AZITHROMYCIN 250 MG PO TABS: ORAL_TABLET | ORAL | 0 refills | Status: DC

## 2017-12-18 NOTE — Progress Notes (Signed)
Patient ID: [name], female    DOB: April 10, 1990, 27 y.o.   MRN: [id number]  PCP: Juluis Rainier, MD  Chief Complaint  Patient presents with  . Cough    X 4 DAYS  . Hoarse    X 6 DAYS  . Sore Throat    X 6 DAYS     Subjective:  HPI [name] is a 27 y.o. female presents for evaluation of cough, sore throat, chest tightness, and shortness of breath. Symptoms initially began over 1 week ago and has progressively worsened. No history of asthma. Cough is productive of colored sputum. Reports noticing blood tinged sputum yesterday. Shortness of breath exacerbated by coughing. Fatigue present over the last few days. Exposure to sick contacts in home. Uncertain of fever. Denies night sweats, chills, wheezing, facial pressure, or ear pain. She has experienced a gradually worsening headache today. She has taken robitussin for cough and other over the counter cough and cold medications without significant improvement of symptoms. Social History   Socioeconomic History  . Marital status: Single    Spouse name: Not on file  . Number of children: Not on file  . Years of education: Not on file  . Highest education level: Not on file  Occupational History  . Occupation: Consulting civil engineer  Social Needs  . Financial resource strain: Not on file  . Food insecurity:    Worry: Not on file    Inability: Not on file  . Transportation needs:    Medical: Not on file    Non-medical: Not on file  Tobacco Use  . Smoking status: Never Smoker  . Smokeless tobacco: Never Used  Substance and Sexual Activity  . Alcohol use: Yes    Alcohol/week: 1.0 standard drinks    Types: 1 Glasses of wine per week    Comment: Occasionaly  . Drug use: No  . Sexual activity: Never    Birth control/protection: Pill  Lifestyle  . Physical activity:    Days per week: Not on file    Minutes per session: Not on file  . Stress: Not on file  Relationships  . Social connections:    Talks on phone: Not on file    Gets  together: Not on file    Attends religious service: Not on file    Active member of club or organization: Not on file    Attends meetings of clubs or organizations: Not on file    Relationship status: Not on file  . Intimate partner violence:    Fear of current or ex partner: Not on file    Emotionally abused: Not on file    Physically abused: Not on file    Forced sexual activity: Not on file  Other Topics Concern  . Not on file  Social History Narrative   07/05/12 AHW  [name] was born in [place], [place] and she currently lives with her mother, father, and her younger brother. She has an older brother who lives in [place]. [place]. She graduated high school. She is currently attending [employer] studying biology. She plans to return to [employer] to complete a degree in marine biology.  She is currently unemployed. 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  She denies any history of legal problems. She reports that her social support system consists of her therapist. She enjoys playing video games, but watching, cocaine, sewing, white in nature documentaries, and hiking. 07/05/12 AHW      Regular exercise: no   Caffeine  use: no    Family History  Problem Relation Age of Onset  . Bipolar disorder Father   . Diabetes Father   . Hyperlipidemia Father   . Bipolar disorder Brother   . Depression Mother   . Hyperlipidemia Mother   . Diabetes Maternal Grandmother   . Heart disease Maternal Grandmother   . Hyperlipidemia Maternal Grandmother   . Hypertension Maternal Grandmother   . Heart disease Maternal Grandfather   . Hyperlipidemia Maternal Grandfather   . Heart disease Paternal Grandmother   . Hypertension Paternal Grandmother   . Cancer Paternal Grandfather   . Diabetes Paternal Grandfather   . Bipolar disorder Paternal Aunt    Review of Systems  Pertinent negatives listed in HPI Patient Active Problem List   Diagnosis Date Noted  . Major depressive disorder, recurrent episode,  severe, without mention of psychotic behavior 02/14/2013  . Overweight(278.02) 01/26/2013    Allergies  Allergen Reactions  . Cymbalta [Duloxetine Hcl] Other (See Comments)    Hypotension  . Daytrana [Methylphenidate] Rash    Like a sunburn  . Duloxetine Other (See Comments)    Fainting    Prior to Admission medications   Medication Sig Start Date End Date Taking? Authorizing Provider  ALPRAZolam Prudy Feeler) 0.5 MG tablet Take 1 tablet (0.5 mg total) by mouth as needed for sleep. 11/12/15  Yes Plovsky, Earvin Hansen, MD  escitalopram (LEXAPRO) 20 MG tablet Take 20 mg by mouth daily. 01/03/17  Yes [provider]  lamoTRIgine (LAMICTAL) 200 MG tablet Take 1 tablet (200 mg total) by mouth daily after supper. 11/12/15  Yes Plovsky, Earvin Hansen, MD  loratadine (CLARITIN) 10 MG tablet Take 10 mg by mouth daily.   Yes [provider]  midodrine (PROAMATINE) 5 MG tablet Take 1 tablet (5 mg total) by mouth 2 (two) times daily with a meal. 03/07/17  Yes Marinus Maw, MD  Multiple Vitamins-Minerals (MULTIVITAMIN GUMMIES ADULT) CHEW Chew by mouth.   Yes [provider]  traZODone (DESYREL) 100 MG tablet Take 200 mg by mouth at bedtime.   Yes [provider]  azithromycin (ZITHROMAX) 250 MG tablet Take 2 tabs PO x 1 dose, then 1 tab PO QD x 4 days 12/18/17   Bing Neighbors, FNP  Multiple Vitamins-Minerals (MULTIVITAL) tablet Take 1 tablet by mouth daily. Gummy    [provider]  Naproxen Sodium (ALEVE PO) Take 2 tablets by mouth daily as needed (for pain).    [provider]  norgestimate-ethinyl estradiol (SPRINTEC 28) 0.25-35 MG-MCG tablet Take 1 tablet by mouth daily.    [provider]    Past Medical, Surgical Family and Social History reviewed and updated.    Objective:   Today's Vitals   12/18/17 1456  BP: 95/65  Pulse: 89  Resp: 16  Temp: 98.8 F (37.1 C)  TempSrc: Oral  SpO2: 97%  Weight: 201 lb 6.4 oz (91.4 kg)    Wt  Readings from Last 3 Encounters:  12/18/17 201 lb 6.4 oz (91.4 kg)  03/04/17 196 lb (88.9 kg)  05/13/16 186 lb 12.8 oz (84.7 kg)   Physical Exam  Constitutional: She is oriented to person, place, and time. She appears ill.  HENT:  Mouth/Throat: Uvula is midline and oropharynx is clear and moist.  Cardiovascular: Normal rate, regular rhythm and normal heart sounds.  Pulmonary/Chest: She has no wheezes. She has rhonchi.  Hacking rhonchi-type cough observed during exam. Breath sound diminished throughout (possible secondary to obesity)  Increased respiratory effort  Lymphadenopathy:    She has no cervical adenopathy.  Neurological: She is alert and oriented to person, place, and time.  Skin: Skin is warm and dry. Capillary refill takes less than 2 seconds.  Psychiatric: She has a normal mood and affect. Her behavior is normal.   Assessment & Plan:  1. Acute bronchitis, unspecified organism, likely the underlying cause of symptoms.  -Patient has albuterol inhaler at home, prescribed 2 puffs every 4-6 hours as needed for shortness of breath, chest tightness, and cyclic coughing -will treat empirically with broad-spectrum antibiotic, Start Azithromycin Take 2 tabs x 1 dose, then 1 tab every day for x 4 days -Patient preference to continue Robitussin for cough -  If no improvement adding prednisone would be reasonable, however will hold off for now given patient obesity and hx of prediabetes.  Strict follow-up precautions recommended. Patient verbalized agreement    If symptoms worsen or do not improve, return for follow-up, follow-up with PCP, or at the emergency department if severity of symptoms warrant a higher level of care.  Godfrey PickKimberly S. Tiburcio PeaHarris, MSN, FNP-C [id number]  [place], [place] [id number] [phone]

## 2017-12-18 NOTE — Patient Instructions (Signed)
I recommend albuterol 2 puffs every 4 hours as needed for persistent cough and shortness of breath  ( use albuterol you have at home unless out of date.) Continue robitussin for cough as directed. Start Azithromycin Take 2 tabs x 1 dose, then 1 tab every day for x 4 days   Acute Bronchitis, Adult Acute bronchitis is when air tubes (bronchi) in the lungs suddenly get swollen. The condition can make it hard to breathe. It can also cause these symptoms:  A cough.  Coughing up clear, yellow, or green mucus.  Wheezing.  Chest congestion.  Shortness of breath.  A fever.  Body aches.  Chills.  A sore throat.  Follow these instructions at home: Medicines  Take over-the-counter and prescription medicines only as told by your doctor.  If you were prescribed an antibiotic medicine, take it as told by your doctor. Do not stop taking the antibiotic even if you start to feel better. General instructions  Rest.  Drink enough fluids to keep your pee (urine) clear or pale yellow.  Avoid smoking and secondhand smoke. If you smoke and you need help quitting, ask your doctor. Quitting will help your lungs heal faster.  Use an inhaler, cool mist vaporizer, or humidifier as told by your doctor.  Keep all follow-up visits as told by your doctor. This is important. How is this prevented? To lower your risk of getting this condition again:  Wash your hands often with soap and water. If you cannot use soap and water, use hand sanitizer.  Avoid contact with people who have cold symptoms.  Try not to touch your hands to your mouth, nose, or eyes.  Make sure to get the flu shot every year.  Contact a doctor if:  Your symptoms do not get better in 2 weeks. Get help right away if:  You cough up blood.  You have chest pain.  You have very bad shortness of breath.  You become dehydrated.  You faint (pass out) or keep feeling like you are going to pass out.  You keep throwing up  (vomiting).  You have a very bad headache.  Your fever or chills gets worse. This information is not intended to replace advice given to you by your health care provider. Make sure you discuss any questions you have with your health care provider. Document Released: 06/16/2007 Document Revised: 08/06/2015 Document Reviewed: 06/18/2015 Elsevier Interactive Patient Education  [employer]2018 Elsevier Inc.

## 2018-01-13 ENCOUNTER — Telehealth: Payer: Self-pay | Admitting: Psychiatry

## 2018-01-14 ENCOUNTER — Encounter: Payer: Self-pay | Admitting: Psychiatry

## 2018-01-16 NOTE — Telephone Encounter (Signed)
Pt requesting letter that she is being treated here. Letter provided.

## 2018-01-19 ENCOUNTER — Telehealth: Payer: Self-pay | Admitting: Internal Medicine

## 2018-01-19 NOTE — Telephone Encounter (Signed)
New message   Patient states that she states needs a note describing of medical condition and what she is being prescribed. Please advise.

## 2018-01-20 NOTE — Telephone Encounter (Signed)
Returned call to Pt.  Per Pt she does not need any further assistance.

## 2018-01-23 MED FILL — traZODone HCL 100 MG TABS: 100 | 90 days supply | Qty: 180 | Fill #0

## 2018-01-23 MED FILL — ESCITALOPRAM 20 MG TABLET: 20 | 90 days supply | Qty: 90 | Fill #0

## 2018-02-10 ENCOUNTER — Encounter: Payer: Self-pay | Admitting: Emergency Medicine

## 2018-02-28 MED FILL — lamoTRIgine 200 MG TABS: 200 | 90 days supply | Qty: 90 | Fill #0

## 2018-03-29 ENCOUNTER — Other Ambulatory Visit: Payer: Self-pay | Admitting: Internal Medicine

## 2018-04-25 MED FILL — traZODone HCL 100 MG TABS: 100 | 90 days supply | Qty: 180 | Fill #1

## 2018-04-25 MED FILL — ESCITALOPRAM 20 MG TABLET: 20 | 90 days supply | Qty: 90 | Fill #1

## 2018-05-12 ENCOUNTER — Other Ambulatory Visit: Payer: Self-pay

## 2018-05-12 ENCOUNTER — Encounter: Payer: Self-pay | Admitting: Psychiatry

## 2018-05-12 ENCOUNTER — Ambulatory Visit (INDEPENDENT_AMBULATORY_CARE_PROVIDER_SITE_OTHER): Payer: Self-pay | Admitting: Psychiatry

## 2018-05-12 DIAGNOSIS — F3342 Major depressive disorder, recurrent, in full remission: Secondary | ICD-10-CM

## 2018-05-12 DIAGNOSIS — F5101 Primary insomnia: Secondary | ICD-10-CM

## 2018-05-12 MED ORDER — LAMOTRIGINE 200 MG PO TABS: 200.0000 mg | ORAL_TABLET | Freq: Every day | ORAL | 1 refills | Status: DC

## 2018-05-12 MED ORDER — TRAZODONE HCL 100 MG PO TABS: 200.0000 mg | ORAL_TABLET | Freq: Every day | ORAL | 1 refills | Status: DC

## 2018-05-12 MED ORDER — ESCITALOPRAM OXALATE 20 MG PO TABS: 20.0000 mg | ORAL_TABLET | Freq: Every day | ORAL | 1 refills | Status: DC

## 2018-05-12 MED FILL — SUBVENITE 200 MG TABS: 200 | 90 days supply | Qty: 90 | Fill #0

## 2018-05-12 NOTE — Progress Notes (Signed)
Mary HaymakerMeghan C Mikulski [id number] 03/03/1990 27 y.o.  Virtual Visit via Telephone Note  I connected with@ on 05/12/18 at  9:00 AM EDT by telephone and verified that I am speaking with the correct person using two identifiers.   I discussed the limitations, risks, security and privacy concerns of performing an evaluation and management service by telephone and the availability of in person appointments. I also discussed with the patient that there may be a patient responsible charge related to this service. The patient expressed understanding and agreed to proceed.   I discussed the assessment and treatment plan with the patient. The patient was provided an opportunity to ask questions and all were answered. The patient agreed with the plan and demonstrated an understanding of the instructions.   The patient was advised to call back or seek an in-person evaluation if the symptoms worsen or if the condition fails to improve as anticipated.  I provided 30 minutes of non-face-to-face time during this encounter.  The patient was located at home.  The provider was located at home.   Corie ChiquitoJessica Aalayah Riles, PMHNP   Subjective:   Patient ID:  [name] is a 28 y.o. (DOB 06/04/1990) female.  Chief Complaint:  Chief Complaint  Patient presents with  . Follow-up    h/o anxiety, depression, and sleep disturbance    HPI [name] presents for follow-up of anxiety, depression, and insomnia. She reports that her anxiety has been "fine." She reports that anxiety is rare. She reports that her mood has been "very good." Describes her sleep as "spotty" and has chronic sleep issues. She reports sleeping 12 hours in a 24 hour period if undisturbed. Denies low energy and low motivation. She reports adequate concentration. Denies app disturbance. She reports that she has gained some weight since the pandemic began and attributes this to decreased activity. Denies SI.  Reports that she just completed the semester.    Reports that she uses Xanax prn about once a month  Review of Systems:  Review of Systems  Cardiovascular:       Reports that she continues to have some fluctuations in BP.   Gastrointestinal: Negative.   Musculoskeletal: Negative for gait problem.  Allergic/Immunologic: Positive for environmental allergies.  Neurological: Negative for tremors.  Psychiatric/Behavioral:       Please refer to HPI    Medications: I have reviewed the patient's current medications.  Current Outpatient Medications  Medication Sig Dispense Refill  . ALPRAZolam (XANAX) 0.5 MG tablet Take 1 tablet (0.5 mg total) by mouth as needed for sleep. 30 tablet 5  . cetirizine (ZYRTEC) 10 MG tablet Take 10 mg by mouth daily.    Marland Kitchen. escitalopram (LEXAPRO) 20 MG tablet Take 1 tablet (20 mg total) by mouth daily. 90 tablet 1  . lamoTRIgine (LAMICTAL) 200 MG tablet Take 1 tablet (200 mg total) by mouth daily after supper. 90 tablet 1  . midodrine (PROAMATINE) 5 MG tablet Take 1 tablet (5 mg total) by mouth 2 (two) times daily with a meal. Please make annual appt for future refills. Thank you 180 tablet 0  . Multiple Vitamins-Minerals (MULTIVITAL) tablet Take 1 tablet by mouth daily. Gummy    . Multiple Vitamins-Minerals (MULTIVITAMIN GUMMIES ADULT) CHEW Chew by mouth.    . Naproxen Sodium (ALEVE PO) Take 2 tablets by mouth daily as needed (for pain).    . traZODone (DESYREL) 100 MG tablet Take 2 tablets (200 mg total) by mouth at bedtime. 180 tablet 1  .  azithromycin (ZITHROMAX) 250 MG tablet Take 2 tabs PO x 1 dose, then 1 tab PO QD x 4 days (Patient not taking: Reported on 05/12/2018) 6 tablet 0   No current facility-administered medications for this visit.     Medication Side Effects: None  Allergies:  Allergies  Allergen Reactions  . Cymbalta [Duloxetine Hcl] Other (See Comments)    Hypotension  . Daytrana [Methylphenidate] Rash    Like a sunburn  . Duloxetine Other (See Comments)    Fainting    Past  Medical History:  Diagnosis Date  . ADHD (attention deficit hyperactivity disorder)   . Anxiety   . Depression   . Environmental allergies   . GERD (gastroesophageal reflux disease)   . Myopia of both eyes   . Obesity   . Syncope and collapse     Family History  Problem Relation Age of Onset  . Bipolar disorder Father   . Diabetes Father   . Hyperlipidemia Father   . Bipolar disorder Brother   . Depression Mother   . Hyperlipidemia Mother   . Diabetes Maternal Grandmother   . Heart disease Maternal Grandmother   . Hyperlipidemia Maternal Grandmother   . Hypertension Maternal Grandmother   . Heart disease Maternal Grandfather   . Hyperlipidemia Maternal Grandfather   . Heart disease Paternal Grandmother   . Hypertension Paternal Grandmother   . Cancer Paternal Grandfather   . Diabetes Paternal Grandfather   . Bipolar disorder Paternal Aunt     Social History   Socioeconomic History  . Marital status: Single    Spouse name: Not on file  . Number of children: Not on file  . Years of education: Not on file  . Highest education level: Not on file  Occupational History  . Occupation: Consulting civil engineer  Social Needs  . Financial resource strain: Not on file  . Food insecurity:    Worry: Not on file    Inability: Not on file  . Transportation needs:    Medical: Not on file    Non-medical: Not on file  Tobacco Use  . Smoking status: Never Smoker  . Smokeless tobacco: Never Used  Substance and Sexual Activity  . Alcohol use: Yes    Alcohol/week: 1.0 standard drinks    Types: 1 Glasses of wine per week    Comment: Occasionaly  . Drug use: No  . Sexual activity: Never    Birth control/protection: Pill  Lifestyle  . Physical activity:    Days per week: Not on file    Minutes per session: Not on file  . Stress: Not on file  Relationships  . Social connections:    Talks on phone: Not on file    Gets together: Not on file    Attends religious service: Not on file     Active member of club or organization: Not on file    Attends meetings of clubs or organizations: Not on file    Relationship status: Not on file  . Intimate partner violence:    Fear of current or ex partner: Not on file    Emotionally abused: Not on file    Physically abused: Not on file    Forced sexual activity: Not on file  Other Topics Concern  . Not on file  Social History Narrative   07/05/12 AHW  Mayven was born in [place], [place] and she currently lives with her mother, father, and her younger brother. She has an older brother who lives in [place].  [place]. She graduated high school. She is currently attending [employer] studying biology. She plans to return to [employer] to complete a degree in marine biology.  She is currently unemployed. 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  She denies any history of legal problems. She reports that her social support system consists of her therapist. She enjoys playing video games, but watching, cocaine, sewing, white in nature documentaries, and hiking. 07/05/12 AHW      Regular exercise: no   Caffeine use: no    Past Medical History, Surgical history, Social history, and Family history were reviewed and updated as appropriate.   Please see review of systems for further details on the patient's review from today.   Objective:   Physical Exam:  There were no vitals taken for this visit.  Physical Exam Neurological:     Mental Status: She is alert and oriented to person, place, and time.     Cranial Nerves: No dysarthria.  Psychiatric:        Attention and Perception: Attention normal.        Mood and Affect: Mood normal.        Speech: Speech normal.        Behavior: Behavior is cooperative.        Thought Content: Thought content normal. Thought content is not paranoid or delusional. Thought content does not include homicidal or suicidal ideation. Thought content does not include homicidal or suicidal plan.        Cognition and  Memory: Cognition and memory normal.        Judgment: Judgment normal.     Lab Review:  No results found for: NA, K, CL, CO2, GLUCOSE, BUN, CREATININE, CALCIUM, PROT, ALBUMIN, AST, ALT, ALKPHOS, BILITOT, GFRNONAA, GFRAA  No results found for: WBC, RBC, HGB, HCT, PLT, MCV, MCH, MCHC, RDW, LYMPHSABS, MONOABS, EOSABS, BASOSABS  No results found for: POCLITH, LITHIUM   No results found for: PHENYTOIN, PHENOBARB, VALPROATE, CBMZ   .res Assessment: Plan:   Continue Lexapro 20 mg daily for anxiety and depression. Continue Lamictal 200 mg daily for mood stabilization. Continue trazodone 200 mg at bedtime for insomnia. Continue alprazolam 0.5 mg daily as needed for anxiety.  Patient reports that she has been rarely using alprazolam and does not need a refill at this time.  Patient encouraged to contact office if she needs a refill on alprazolam prior to next visit. Patient to follow-up in 6 months or sooner if clinically indicated. Patient advised to contact office with any questions, adverse effects, or acute worsening in signs and symptoms.  Recurrent major depressive disorder, in full remission (HCC) - Plan: escitalopram (LEXAPRO) 20 MG tablet, lamoTRIgine (LAMICTAL) 200 MG tablet  Primary insomnia - Plan: traZODone (DESYREL) 100 MG tablet  Please see After Visit Summary for patient specific instructions.  Future Appointments  Date Time Provider Department Center  11/13/2018  2:45 PM [name], PMHNP [facility] None    No orders of the defined types were placed in this encounter.     -------------------------------

## 2018-06-02 ENCOUNTER — Telehealth (INDEPENDENT_AMBULATORY_CARE_PROVIDER_SITE_OTHER): Payer: Self-pay | Admitting: Nurse Practitioner

## 2018-06-02 ENCOUNTER — Encounter (INDEPENDENT_AMBULATORY_CARE_PROVIDER_SITE_OTHER): Payer: Self-pay

## 2018-06-02 DIAGNOSIS — Z20822 Contact with and (suspected) exposure to covid-19: Secondary | ICD-10-CM

## 2018-06-02 MED ORDER — BENZONATATE 100 MG PO CAPS: 100.0000 mg | ORAL_CAPSULE | Freq: Three times a day (TID) | ORAL | 0 refills | Status: DC | PRN

## 2018-06-02 NOTE — Addendum Note (Signed)
Addended by: Bennie Pierini on: 06/02/2018 10:06 AM   Modules accepted: Orders

## 2018-06-02 NOTE — Progress Notes (Signed)

## 2018-06-03 ENCOUNTER — Telehealth: Payer: Self-pay

## 2018-06-03 ENCOUNTER — Encounter (INDEPENDENT_AMBULATORY_CARE_PROVIDER_SITE_OTHER): Payer: Self-pay

## 2018-06-03 MED FILL — BENZONATATE 100 MG CAPS: 100 | 7 days supply | Qty: 20 | Fill #0

## 2018-06-03 NOTE — Telephone Encounter (Signed)
PEC Courtesy Call to patient -   Patient reports her cough has gotten more frequent - she has begun taking Tessalon Perles as prescribed for relief; weakness and appetite is worse due to just not feeling like eating and laying around because of not feeling we mg ll. Patient denies: SOB; diarrhea; fever( reported 98.9-100 as highest w/no meds on board)  - advised patient she could take OTC Tylenol ES 500 mg x 2 every 6-8 hours PRN for fever, bodyaches - max 4,000 mg in a day(24 hours). Patient stated she understood and had no questions at this time.  Call was reviewed with provider as well.

## 2018-06-04 ENCOUNTER — Encounter (INDEPENDENT_AMBULATORY_CARE_PROVIDER_SITE_OTHER): Payer: Self-pay

## 2018-06-05 ENCOUNTER — Telehealth: Payer: Self-pay

## 2018-06-05 ENCOUNTER — Encounter (INDEPENDENT_AMBULATORY_CARE_PROVIDER_SITE_OTHER): Payer: Self-pay

## 2018-06-05 NOTE — Telephone Encounter (Signed)
Pt called pt, she had filled out her my chart covid monitoring. She notice this morning that when she got to coughing that she couldn't get her breath, she said it was almost like a panic attack  She said that this hasn't happen again since this morning,  I advise her to  continue to monitor at home   If symptoms become severe, i.e. shortness of breath at rest, gasping for air, wheezing, CALL 911 AND SEEK TREATMENT IN THE ED .

## 2018-06-07 ENCOUNTER — Telehealth: Payer: Self-pay

## 2018-06-07 ENCOUNTER — Encounter (INDEPENDENT_AMBULATORY_CARE_PROVIDER_SITE_OTHER): Payer: Self-pay

## 2018-06-07 NOTE — Telephone Encounter (Signed)
Tried to call pt about my chart question for covid , will try calling pt back .

## 2018-06-07 NOTE — Telephone Encounter (Signed)
Sent a my chart message to pt about her coughing , she stated on my chart covid question that her cough had gotten worse. I advise her to try otc cough medication ,Hard candy or cough drops and drinking warm fluids. also use honey 2 tsp (10 ML) at bedtime.  If cough is becoming worse even with the use of over the counter medications and  patient is not able to sleep at night, cough becomes productive with sputum that maybe yellow or green in color, contact PCP.

## 2018-06-08 ENCOUNTER — Encounter (INDEPENDENT_AMBULATORY_CARE_PROVIDER_SITE_OTHER): Payer: Self-pay

## 2018-06-09 ENCOUNTER — Telehealth: Payer: Self-pay

## 2018-06-09 NOTE — Telephone Encounter (Signed)
From patients COVID-19 home monitoring questions.  Patient said that her appetite is worse and diarrhea is new.   I called patient and spoke with her about her appetite she stated that she has been depressed and she thinks that's why her appetite has been worse.   She also said that the diarrhea she is experiencing is coming from something she ate. Advised patient to drink as much fluids as possible and also try to each some crackers or soup.

## 2018-06-11 ENCOUNTER — Telehealth: Payer: Self-pay

## 2018-06-11 ENCOUNTER — Encounter (INDEPENDENT_AMBULATORY_CARE_PROVIDER_SITE_OTHER): Payer: Self-pay

## 2018-06-11 NOTE — Telephone Encounter (Signed)
PEC Courtesy Call  Patient reports cough was better yesterday - no coughing, went out today, did to much - feeling weakness and coughing has started back up. Patient reports she is taking the [employer] as prescribed - reports she has had cough since October 2019. Advised patient to continue [employer] - reiterated she can try hard candy, Halls cough drops, sipping on warm tea with honey, getting adequate rest and staying hydrated. Advised to contact PCP on Monday if cough is no better. Patient she understood and had no other concerns at this time.

## 2018-06-12 ENCOUNTER — Telehealth: Payer: Self-pay | Admitting: Family

## 2018-06-12 ENCOUNTER — Encounter (INDEPENDENT_AMBULATORY_CARE_PROVIDER_SITE_OTHER): Payer: Self-pay

## 2018-06-12 NOTE — Telephone Encounter (Signed)
close

## 2018-06-12 NOTE — Telephone Encounter (Signed)
Marcelino Duster stated patient c/o increased cough.   started tessalon perles today. Decreased appetite. Drinking fluids.  No sob, vomiting, syncope.   Agree with Michelle's advise which I conveyed when she called me.

## 2018-06-13 ENCOUNTER — Telehealth: Payer: Self-pay

## 2018-06-13 ENCOUNTER — Encounter (INDEPENDENT_AMBULATORY_CARE_PROVIDER_SITE_OTHER): Payer: Self-pay

## 2018-06-13 NOTE — Telephone Encounter (Signed)
Pt completed covid question , pt stated that she was new or worsening of diaherra advised pt to  If diarrhea remains the same: encourage patient to drink oral fluids and bland foods.   Avoid alcohol, spicy foods, caffeine or fatty foods that could make diarrhea worse.   Continue to monitor for signs of dehydration (increased thirst decreased urine output, yellow urine, dry skin, headache or dizziness).   Advise patient to try OTC medication (Imodium, kaopectate, Pepto-Bismol) as per manufacturer's instructions. If worsening diarrhea occurs and becomes severe (6-7 bowel movements a day): notify PCP   If diarrhea last greater than 7 days: notify PCP   IF SIGNS OF DEHYDRATION OCCUR (INCREASED THIRST, DECREASED URINE OUTPUT, YELLOW URINE, DRY SKIN, HEADACHE OR DIZZINESS) ADVISE PATIENT TO CALL 911 AND SEEK TREATMENT IN THE ED Sent pt a mychart message informing pt of this.

## 2018-06-14 ENCOUNTER — Encounter (INDEPENDENT_AMBULATORY_CARE_PROVIDER_SITE_OTHER): Payer: Self-pay

## 2018-06-15 ENCOUNTER — Encounter (INDEPENDENT_AMBULATORY_CARE_PROVIDER_SITE_OTHER): Payer: Self-pay

## 2018-06-16 ENCOUNTER — Encounter (INDEPENDENT_AMBULATORY_CARE_PROVIDER_SITE_OTHER): Payer: Self-pay

## 2018-06-28 ENCOUNTER — Other Ambulatory Visit: Payer: Self-pay

## 2018-06-28 ENCOUNTER — Ambulatory Visit (INDEPENDENT_AMBULATORY_CARE_PROVIDER_SITE_OTHER): Payer: Self-pay | Admitting: Psychiatry

## 2018-06-28 DIAGNOSIS — F3342 Major depressive disorder, recurrent, in full remission: Secondary | ICD-10-CM

## 2018-06-28 NOTE — Progress Notes (Signed)
[facility] Counselor/Therapist Progress Note  Patient ID: [name], MRN: [id number],    Date: 06/28/2018  Time Spent: 50 minutes start time 4:02 PM  End time 4:52 PM Virtual Visit via Telephone Note Connected with patient by a video enabled telemedicine/telehealth application or telephone, with their informed consent, and verified patient privacy and that I am speaking with the correct person using two identifiers. I discussed the limitations, risks, security and privacy concerns of performing psychotherapy and management service by telephone and the availability of in person appointments. I also discussed with the patient that there may be a patient responsible charge related to this service. The patient expressed understanding and agreed to proceed. I discussed the treatment planning with the patient. The patient was provided an opportunity to ask questions and all were answered. The patient agreed with the plan and demonstrated an understanding of the instructions. The patient was advised to call  our office if  symptoms worsen or feel they are in a crisis state and need immediate contact.   Therapist Location: office Patient Location: home    Treatment Type: Individual Therapy  Reported Symptoms: anxiety, depression, sleep issues, panic   Mental Status Exam:  Appearance:   Casual     Behavior:  Appropriate  Motor:  Normal  Speech/Language:   Normal Rate  Affect:  Congruent  Mood:  sad  Thought process:  normal  Thought content:    Rumination  Sensory/Perceptual disturbances:    WNL  Orientation:  oriented to person, place, time/date and situation  Attention:  Good  Concentration:  Good  Memory:  WNL  Fund of knowledge:   Fair  Insight:    Fair  Judgment:   Good  Impulse Control:  Good   Risk Assessment: Danger to Self:  No Self-injurious Behavior: No Danger to Others: No Duty to Warn:no Physical Aggression / Violence:No  Access to Firearms a concern: No   Gang Involvement:No   Subjective: Met with patient via virtual session through webex.  Patient explained she was having a hard time with the entire situation.  Patient explained she feels that the quarantine has just gotten to your and she started getting depressed so she called.  Patient went on to explain that she had been dating someone and that was helping things to go in a better direction.  Recently because of the quarantine he has not been able to see her.  Discussed the fact that she has to remind herself that this is a temporary situation and that she will get to the other side of it.  Different times when things start moving in a more positive direction she was encouraged to write down those progression so her brain knows it will get better soon.  Was also encouraged to try and find different ways to engage her brain and activity so she does not have a lot of time to think about the things that may be going on wrong.  She was also encouraged to try and get on some form a schedule so that her days and nights do not get out of whack.  Interventions: Cognitive Behavioral Therapy and Solution-Oriented/Positive Psychology  Diagnosis:No diagnosis found.  Plan: 1.  Patient to continue to engage in individual counseling 2-4 times a month or as needed. 2.  Patient to identify and apply CBT, coping skills learned in session to decrease depression symptoms. 3.  Patient to contact this office, go to the local ED or call 911 if  a crisis or emergency develops between visits.  [name], [facility]  This record has been created using [employer].  Chart creation errors have been sought, but may not always have been located and corrected. Such creation errors do not reflect on the standard of medical care.

## 2018-06-29 ENCOUNTER — Encounter: Payer: Self-pay | Admitting: Psychiatry

## 2018-07-12 DIAGNOSIS — R05 Cough: Secondary | ICD-10-CM | POA: Diagnosis not present

## 2018-07-12 DIAGNOSIS — M542 Cervicalgia: Secondary | ICD-10-CM | POA: Diagnosis not present

## 2018-07-17 ENCOUNTER — Other Ambulatory Visit: Payer: Self-pay

## 2018-07-17 ENCOUNTER — Ambulatory Visit (INDEPENDENT_AMBULATORY_CARE_PROVIDER_SITE_OTHER): Payer: Self-pay | Admitting: Psychiatry

## 2018-07-17 DIAGNOSIS — F3342 Major depressive disorder, recurrent, in full remission: Secondary | ICD-10-CM

## 2018-07-17 NOTE — Progress Notes (Signed)
[facility] Counselor/Therapist Progress Note  Patient ID: [name], MRN: [id number],    Date: 07/17/2018  Time Spent: 48 minutes start time 4:17 PM end time 5:05 PM Virtual Visit via Telephone Note Connected with patient by a video enabled telemedicine/telehealth application or telephone, with their informed consent, and verified patient privacy and that I am speaking with the correct person using two identifiers. I discussed the limitations, risks, security and privacy concerns of performing psychotherapy and management service by telephone and the availability of in person appointments. I also discussed with the patient that there may be a patient responsible charge related to this service. The patient expressed understanding and agreed to proceed. I discussed the treatment planning with the patient. The patient was provided an opportunity to ask questions and all were answered. The patient agreed with the plan and demonstrated an understanding of the instructions. The patient was advised to call  our office if  symptoms worsen or feel they are in a crisis state and need immediate contact.   Therapist Location: office Patient Location: home    Treatment Type: Individual Therapy  Reported Symptoms: depressed, sad  Mental Status Exam:  Appearance:   Casual     Behavior:  Appropriate  Motor:  Normal  Speech/Language:   Normal Rate  Affect:  Tearful  Mood:  sad  Thought process:  circumstantial  Thought content:    Rumination  Sensory/Perceptual disturbances:    WNL  Orientation:  oriented to person, place, time/date and situation  Attention:  Good  Concentration:  Good  Memory:  WNL  Fund of knowledge:   Fair  Insight:    Fair  Judgment:   Fair  Impulse Control:  Good   Risk Assessment: Danger to Self:  No Self-injurious Behavior: No Danger to Others: No Duty to Warn:no Physical Aggression / Violence:No  Access to Firearms a concern: No  Gang Involvement:No    Subjective: Met with patient via virtual session through WebEx.  Patient reported she was in [place] with her boyfriend.  She shared she was very upset because she did not feel like he did much for her birthday.  He had gotten her birthday present but was not spending much time with her and she was starting to feel very isolated and alone.  Also shared she was upset because she is having car trouble and she does not have the funds to get things fixed and taking care of.  She shared that her mother is not doing well financially either.  She went on to report that her brother had possible job for her with a [employer].  She reported with a coronavirus she just did not feel that she could put herself in danger by working with others who may not be wearing masks and working with the public.  Patient reported her anxiety is just too high to put herself in that position and she is just not ready to risk being exposed.  Patient was encouraged to start thinking about different job opportunities that would not give her as many opportunities to be exposed to the virus but will allow her to feel she can work more independently away from others.  Discussed different possibilities with patient and encouraged her to start getting out of the house and seeking options that might lead her to having a different situation.  Patient was encouraged she has to affirm herself and to let herself know that she can do it and that it is  a healthy thing for her.  Different strategies to help her do that were discussed with patient and plans were developed.  Interventions: Cognitive Behavioral Therapy and Solution-Oriented/Positive Psychology  Diagnosis:   ICD-10-CM   1. Recurrent major depressive disorder, in full remission ([place])  F33.42     Plan: 1.  Patient to continue to engage in individual counseling 2-4 times a month or as needed. 2.  Patient to identify and apply CBT, coping skills learned in session to decrease depression  and anxiety symptoms. 3.  Patient to contact this office, go to the local ED or call 911 if a crisis or emergency develops between visits.  [name], [facility]  This record has been created using [employer].  Chart creation errors have been sought, but may not always have been located and corrected. Such creation errors do not reflect on the standard of medical care.

## 2018-07-18 ENCOUNTER — Encounter: Payer: Self-pay | Admitting: Psychiatry

## 2018-07-31 MED FILL — ESCITALOPRAM 20 MG TABLET: 20 | 90 days supply | Qty: 90 | Fill #2

## 2018-08-20 ENCOUNTER — Other Ambulatory Visit: Payer: Self-pay | Admitting: Internal Medicine

## 2018-08-23 ENCOUNTER — Other Ambulatory Visit: Payer: Self-pay | Admitting: Internal Medicine

## 2018-08-23 MED ORDER — MIDODRINE HCL 5 MG PO TABS: 5.0000 mg | ORAL_TABLET | Freq: Two times a day (BID) | ORAL | 0 refills | Status: DC

## 2018-08-23 NOTE — Telephone Encounter (Signed)
Pt's medication was sent to pt's pharmacy as requested. Confirmation received.  °

## 2018-08-30 MED FILL — traZODone HCL 100 MG TABS: 100 | 90 days supply | Qty: 180 | Fill #2

## 2018-08-30 MED FILL — SUBVENITE 200 MG TABS: 200 | 90 days supply | Qty: 90 | Fill #1

## 2018-09-05 NOTE — Progress Notes (Signed)
Cardiology Office Note Date:  09/06/2018  Patient ID:  Mary, [name] 1990-08-29, MRN [id number] PCP:  Leighton Ruff, MD  Electrophysiologist:  Dr. Lovena Le    Chief Complaint: 6 month f/u  History of Present Illness: [name] is a 28 y.o. female with history of GERD, obesity, anxiety/depression, ADHD, and autonaumic dysfunction w/syncope.  She comes in today to be seen for Dr. Lovena Le.  Last seen by him in Feb 2020. At that time was a f/u, she had worn an monitor without arrhythmias, had not had further syncope, though did have a couple near syncopal events and was started on midodrine. ? H/o SVT, mentions she had not had any symptomatic SVT.  She reports feel;ing minimally improved on the midodrine currently though only taking daily for the last few weeks, having not gotten enough at her fill for BID.  She does think she had less frequent/signigificant symptoms when taking BID.  She reports she still gets her symptoms, perhaps not as often or as intense, starts in her head, gets a funny feeling, progresses to lightheaded, to date has had time to get seated without syncope.  She mentions at times quick movements of her head alone can make her feel "sick" that she clarifies as dizzy.  Otherwise occurs with postural changes.  No CP or SOB, no palpitations.  She is in Health and safety inspector school currently, spends prolonged time on her feet with this   Past Medical History:  Diagnosis Date  . ADHD (attention deficit hyperactivity disorder)   . Anxiety   . Depression   . Environmental allergies   . GERD (gastroesophageal reflux disease)   . Myopia of both eyes   . Obesity   . Syncope and collapse     No past surgical history on file.  Current Outpatient Medications  Medication Sig Dispense Refill  . ALPRAZolam (XANAX) 0.5 MG tablet Take 1 tablet (0.5 mg total) by mouth as needed for sleep. (Patient taking differently: Take 0.5 mg by mouth as needed for anxiety or sleep. ) 30 tablet 5   . cetirizine (ZYRTEC) 10 MG tablet Take 10 mg by mouth daily.    Marland Kitchen escitalopram (LEXAPRO) 20 MG tablet Take 1 tablet (20 mg total) by mouth daily. 90 tablet 1  . midodrine (PROAMATINE) 5 MG tablet Take 1 tablet (5 mg total) by mouth 2 (two) times daily with a meal. Please make overdue appt with Dr. Lovena Le. 2nd attempt 30 tablet 0  . Multiple Vitamins-Minerals (MULTIVITAL) tablet Take 1 tablet by mouth daily. Gummy    . Naproxen Sodium (ALEVE PO) Take 2 tablets by mouth daily as needed (for pain).    Marland Kitchen lamoTRIgine (LAMICTAL) 200 MG tablet Take 1 tablet (200 mg total) by mouth daily after supper. 90 tablet 1  . traZODone (DESYREL) 100 MG tablet Take 2 tablets (200 mg total) by mouth at bedtime. 180 tablet 1   No current facility-administered medications for this visit.     Allergies:   Cymbalta [duloxetine hcl], Daytrana [methylphenidate], and Duloxetine   Social History:  The patient  reports that she has never smoked. She has never used smokeless tobacco. She reports current alcohol use of about 1.0 standard drinks of alcohol per week. She reports that she does not use drugs.   Family History:  The patient's family history includes Bipolar disorder in her brother, father, and paternal aunt; Cancer in her paternal grandfather; Depression in her mother; Diabetes in her father, maternal grandmother, and paternal grandfather; Heart  disease in her maternal grandfather, maternal grandmother, and paternal grandmother; Hyperlipidemia in her father, maternal grandfather, maternal grandmother, and mother; Hypertension in her maternal grandmother and paternal grandmother.  ROS:  Please see the history of present illness.  All other systems are reviewed and otherwise negative.   PHYSICAL EXAM:  VS:  BP 100/68   Pulse (!) 103   Ht 5\' 2"  (1.575 m)   Wt 212 lb (96.2 kg)   BMI 38.78 kg/m  BMI: Body mass index is 38.78 kg/m. Well nourished, well developed, in no acute distress  HEENT: normocephalic,  atraumatic  Neck: no JVD, carotid bruits or masses Cardiac: RRR; no significant murmurs, no rubs, or gallops Lungs:  CTA b/l, no wheezing, rhonchi or rales  Abd: soft, nontender MS: no deformity or atrophy Ext: no edema  Skin: warm and dry, no rash Neuro:  No gross deficits appreciated Psych: euthymic mood, full affect   EKG:  Recommended given >year since last, though she declines (2/2 financial concerns)  03/2016: 30 day monitor 1. NSR with sinus tachycardia and sinus bradycardia 2. No prolonged pauses 3. No atrial or ventricular arrhythmias. 4. Minimal noise artifact   Recent Labs: No results found for requested labs within last 8760 hours.  No results found for requested labs within last 8760 hours.   CrCl cannot be calculated (No successful lab value found.).   Wt Readings from Last 3 Encounters:  09/06/18 212 lb (96.2 kg)  12/18/17 201 lb 6.4 oz (91.4 kg)  03/04/17 196 lb (88.9 kg)     Other studies reviewed: Additional studies/records reviewed today include: summarized above  ASSESSMENT AND PLAN:  1. Autonomic dysfunction 2. Syncope     No syncope since her last visit in Feb, though continues to have occ lightheaded spells that require her to sit to recover     Refill midodrine 5mg  BID  Orthostatics today are negative with reports of feeling dizzy with normal BP and HR.  She mentioned at times she feels lightheaded, dizzy with quick movements of her head as well.  ? If she may also have a vertigo component to her symptoms.  I would not have expected any symptoms with today's vitals  We discussed safety, strategies.   Urge her to wear support stockings at school when up on her feet and otherwise to avoid prolonged standing. discussed adequate hydration, avoidance of hot environments, including hot showers etc.. Revisited symptom recognition (she is quite familiar and aware)   Disposition: recommend 39mo follow up, she requests 1 year for financial reasons, I  encouraged her to come in sooner if needed for persistent or worsening symptoms.  I also encouraged her to see her PMD for evaluation ? Component of vertigo.    Current medicines are reviewed at length with the patient today.  The patient did not have any concerns regarding medicines.  Norma FredricksonSigned, Myrtle Haller, PA-C 09/06/2018 8:59 AM     [facility] [address] [place] [address] [phone] (office)  [phone] (fax)

## 2018-09-06 ENCOUNTER — Ambulatory Visit (INDEPENDENT_AMBULATORY_CARE_PROVIDER_SITE_OTHER): Payer: Self-pay | Admitting: Physician Assistant

## 2018-09-06 ENCOUNTER — Other Ambulatory Visit: Payer: Self-pay

## 2018-09-06 ENCOUNTER — Encounter: Payer: Self-pay | Admitting: Physician Assistant

## 2018-09-06 VITALS — BP 100/68 | HR 103 | Ht 62.0 in | Wt 212.0 lb

## 2018-09-06 DIAGNOSIS — R55 Syncope and collapse: Secondary | ICD-10-CM

## 2018-09-06 DIAGNOSIS — G909 Disorder of the autonomic nervous system, unspecified: Secondary | ICD-10-CM

## 2018-09-06 MED ORDER — MIDODRINE HCL 5 MG PO TABS: 5.0000 mg | ORAL_TABLET | Freq: Two times a day (BID) | ORAL | 3 refills | Status: DC

## 2018-09-06 NOTE — Patient Instructions (Signed)
Medication Instructions:  Your physician recommends that you continue on your current medications as directed. Please refer to the Current Medication list given to you today.  If you need a refill on your cardiac medications before your next appointment, please call your pharmacy.   Lab work: NONE ORDERED  TODAY  If you have labs (blood work) drawn today and your tests are completely normal, you will receive your results only by: Marland Kitchen MyChart Message (if you have MyChart) OR . A paper copy in the mail If you have any lab test that is abnormal or we need to change your treatment, we will call you to review the results.  Testing/Procedures: NONE ORDERED  TODAY  Follow-Up: CONTACT [facility] 336 [phone] IN A YEAR OR SOONER  AS NEEDED FOR  ANY CARDIAC RELATED SYMPTOMS  Any Other Special Instructions Will Be Listed Below (If Applicable).  PLEASE SEE YOUR PRIMARY CARE DOCTOR FOR VERTIGO

## 2018-10-25 ENCOUNTER — Telehealth: Payer: Self-pay | Admitting: Family

## 2018-10-25 DIAGNOSIS — Z20822 Contact with and (suspected) exposure to covid-19: Secondary | ICD-10-CM

## 2018-10-25 DIAGNOSIS — Z20828 Contact with and (suspected) exposure to other viral communicable diseases: Secondary | ICD-10-CM

## 2018-10-25 DIAGNOSIS — H9190 Unspecified hearing loss, unspecified ear: Secondary | ICD-10-CM

## 2018-10-25 DIAGNOSIS — R059 Cough, unspecified: Secondary | ICD-10-CM

## 2018-10-25 DIAGNOSIS — R05 Cough: Secondary | ICD-10-CM

## 2018-10-25 NOTE — Progress Notes (Signed)
Based on what you shared with me, I feel your condition warrants further evaluation and I recommend that you be seen for a face to face office visit.   Given your symptoms of dizziness with loss of hearing you need to be seen face to face to be evaluated to rule out more serious conditions.   NOTE: If you entered your credit card information for this eVisit, you will not be charged. You may see a "hold" on your card for the $35 but that hold will drop off and you will not have a charge processed.  If you are having a true medical emergency please call 911.     For an urgent face to face visit, [place] has four urgent care centers for your convenience:   . [facility]    [phone]                  Get Driving Directions  [address], [place] [address] . 10 am to 8 pm Monday-Friday . 12 pm to 8 pm Saturday-Sunday   . [facility] [place]                  Get Driving Directions  [address], [place], [place] [address] . 8 am to 8 pm Monday-Friday . 9 am to 6 pm Saturday . 11 am to 6 pm Sunday   . [facility] at [place]                  Get Driving Directions   [address] [place], [place] [address] . 8 am to 8 pm Monday-Friday . 8 am to 4 pm Saturday-Sunday    . [facility] at [place]                    Get Driving Directions  [phone]  [address] [place], [place] [address]  . Monday-Friday, 12 PM to 6 PM    Your e-visit answers were reviewed by a board certified advanced clinical practitioner to complete your personal care plan.  Thank you for using e-Visits.

## 2018-10-26 ENCOUNTER — Other Ambulatory Visit: Payer: Self-pay

## 2018-10-26 DIAGNOSIS — Z20822 Contact with and (suspected) exposure to covid-19: Secondary | ICD-10-CM

## 2018-10-27 LAB — NOVEL CORONAVIRUS, NAA: SARS-CoV-2, NAA: NOT DETECTED

## 2018-10-30 MED FILL — ESCITALOPRAM 20 MG TABLET: 20 | 90 days supply | Qty: 90 | Fill #0

## 2018-11-13 ENCOUNTER — Ambulatory Visit (INDEPENDENT_AMBULATORY_CARE_PROVIDER_SITE_OTHER): Payer: Self-pay | Admitting: Psychiatry

## 2018-11-13 ENCOUNTER — Encounter: Payer: Self-pay | Admitting: Psychiatry

## 2018-11-13 ENCOUNTER — Other Ambulatory Visit: Payer: Self-pay

## 2018-11-13 DIAGNOSIS — F3342 Major depressive disorder, recurrent, in full remission: Secondary | ICD-10-CM

## 2018-11-13 DIAGNOSIS — F411 Generalized anxiety disorder: Secondary | ICD-10-CM

## 2018-11-13 DIAGNOSIS — F5101 Primary insomnia: Secondary | ICD-10-CM

## 2018-11-13 MED ORDER — ALPRAZOLAM 0.5 MG PO TABS: 0.5000 mg | ORAL_TABLET | ORAL | 2 refills | Status: DC | PRN

## 2018-11-13 MED ORDER — BELSOMRA 20 MG PO TABS: 20.0000 mg | ORAL_TABLET | Freq: Every day | ORAL | 0 refills | Status: DC

## 2018-11-13 MED ORDER — ESCITALOPRAM OXALATE 20 MG PO TABS: 20.0000 mg | ORAL_TABLET | Freq: Every day | ORAL | 1 refills | Status: DC

## 2018-11-13 MED ORDER — LAMOTRIGINE 200 MG PO TABS: 200.0000 mg | ORAL_TABLET | Freq: Every day | ORAL | 1 refills | Status: DC

## 2018-11-13 MED ORDER — BELSOMRA 15 MG PO TABS: 15.0000 mg | ORAL_TABLET | Freq: Every day | ORAL | 0 refills | Status: DC

## 2018-11-13 MED FILL — SUBVENITE 200 MG TABS: 200 | 90 days supply | Qty: 90 | Fill #0

## 2018-11-13 MED FILL — ALPRAZolam 0.5 MG TABS: 0.5 | 30 days supply | Qty: 30 | Fill #0

## 2018-11-13 NOTE — Progress Notes (Signed)
[name] [id number] 1990/11/11 28 y.o.  Virtual Visit via Telephone Note  I connected with pt on 11/13/18 at  2:45 PM EST by telephone and verified that I am speaking with the correct person using two identifiers.   I discussed the limitations, risks, security and privacy concerns of performing an evaluation and management service by telephone and the availability of in person appointments. I also discussed with the patient that there may be a patient responsible charge related to this service. The patient expressed understanding and agreed to proceed.   I discussed the assessment and treatment plan with the patient. The patient was provided an opportunity to ask questions and all were answered. The patient agreed with the plan and demonstrated an understanding of the instructions.   The patient was advised to call back or seek an in-person evaluation if the symptoms worsen or if the condition fails to improve as anticipated.  I provided 15 minutes of non-face-to-face time during this encounter.  The patient was located at home.  The provider was located at [facility].   [name], PMHNP   Subjective:   Patient ID:  [name] is a 28 y.o. (DOB [birth year]) female.  Chief Complaint:  Chief Complaint  Patient presents with  . Follow-up    h/o Depression, Anxiety.     HPI [name] presents for follow-up of depression, anxiety, and sleep disturbance.   She reports altered sleep- wake cycle. She reports partial response to Trazodone. She reports that her mood has been "ok" with some depression "here and there." Has had some anxiety in response to school stress. She reports that she has been having panic attacks about every 3 days. She has some worry r/t school. Denies irritability. Appetite has been stable. She reports that she has been exhausted recently, particularly after being sick 3 weeks ago. Motivation has been decreased as well. She reports concentration  has been adequate. Denies SI.  She reports that she has been doing a combination of virtual and in person classes.   She reports that she is rarely taking Alprazolam (about 1/4 tab q 1.5 weeks).   Past Psychiatric Medication Trials: Xanax Lamictal Lexapro Trintellix-allergic reaction Daytrana-Rash Ritalin Evekeo Concerta Vyvanse Sertraline Cymbalta- orthostasis, vision changes Prozac Wellbutrin   Review of Systems:  Review of Systems  HENT:       Increased sensitivity to sounds  Musculoskeletal: Negative for gait problem.  Neurological: Positive for dizziness and headaches. Negative for tremors.  Psychiatric/Behavioral:       Please refer to HPI    Medications: I have reviewed the patient's current medications.  Current Outpatient Medications  Medication Sig Dispense Refill  . ALPRAZolam (XANAX) 0.5 MG tablet Take 1 tablet (0.5 mg total) by mouth as needed for anxiety or sleep. 30 tablet 2  . cetirizine (ZYRTEC) 10 MG tablet Take 10 mg by mouth daily.    . Meclizine HCl (BONINE PO) Take by mouth.    . midodrine (PROAMATINE) 5 MG tablet Take 1 tablet (5 mg total) by mouth 2 (two) times daily with a meal. 180 tablet 3  . Multiple Vitamins-Minerals (MULTIVITAL) tablet Take 1 tablet by mouth daily. Gummy    . naproxen (NAPROSYN) 250 MG tablet Take by mouth 2 (two) times daily with a meal.    . escitalopram (LEXAPRO) 20 MG tablet Take 1 tablet (20 mg total) by mouth daily. 90 tablet 1  . lamoTRIgine (LAMICTAL) 200 MG tablet Take 1 tablet (200 mg total)  by mouth daily after supper. 90 tablet 1  . Naproxen Sodium (ALEVE PO) Take 2 tablets by mouth daily as needed (for pain).    . Suvorexant (BELSOMRA) 15 MG TABS Take 15 mg by mouth at bedtime. 9 tablet 0  . Suvorexant (BELSOMRA) 20 MG TABS Take 20 mg by mouth at bedtime for 9 days. 9 tablet 0   No current facility-administered medications for this visit.     Medication Side Effects: Other: Nausea with higher doses of  Trazodone.   Allergies:  Allergies  Allergen Reactions  . Cymbalta [Duloxetine Hcl] Other (See Comments)    Hypotension  . Daytrana [Methylphenidate] Rash    Like a sunburn  . Duloxetine Other (See Comments)    Fainting    Past Medical History:  Diagnosis Date  . ADHD (attention deficit hyperactivity disorder)   . Anxiety   . Depression   . Environmental allergies   . GERD (gastroesophageal reflux disease)   . Myopia of both eyes   . Obesity   . Syncope and collapse     Family History  Problem Relation Age of Onset  . Bipolar disorder Father   . Diabetes Father   . Hyperlipidemia Father   . Bipolar disorder Brother   . Depression Mother   . Hyperlipidemia Mother   . Diabetes Maternal Grandmother   . Heart disease Maternal Grandmother   . Hyperlipidemia Maternal Grandmother   . Hypertension Maternal Grandmother   . Heart disease Maternal Grandfather   . Hyperlipidemia Maternal Grandfather   . Heart disease Paternal Grandmother   . Hypertension Paternal Grandmother   . Cancer Paternal Grandfather   . Diabetes Paternal Grandfather   . Bipolar disorder Paternal Aunt     Social History   Socioeconomic History  . Marital status: Single    Spouse name: Not on file  . Number of children: Not on file  . Years of education: Not on file  . Highest education level: Not on file  Occupational History  . Occupation: Consulting civil engineer  Social Needs  . Financial resource strain: Not on file  . Food insecurity    Worry: Not on file    Inability: Not on file  . Transportation needs    Medical: Not on file    Non-medical: Not on file  Tobacco Use  . Smoking status: Never Smoker  . Smokeless tobacco: Never Used  Substance and Sexual Activity  . Alcohol use: Yes    Alcohol/week: 1.0 standard drinks    Types: 1 Glasses of wine per week    Comment: Occasionaly  . Drug use: No  . Sexual activity: Never    Birth control/protection: Pill  Lifestyle  . Physical activity    Days  per week: Not on file    Minutes per session: Not on file  . Stress: Not on file  Relationships  . Social Musician on phone: Not on file    Gets together: Not on file    Attends religious service: Not on file    Active member of club or organization: Not on file    Attends meetings of clubs or organizations: Not on file    Relationship status: Not on file  . Intimate partner violence    Fear of current or ex partner: Not on file    Emotionally abused: Not on file    Physically abused: Not on file    Forced sexual activity: Not on file  Other Topics Concern  .  Not on file  Social History Narrative   07/05/12 AHW  Luka was born in [place], [place] and she currently lives with her mother, father, and her younger brother. She has an older brother who lives in [place]. She graduated high school. She is currently attending [employer] studying biology. She plans to return to [employer] to complete a degree in marine biology.  She is currently unemployed. 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  She denies any history of legal problems. She reports that her social support system consists of her therapist. She enjoys playing video games, but watching, cocaine, sewing, white in nature documentaries, and hiking. 07/05/12 AHW      Regular exercise: no   Caffeine use: no    Past Medical History, Surgical history, Social history, and Family history were reviewed and updated as appropriate.   Please see review of systems for further details on the patient's review from today.   Objective:   Physical Exam:  There were no vitals taken for this visit.  Physical Exam Neurological:     Mental Status: She is alert and oriented to person, place, and time.     Cranial Nerves: No dysarthria.  Psychiatric:        Attention and Perception: Attention normal.        Mood and Affect: Mood is anxious.        Speech: Speech normal.        Behavior: Behavior is cooperative.         Thought Content: Thought content normal. Thought content is not paranoid or delusional. Thought content does not include homicidal or suicidal ideation. Thought content does not include homicidal or suicidal plan.        Cognition and Memory: Cognition and memory normal.        Judgment: Judgment normal.     Comments: Mood presents as mildly depressed. Insight intact.     Lab Review:  No results found for: NA, K, CL, CO2, GLUCOSE, BUN, CREATININE, CALCIUM, PROT, ALBUMIN, AST, ALT, ALKPHOS, BILITOT, GFRNONAA, GFRAA  No results found for: WBC, RBC, HGB, HCT, PLT, MCV, MCH, MCHC, RDW, LYMPHSABS, MONOABS, EOSABS, BASOSABS  No results found for: POCLITH, LITHIUM   No results found for: PHENYTOIN, PHENOBARB, VALPROATE, CBMZ   .res Assessment: Plan:   Discussed potential benefits, risks, and side effects of treatment options for chronic insomnia to include Belsomra since patient reports that trazodone is no longer as effective and may be causing nausea.  Patient agrees to trial of Belsomra.  Samples pulled for patient to pick up and start trial of Belsomra 15 mg at bedtime for 9 days, then increase to 20 mg at bedtime if not experiencing any tolerability issues or significant improvement in sleep.  Also provided patient with patient assistance application in the event that Belsomra is effective for her insomnia.  Advised patient to return application form to office and to also notify office if Belsomra is effective so she could be provided with additional samples or prescription sent to use with voucher. Will discontinue trazodone due to tolerability issues. Will continue all other medications as prescribed. Patient to follow-up in 6 months or sooner if clinically indicated. Patient advised to contact office with any questions, adverse effects, or acute worsening in signs and symptoms.  Geniene was seen today for follow-up.  Diagnoses and all orders for this visit:  Primary insomnia -      Suvorexant (BELSOMRA) 15 MG TABS; Take 15 mg by mouth  at bedtime. -     Suvorexant (BELSOMRA) 20 MG TABS; Take 20 mg by mouth at bedtime for 9 days.  Recurrent major depressive disorder, in full remission (HCC) -     escitalopram (LEXAPRO) 20 MG tablet; Take 1 tablet (20 mg total) by mouth daily. -     lamoTRIgine (LAMICTAL) 200 MG tablet; Take 1 tablet (200 mg total) by mouth daily after supper.  Anxiety state -     ALPRAZolam (XANAX) 0.5 MG tablet; Take 1 tablet (0.5 mg total) by mouth as needed for anxiety or sleep.    Please see After Visit Summary for patient specific instructions.  No future appointments.  No orders of the defined types were placed in this encounter.     -------------------------------

## 2018-11-29 MED FILL — SUBVENITE 200 MG TABS: 200 | 90 days supply | Qty: 90 | Fill #1

## 2018-11-29 MED FILL — ALPRAZolam 0.5 MG TABS: 0.5 | 30 days supply | Qty: 30 | Fill #0

## 2018-12-05 ENCOUNTER — Other Ambulatory Visit: Payer: Self-pay

## 2018-12-05 DIAGNOSIS — Z20822 Contact with and (suspected) exposure to covid-19: Secondary | ICD-10-CM

## 2018-12-07 LAB — NOVEL CORONAVIRUS, NAA: SARS-CoV-2, NAA: NOT DETECTED

## 2018-12-12 ENCOUNTER — Other Ambulatory Visit: Payer: Self-pay

## 2018-12-12 DIAGNOSIS — Z20822 Contact with and (suspected) exposure to covid-19: Secondary | ICD-10-CM

## 2018-12-14 LAB — NOVEL CORONAVIRUS, NAA: SARS-CoV-2, NAA: NOT DETECTED

## 2019-01-29 MED FILL — traZODone HCL 100 MG TABS: 100 | 90 days supply | Qty: 180 | Fill #0

## 2019-02-02 ENCOUNTER — Other Ambulatory Visit: Payer: Self-pay

## 2019-02-02 ENCOUNTER — Ambulatory Visit (INDEPENDENT_AMBULATORY_CARE_PROVIDER_SITE_OTHER): Payer: Self-pay | Admitting: Psychiatry

## 2019-02-02 DIAGNOSIS — F3342 Major depressive disorder, recurrent, in full remission: Secondary | ICD-10-CM

## 2019-02-02 NOTE — Progress Notes (Signed)
      [facility] Counselor/Therapist Progress Note  Patient ID: [name], MRN: [id number],    Date: 02/02/2019  Time Spent: 52 minutes start time 1:08 PM end time 2 PM  Treatment Type: Individual Therapy  Reported Symptoms: depression, crying spells, anxiety, panic attacks, sleep issues  Mental Status Exam:  Appearance:   Fairly Groomed     Behavior:  Sharing  Motor:  Normal  Speech/Language:   Normal Rate  Affect:  Labile  Mood:  depressed  Thought process:  circumstantial  Thought content:    WNL  Sensory/Perceptual disturbances:    WNL  Orientation:  oriented to person, place, time/date and situation  Attention:  Good  Concentration:  Good  Memory:  WNL  Fund of knowledge:   Fair  Insight:    Fair  Judgment:   Fair  Impulse Control:  Good   Risk Assessment: Danger to Self:  No Self-injurious Behavior: No Danger to Others: No Duty to Warn:no Physical Aggression / Violence:No  Access to Firearms a concern: No  Gang Involvement:No   Subjective: Patient was present for session.  She shared that her grandfather passed away and that has been hard on her mother.  She is realizing that she has to get a job and start moving on in her life.  She is still in school which is a positive thing.  Encourage patient to focus on the positives and recognize that she is making some positive progress.  Patient went on to explain that she was able to spend some time with her new nephew and that was a good thing.  Patient also shared that her brother is living with her dad which is also a positive thing.  Patient reported though she is very uncertain about how to find a job or what to do to change some of her depression habits.  Patient explained that she got very overwhelmed with a role with her gaming group.  She shared that that was the most upsetting for her currently discussed what occurred and different ways she could handle the situation.  Patient was encouraged to remember to focus  on the things she can control fix and change and to focus on letting go of those other things.  Patient was encouraged to get back to taking small steps to be awake more during the day and to get to on a more appropriate schedule.  Interventions: Cognitive Behavioral Therapy and Solution-Oriented/Positive Psychology  Diagnosis:   ICD-10-CM   1. Recurrent major depressive disorder, in full remission (HCC)  F33.42     Plan: Is to focus on CBT and coping skills to work on decreasing depression symptoms.  Patient is to focus on getting more of a schedule and routine in her life and trying to be awake more during the day and sleep better in the evenings.   Stevphen Meuse, [facility]

## 2019-02-16 ENCOUNTER — Ambulatory Visit (INDEPENDENT_AMBULATORY_CARE_PROVIDER_SITE_OTHER): Payer: Self-pay | Admitting: Psychiatry

## 2019-02-16 ENCOUNTER — Other Ambulatory Visit: Payer: Self-pay

## 2019-02-16 DIAGNOSIS — F3342 Major depressive disorder, recurrent, in full remission: Secondary | ICD-10-CM

## 2019-02-16 NOTE — Progress Notes (Signed)
      [facility] Counselor/Therapist Progress Note  Patient ID: [name], MRN: [id number],    Date: 02/16/2019  Time Spent: 52 minutes start time 2:59 PM end time 3:51 PM  Treatment Type: Individual Therapy  Reported Symptoms: sadness, sleep issues, low motivation,anxiety  Mental Status Exam:  Appearance:   Fairly Groomed     Behavior:  Appropriate  Motor:  Normal  Speech/Language:   Normal Rate  Affect:  Appropriate  Mood:  anxious  Thought process:  normal  Thought content:    Rumination  Sensory/Perceptual disturbances:    WNL  Orientation:  oriented to person, place, time/date and situation  Attention:  Good  Concentration:  Good  Memory:  WNL  Fund of knowledge:   Fair  Insight:    Fair  Judgment:   Good  Impulse Control:  Good   Risk Assessment: Danger to Self:  No Self-injurious Behavior: No Danger to Others: No Duty to Warn:no Physical Aggression / Violence:No  Access to Firearms a concern: No  Gang Involvement:No   Subjective: Patient was present for session. She shared that she is having anxiety over having to get a job.  She went on to share that after last session she felt better about things overall.  She did go with her family to celebrate father's birthday and it went well and she was glad she went and got out of the house. She had to withdraw from school due to not being able to find a job.  She was able to get things resolved with her friends and that was helpful for her.  She went on to share that she was able to help her friend and it felt good to know that she could be helpful. Patient reported that she is starting to get things in a better manner with her mother and sleep so they are having dinners together and that is helping her mood.  Brainstormed different options for work since patient reported the financial situation is creating the most issue for her.  Patient was able to think through some other options and think of ways to talk herself  through the anxiety.  During session she received a call concerning one of her applications after the call was completed encouraged her to think through what she needed to say in her head to keep things positive for herself.  Patient was given multiple hand outs from different CBT books so that she can practice retraining her thoughts and prepare for her future.  Interventions: Cognitive Behavioral Therapy and Solution-Oriented/Positive Psychology  Diagnosis:   ICD-10-CM   1. Recurrent major depressive disorder, in full remission (HCC)  F33.42     Plan: Patient is to practice CBT and coping skills to decrease depression symptoms. Patient is to work on handouts for CBT that she was given in session.   Long-term goal: Develop healthy cognitive patterns and beliefs about self and world that lead to alleviation and help prevent the relapse of depression symptoms Short-term goal: Verbally identify if possible source of depressed mood  Stevphen Meuse, [facility]

## 2019-02-26 MED FILL — SUBVENITE 200 MG TABS: 200 | 90 days supply | Qty: 90 | Fill #0

## 2019-03-23 ENCOUNTER — Ambulatory Visit: Payer: Self-pay | Attending: Internal Medicine

## 2019-03-23 DIAGNOSIS — Z20822 Contact with and (suspected) exposure to covid-19: Secondary | ICD-10-CM | POA: Insufficient documentation

## 2019-03-24 LAB — NOVEL CORONAVIRUS, NAA: SARS-CoV-2, NAA: NOT DETECTED

## 2019-03-28 ENCOUNTER — Ambulatory Visit: Payer: Self-pay | Admitting: Psychiatry

## 2019-04-19 ENCOUNTER — Other Ambulatory Visit: Payer: Self-pay | Admitting: Psychiatry

## 2019-04-19 DIAGNOSIS — F3342 Major depressive disorder, recurrent, in full remission: Secondary | ICD-10-CM

## 2019-04-20 MED FILL — ESCITALOPRAM 20 MG TABLET: 20 | 90 days supply | Qty: 90 | Fill #0

## 2019-05-15 ENCOUNTER — Ambulatory Visit (INDEPENDENT_AMBULATORY_CARE_PROVIDER_SITE_OTHER): Payer: Self-pay | Admitting: Psychiatry

## 2019-05-15 ENCOUNTER — Other Ambulatory Visit: Payer: Self-pay

## 2019-05-15 DIAGNOSIS — F3342 Major depressive disorder, recurrent, in full remission: Secondary | ICD-10-CM

## 2019-05-15 NOTE — Progress Notes (Signed)
[facility] Counselor/Therapist Progress Note  Patient ID: [name], MRN: [id number],    Date: 05/15/2019  Time Spent: 51 minutes start time 1:07 PM and time 1:58 PM Virtual Visit via Telephone Note Connected with patient by a video enabled telemedicine/telehealth application or telephone, with their informed consent, and verified patient privacy and that I am speaking with the correct person using two identifiers. I discussed the limitations, risks, security and privacy concerns of performing psychotherapy and management service by telephone and the availability of in person appointments. I also discussed with the patient that there may be a patient responsible charge related to this service. The patient expressed understanding and agreed to proceed. I discussed the treatment planning with the patient. The patient was provided an opportunity to ask questions and all were answered. The patient agreed with the plan and demonstrated an understanding of the instructions. The patient was advised to call  our office if  symptoms worsen or feel they are in a crisis state and need immediate contact.   Therapist Location: office Patient Location: home    Treatment Type: Individual Therapy  Reported Symptoms: anxiety, sadness, stomach issues, panic attacks, isolating, procrastination  Mental Status Exam:  Appearance:   NA     Behavior:  na  Motor:  na  Speech/Language:   Normal Rate  Affect:  NA  Mood:  sad  Thought process:  normal  Thought content:    WNL  Sensory/Perceptual disturbances:    WNL  Orientation:  oriented to person, place, time/date and situation  Attention:  Good  Concentration:  Good  Memory:  WNL  Fund of knowledge:   Good  Insight:    Good  Judgment:   Good  Impulse Control:  Good   Risk Assessment: Danger to Self:  No current thoughts but has them passing discussed taking meds as directed and to call the suicide hotline if needed. Self-injurious  Behavior: No Danger to Others: No Duty to Warn:no Physical Aggression / Violence:No  Access to Firearms a concern: No  Gang Involvement:No   Subjective: Met with patient via phone.  She shared she couldn't come in for session due to feeling sick. She explained that she had been working at [employer] but she quite due to the stress she was under that caused her to get physically sick. She went on to share she worked the job for  A few months and she has been going to school.  She explained that it is all too much.  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 Patient went on to explain that she is realizing her mother isn't going to be around forever so she needs  To figure out what  She is going to do to take care of herself. Patient went on to explain that she her mood got bad because she had a bad schedule and had no life outside of work.  She was encouraged to realize that she can take care of herself and she does has to figure out what it is that she wants to do to give herself what she needs to be on her own.  Patient was encouraged to continue focusing on the computer science since that seems to be what she enjoys and she has talked about skills in that area in the past.  Patient was encouraged to use her  affirmations and her regular self talk to remind herself she can get to the other side of the difficult situation.  She was also encouraged to take things 1 step at a time and to try not to get so overwhelmed with emotions as she is going through this process.  Patient was able to recognize the fact that she does not want to have to move back in with her father due to the situation at the house there really is not a place for her to move in with her things.  Patient was also encouraged to work on recognizing her strengths and gifts so that they can come across to in different interviews.  Interventions:  Solution-Oriented/Positive Psychology  Diagnosis:   ICD-10-CM   1. Recurrent major depressive disorder, in full remission ([place])  F33.42     Plan: Patient is to use CBT and coping skills to decrease depression symptoms.  Patient is to work on Stage manager for interviews to help find some sort of working get her brain engaged in positive activity.  Patient is also to work on getting into a regular sleep schedule to help improve her mood as well. Long term goal: Develop healthy cognitive patterns  And beliefs about self and the world that lead to alleviation and help prevent relapse of depression symptoms Short term goal: Verbally identify if possible the source of depressed mood  [name], [facility]

## 2019-05-30 MED FILL — SUBVENITE 200 MG TABS: 200 | 90 days supply | Qty: 90 | Fill #1

## 2019-06-14 ENCOUNTER — Ambulatory Visit (INDEPENDENT_AMBULATORY_CARE_PROVIDER_SITE_OTHER): Payer: Self-pay | Admitting: Psychiatry

## 2019-06-14 ENCOUNTER — Telehealth: Payer: Self-pay | Admitting: Psychiatry

## 2019-06-14 DIAGNOSIS — F331 Major depressive disorder, recurrent, moderate: Secondary | ICD-10-CM

## 2019-06-14 NOTE — Telephone Encounter (Signed)
[name], grills are scheduled for a virtual visit with your provider today.    Just as we do with appointments in the office, we must obtain your consent to participate.  Your consent will be active for this visit and any virtual visit you may have with one of our providers in the next 365 days.    If you have a MyChart account, I can also send a copy of this consent to you electronically.  All virtual visits are billed to your insurance company just like a traditional visit in the office.  As this is a virtual visit, video technology does not allow for your provider to perform a traditional examination.  This may limit your provider's ability to fully assess your condition.  If your provider identifies any concerns that need to be evaluated in person or the need to arrange testing such as labs, EKG, etc, we will make arrangements to do so.    Although advances in technology are sophisticated, we cannot ensure that it will always work on either your end or our end.  If the connection with a video visit is poor, we may have to switch to a telephone visit.  With either a video or telephone visit, we are not always able to ensure that we have a secure connection.   I need to obtain your verbal consent now.   Are you willing to proceed with your visit today?   [name] has provided verbal consent on 06/14/2019 for a virtual visit (video or telephone).   Stevphen Meuse, [facility] 06/14/2019  1:08 PM

## 2019-06-14 NOTE — Progress Notes (Signed)
[facility] Counselor/Therapist Progress Note  Patient ID: [name], MRN: [id number],    Date: 06/14/2019  Time Spent: 52 minutes start time 1:06 end time 1:58 PM Virtual Visit via Telephone Note Connected with patient by a video enabled telemedicine/telehealth application or telephone, with their informed consent, and verified patient privacy and that I am speaking with the correct person using two identifiers. I discussed the limitations, risks, security and privacy concerns of performing psychotherapy and management service by telephone and the availability of in person appointments. I also discussed with the patient that there may be a patient responsible charge related to this service. The patient expressed understanding and agreed to proceed. I discussed the treatment planning with the patient. The patient was provided an opportunity to ask questions and all were answered. The patient agreed with the plan and demonstrated an understanding of the instructions. The patient was advised to call  our office if  symptoms worsen or feel they are in a crisis state and need immediate contact.   Therapist Location: office Patient Location: home    Treatment Type: Individual Therapy  Reported Symptoms: sleep issues, sadness, crying spells, anxiety, self doubt, worthlessness  Mental Status Exam:  Appearance:   NA     Behavior:  Sharing  Motor:  NA  Speech/Language:   Normal Rate  Affect:  NA  Mood:  sad  Thought process:  normal  Thought content:    WNL  Sensory/Perceptual disturbances:    WNL  Orientation:  oriented to person, place, time/date and situation  Attention:  Good  Concentration:  Good  Memory:  WNL  Fund of knowledge:   Fair  Insight:    Fair  Judgment:   Good  Impulse Control:  Good   Risk Assessment: Danger to Self:  patient shared that she has +SI at times but can manage it  Self-injurious Behavior: No Danger to Others: No Duty to Warn:no Physical  Aggression / Violence:No  Access to Firearms a concern: No  Gang Involvement:No   Subjective: Met with patient via phone.  She explained she threw up and so she could not get to office.  She went on to share she is very stressed due to not being able find a job.  She went on to share that she does have an interview tomorrow.  She stated her depression is increasing and she is concerned it is impacting her interviewing.  Her mother has also been struggling due to an argument with her brother. Patient explained she does not do well when her mother is depressed.  She shared that she has talked to her brother about the situation and recognizes that her mother has to fix it but it is hard for her to see the situation.  Patient also shared she is struggling with the fact that her choice and job is not making her happy.  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  Patient was encouraged to figure out how to get to a financial place where she can just go back to school.  She was encouraged to look at the interviews and the jobs that she has right now is temporary and to realize that she just has to get the money to take care of her needs and then she can get what she truly wants.  Patient also explained her days and nights are mixed up.  Discussed some different ways to start gradually getting herself back on her normal  schedule.  Agreed to work on plans from session and acknowledged that she has to talk her self through the different situations.  Discussed some different CBT skills for her to use.  Agreed to send her some handouts for follow-up with things discussed in session.  Interventions: Cognitive Behavioral Therapy and Solution-Oriented/Positive Psychology  Diagnosis:   ICD-10-CM   1. Major depressive disorder, recurrent episode, moderate ([place])  F33.1     Plan: Patient is to use CBT and coping skills to decrease depression symptoms.  Patient is to follow through with the handouts  that will be sent to her to follow-up from session.  Patient is to work on getting her sleep schedule back to normal.  She is also to work on her self-care. Long-term goal: Develop healthy cognitive patterns and beliefs about self in the world that lead to alleviation and help prevent relapse of depression symptoms Short-term goal: Verbally identify if possible to source of depressed mood  [name], [facility]

## 2019-07-11 ENCOUNTER — Emergency Department (HOSPITAL_COMMUNITY): Payer: Self-pay

## 2019-07-11 ENCOUNTER — Emergency Department (HOSPITAL_COMMUNITY)
Admission: EM | Admit: 2019-07-11 | Discharge: 2019-07-11 | Disposition: A | Discharge status: Home / Self Care | Payer: Self-pay | Attending: Emergency Medicine | Admitting: Emergency Medicine

## 2019-07-11 ENCOUNTER — Other Ambulatory Visit: Payer: Self-pay

## 2019-07-11 ENCOUNTER — Encounter (HOSPITAL_COMMUNITY): Payer: Self-pay | Admitting: *Deleted

## 2019-07-11 DIAGNOSIS — Z79899 Other long term (current) drug therapy: Secondary | ICD-10-CM | POA: Insufficient documentation

## 2019-07-11 DIAGNOSIS — J069 Acute upper respiratory infection, unspecified: Secondary | ICD-10-CM | POA: Insufficient documentation

## 2019-07-11 DIAGNOSIS — Z20822 Contact with and (suspected) exposure to covid-19: Secondary | ICD-10-CM | POA: Insufficient documentation

## 2019-07-11 LAB — SARS CORONAVIRUS 2 (TAT 6-24 HRS): SARS Coronavirus 2: NEGATIVE

## 2019-07-11 MED ORDER — SODIUM CHLORIDE 0.9% FLUSH: 3.0000 mL | Freq: Once | INTRAVENOUS | Status: DC

## 2019-07-11 MED ORDER — ALBUTEROL SULFATE HFA 108 (90 BASE) MCG/ACT IN AERS
1.0000 | INHALATION_SPRAY | Freq: Once | RESPIRATORY_TRACT | Status: AC
  Administered 2019-07-11: 1 via RESPIRATORY_TRACT
  Filled 2019-07-11: qty 6.7

## 2019-07-11 MED ORDER — BENZONATATE 100 MG PO CAPS
100.0000 mg | ORAL_CAPSULE | Freq: Three times a day (TID) | ORAL | 0 refills | Status: DC
Start: 2019-07-11 — End: 2019-12-12

## 2019-07-11 MED FILL — BENZONATATE 100 MG CAPS: 100 | 7 days supply | Qty: 21 | Fill #0

## 2019-07-11 NOTE — ED Notes (Signed)
Pt has previously refused blood work.

## 2019-07-11 NOTE — ED Triage Notes (Signed)
Pt reports onset of sob this am with chest pressure. Reports chronic cough x 2 years. No resp distress is noted at triage.

## 2019-07-11 NOTE — ED Notes (Signed)
Pt has severe needle phobia and anxiety, refuses blood draw at triage.

## 2019-07-11 NOTE — ED Notes (Signed)
Pt refused blood work  

## 2019-07-11 NOTE — ED Provider Notes (Addendum)
[facility] Provider Note   CSN: [id number] Arrival date & time: 07/11/19  [id number]     History Chief Complaint  Patient presents with  . Shortness of Breath    [name] is a 29 y.o. female with a past medical history of ADHD, anxiety, autonomic dysfunction currently on midodrine presenting to the ED with a chief complaint of shortness of breath.  Woke up this morning feeling like she was short of breath and had chest tightness.  States that she had a similar episode 2 days ago which resolved after 5 minutes.  The specific episode resolved.  She did not take any medications to help with the symptoms.  She has had a chronic nonproductive cough for the past 2 years denies any changes from baseline.  Denies any fever, leg swelling, OCP use, history of DVT or PE, recent immobilization, sick contacts or similar symptoms, abdominal pain, nausea.  HPI     Past Medical History:  Diagnosis Date  . ADHD (attention deficit hyperactivity disorder)   . Anxiety   . Depression   . Environmental allergies   . GERD (gastroesophageal reflux disease)   . Myopia of both eyes   . Obesity   . Syncope and collapse     Patient Active Problem List   Diagnosis Date Noted  . Major depressive disorder, recurrent episode, severe, without mention of psychotic behavior 02/14/2013  . Overweight(278.02) 01/26/2013    History reviewed. No pertinent surgical history.   OB History   No obstetric history on file.     Family History  Problem Relation Age of Onset  . Bipolar disorder Father   . Diabetes Father   . Hyperlipidemia Father   . Bipolar disorder Brother   . Depression Mother   . Hyperlipidemia Mother   . Diabetes Maternal Grandmother   . Heart disease Maternal Grandmother   . Hyperlipidemia Maternal Grandmother   . Hypertension Maternal Grandmother   . Heart disease Maternal Grandfather   . Hyperlipidemia Maternal Grandfather   . Heart disease  Paternal Grandmother   . Hypertension Paternal Grandmother   . Cancer Paternal Grandfather   . Diabetes Paternal Grandfather   . Bipolar disorder Paternal Aunt     Social History   Tobacco Use  . Smoking status: Never Smoker  . Smokeless tobacco: Never Used  Substance Use Topics  . Alcohol use: Yes    Alcohol/week: 1.0 standard drink    Types: 1 Glasses of wine per week    Comment: Occasionaly  . Drug use: No    Home Medications Prior to Admission medications   Medication Sig Start Date End Date Taking? Authorizing Provider  ALPRAZolam Prudy Feeler(XANAX) 0.5 MG tablet Take 1 tablet (0.5 mg total) by mouth as needed for anxiety or sleep. 11/13/18 12/13/18  Corie Chiquitoarter, Jessica, PMHNP  benzonatate (TESSALON) 100 MG capsule Take 1 capsule (100 mg total) by mouth every 8 (eight) hours. 07/11/19   Nautika Cressey, PA-C  cetirizine (ZYRTEC) 10 MG tablet Take 10 mg by mouth daily.    [provider]  escitalopram (LEXAPRO) 20 MG tablet TAKE 1 TABLET BY MOUTH DAILY. 04/19/19   Corie Chiquitoarter, Jessica, PMHNP  lamoTRIgine (LAMICTAL) 200 MG tablet Take 1 tablet (200 mg total) by mouth daily after supper. 11/13/18 02/11/19  Corie Chiquitoarter, Jessica, PMHNP  Meclizine HCl (BONINE PO) Take by mouth.    [provider]  midodrine (PROAMATINE) 5 MG tablet Take 1 tablet (5 mg total) by mouth 2 (two) times  daily with a meal. 09/06/18   Sheilah Pigeon, PA-C  Multiple Vitamins-Minerals (MULTIVITAL) tablet Take 1 tablet by mouth daily. Gummy    [provider]  naproxen (NAPROSYN) 250 MG tablet Take by mouth 2 (two) times daily with a meal.    [provider]  Naproxen Sodium (ALEVE PO) Take 2 tablets by mouth daily as needed (for pain).    [provider]  Suvorexant (BELSOMRA) 15 MG TABS Take 15 mg by mouth at bedtime. 11/13/18   [name], PMHNP  Suvorexant (BELSOMRA) 20 MG TABS Take 20 mg by mouth at bedtime for 9 days. 11/13/18 11/22/18  [name], PMHNP    Allergies    Cymbalta  [duloxetine hcl], Daytrana [methylphenidate], and Duloxetine  Review of Systems   Review of Systems  Constitutional: Negative for appetite change, chills and fever.  HENT: Negative for ear pain, rhinorrhea, sneezing and sore throat.   Eyes: Negative for photophobia and visual disturbance.  Respiratory: Positive for cough (chronic), chest tightness and shortness of breath. Negative for wheezing.   Cardiovascular: Negative for chest pain and palpitations.  Gastrointestinal: Negative for abdominal pain, blood in stool, constipation, diarrhea, nausea and vomiting.  Genitourinary: Negative for dysuria, hematuria and urgency.  Musculoskeletal: Negative for myalgias.  Skin: Negative for rash.  Neurological: Negative for dizziness, weakness and light-headedness.    Physical Exam Updated Vital Signs BP 101/79   Pulse 69   Temp 98.4 F (36.9 C) (Oral)   Resp 13   SpO2 99%   Physical Exam Vitals and nursing note reviewed.  Constitutional:      General: She is not in acute distress.    Appearance: She is well-developed.     Comments: Speaking in complete sentences without difficulty.  HENT:     Head: Normocephalic and atraumatic.     Nose: Nose normal.  Eyes:     General: No scleral icterus.       Left eye: No discharge.     Conjunctiva/sclera: Conjunctivae normal.  Cardiovascular:     Rate and Rhythm: Normal rate and regular rhythm.     Heart sounds: Normal heart sounds. No murmur heard.  No friction rub. No gallop.   Pulmonary:     Effort: Pulmonary effort is normal. No respiratory distress.     Breath sounds: Normal breath sounds.  Abdominal:     General: Bowel sounds are normal. There is no distension.     Palpations: Abdomen is soft.     Tenderness: There is no abdominal tenderness. There is no guarding.  Musculoskeletal:        General: Normal range of motion.     Cervical back: Normal range of motion and neck supple.     Comments: No lower extremity edema, erythema or  calf tenderness bilaterally.  Skin:    General: Skin is warm and dry.     Findings: No rash.  Neurological:     Mental Status: She is alert.     Motor: No abnormal muscle tone.     Coordination: Coordination normal.     ED Results / Procedures / Treatments   Labs (all labs ordered are listed, but only abnormal results are displayed) Labs Reviewed  SARS CORONAVIRUS 2 (TAT 6-24 HRS)    EKG EKG Interpretation  Date/Time:  Wednesday July 11 2019 07:55:29 EDT Ventricular Rate:  76 PR Interval:  144 QRS Duration: 80 QT Interval:  376 QTC Calculation: 423 R Axis:   86 Text Interpretation: Normal sinus  rhythm Normal ECG No previous ECGs available Confirmed by Alvira Monday ([id number]) on 07/11/2019 1:16:42 PM   Radiology DG Chest 2 View  Result Date: 07/11/2019 CLINICAL DATA:  Chest pain shortness of breath for several hours, initial encounter EXAM: CHEST - 2 VIEW COMPARISON:  None. FINDINGS: The heart size and mediastinal contours are within normal limits. Both lungs are clear. The visualized skeletal structures are unremarkable. IMPRESSION: No active cardiopulmonary disease. Electronically Signed   By: Alcide Clever M.D.   On: 07/11/2019 08:32    Procedures Procedures (including critical care time)  Medications Ordered in ED Medications  sodium chloride flush (NS) 0.9 % injection 3 mL (has no administration in time range)  albuterol (VENTOLIN HFA) 108 (90 Base) MCG/ACT inhaler 1 puff (1 puff Inhalation Given 07/11/19 1446)    ED Course  I have reviewed the triage vital signs and the nursing notes.  Pertinent labs & imaging results that were available during my care of the patient were reviewed by me and considered in my medical decision making (see chart for details).    MDM Rules/Calculators/A&P                          29 year old female with a past medical history of anxiety and autonomic dysfunction currently on midodrine presenting to the ED with a chief complaint of  shortness of breath and chest tightness.  Symptoms began earlier this morning lasted for about 30 minutes before spontaneously resolving.  Had a similar episode 2 days ago which lasted about 5 to 10 minutes and then spontaneously resolved as well.  She denies any chest pain, leg swelling, history of DVT or PE, recent immobilization or OCP use.  On exam patient is speaking complete sentences without difficulty.  She is not tachycardic, tachypneic or hypoxic.  No lower extremity edema, erythema or calf tenderness that concern me for DVT.  Patient EKG shows normal sinus rhythm, no ischemic changes.  Chest x-ray without any acute findings.  Attempted to obtain lab work on patient although she states that she is severely needle phobic.  States that if we need to draw blood from her "you can have to give me a tranquilizer."  I had a shared decision making conversation with the patient regarding obtaining lab work.  Because she is PERC negative, she is low risk for ACS based on no family history of ACS setting and age, no personal risk factors.  At this time there is no evidence of any ischemia on her EKG, no evidence of pneumonia or pneumothorax on her chest x-ray.  She is agreeable to lab work not being obtained.  She knows to follow-up with her cardiologist.  In the meantime we will give her albuterol as needed as well as antitussives.  Suspect anxiety versus viral cause of her symptoms.  Return precautions given.  She is requesting a Covid test.  All imaging, if done today, including plain films, CT scans, and ultrasounds, independently reviewed by me, and interpretations confirmed via formal radiology reads.  Patient is hemodynamically stable, in NAD, and able to ambulate in the ED. Evaluation does not show pathology that would require ongoing emergent intervention or inpatient treatment. I explained the diagnosis to the patient. Pain has been managed and has no complaints prior to discharge. Patient is  comfortable with above plan and is stable for discharge at this time. All questions were answered prior to disposition. Strict return precautions for returning to the ED  were discussed. Encouraged follow up with PCP.   An After Visit Summary was printed and given to the patient.   Portions of this note were generated with Scientist, clinical (histocompatibility and immunogenetics). Dictation errors may occur despite best attempts at proofreading.  Final Clinical Impression(s) / ED Diagnoses Final diagnoses:  Viral URI  Suspected COVID-19 virus infection    Rx / DC Orders ED Discharge Orders         Ordered    benzonatate (TESSALON) 100 MG capsule  Every 8 hours     Discontinue  Reprint     07/11/19 1450              Dietrich Pates, PA-C 07/11/19 1518    Alvira Monday, MD 07/12/19 2128

## 2019-07-11 NOTE — ED Notes (Signed)
Patient verbalizes understanding of discharge instructions. Opportunity for questioning and answers were provided. Armband removed by staff, pt discharged from ED.  

## 2019-07-11 NOTE — Discharge Instructions (Addendum)
Take medications to help with your symptoms. Follow-up with your primary care provider and cardiologist. Return to the ER for worsening shortness of breath, chest pain, leg swelling.

## 2019-07-19 MED FILL — ONDANSETRON ODT 4 MG TABLET: 4 | 14 days supply | Qty: 14 | Fill #0

## 2019-07-19 MED FILL — clonazePAM 0.5 MG TABS: 0.5 | 10 days supply | Qty: 10 | Fill #0

## 2019-07-19 MED FILL — ESCITALOPRAM 20 MG TABLET: 20 | 90 days supply | Qty: 90 | Fill #1

## 2019-08-10 ENCOUNTER — Other Ambulatory Visit: Payer: Self-pay

## 2019-08-10 ENCOUNTER — Ambulatory Visit (INDEPENDENT_AMBULATORY_CARE_PROVIDER_SITE_OTHER): Payer: Self-pay | Admitting: Psychiatry

## 2019-08-10 DIAGNOSIS — F331 Major depressive disorder, recurrent, moderate: Secondary | ICD-10-CM

## 2019-08-10 NOTE — Progress Notes (Signed)
[facility] Counselor/Therapist Progress Note  Patient ID: [name], MRN: [id number],    Date: 08/10/2019  Time Spent: 50 minutes start time 1:57 PM end time 1:47 PM  Treatment Type: Individual Therapy  Reported Symptoms: depression, anxiety, sleep issues, panic attack, sleep issues  Mental Status Exam:  Appearance:   Casual     Behavior:  Appropriate  Motor:  Normal  Speech/Language:   Normal Rate  Affect:  Flat  Mood:  sad  Thought process:  normal  Thought content:    WNL  Sensory/Perceptual disturbances:    WNL  Orientation:  oriented to person, place, time/date and situation  Attention:  Good  Concentration:  Good  Memory:  WNL  Fund of knowledge:   Good  Insight:    Good  Judgment:   Good  Impulse Control:  Good   Risk Assessment: Danger to Self:  No Self-injurious Behavior: No Danger to Others: No Duty to Warn:no Physical Aggression / Violence:No  Access to Firearms a concern: No  Gang Involvement:No   Subjective: Patient was present for session.  She shared that she got a new job and she was doing well with it but than started having physical issues that she realized were due to anxiety.  She stated she liked the job and did not know why she had the issues.  She went on to share she finally had to quit and than she went down a bad road and wasn't taking meds as directed, wasn't eating, and sleeping correctly which led to positive SI.  She stated she was doing better now and the +SI are gone at this time.  Patient went on to explain she feels that the issue was tied to all the things that were off with her.  Patient went on to explain she is continuing to have dizzy spells and has had some falls.  Patient shared that she fell on the way to appointment and had to have her ankle elevated in session.  Patient explained that she knows anxiety increases the difficulties with her physical condition but that they are not the reason for her physical condition.   Patient stated that at this time all that she can do is drink lots of water and increase her sodium.  Patient shared she is very frustrated with the whole situation.  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  Patient was encouraged to try and focus on ways to decreasing her anxiety to see if that helps to decrease some of the symptoms.  Patient was encouraged to recognize that she has difficulty seeing small things in the situation she sees the whole picture gets overwhelmed and then struggles.  CBT skills to help talk herself through that were discussed with patient.  Patient was encouraged to recognize the importance of her thoughts and telling herself the right things and breaking things down into smaller pieces so that she does not have an increase in anxiety.  Interventions: Cognitive Behavioral Therapy and Solution-Oriented/Positive Psychology  Diagnosis:   ICD-10-CM   1. Major depressive disorder, recurrent episode, moderate ([facility])  F33.1     Plan: Patient is to utilize CBT skills to help decrease depression and anxiety symptoms.  Patient is to work on trying to get things into manageable pieces so that she can decrease her anxiety so that she can function better and decrease  her depression.  Patient is to work on going back to school to try and find a change in careers to see if that will help her have something she can do and that is not impacted by her physical issues when she gets the dizzy spells. Long-term goal: Develop healthy cognitive patterns and believes about self in the world that lead to alleviation and help prevent the relapse of depression symptoms Short-term goal: Verbally identify if possible to source of depressed mood  Stevphen Meuse, [facility]

## 2019-08-29 ENCOUNTER — Other Ambulatory Visit: Payer: Self-pay | Admitting: Psychiatry

## 2019-08-29 DIAGNOSIS — F3342 Major depressive disorder, recurrent, in full remission: Secondary | ICD-10-CM

## 2019-09-03 ENCOUNTER — Other Ambulatory Visit: Payer: Self-pay | Admitting: Psychiatry

## 2019-09-03 ENCOUNTER — Encounter: Payer: Self-pay | Admitting: Psychiatry

## 2019-09-03 ENCOUNTER — Telehealth (INDEPENDENT_AMBULATORY_CARE_PROVIDER_SITE_OTHER): Payer: Self-pay | Admitting: Psychiatry

## 2019-09-03 DIAGNOSIS — F411 Generalized anxiety disorder: Secondary | ICD-10-CM

## 2019-09-03 DIAGNOSIS — F3342 Major depressive disorder, recurrent, in full remission: Secondary | ICD-10-CM

## 2019-09-03 MED ORDER — ALPRAZOLAM 0.5 MG PO TABS: 0.5000 mg | ORAL_TABLET | ORAL | 2 refills | Status: DC | PRN

## 2019-09-03 MED ORDER — LAMOTRIGINE 200 MG PO TABS: 200.0000 mg | ORAL_TABLET | Freq: Every day | ORAL | 1 refills | Status: DC

## 2019-09-03 MED ORDER — TRAZODONE HCL 100 MG PO TABS: 100.0000 mg | ORAL_TABLET | Freq: Every day | ORAL | 1 refills | Status: DC

## 2019-09-03 MED ORDER — BUSPIRONE HCL 15 MG PO TABS: ORAL_TABLET | ORAL | 2 refills | Status: DC

## 2019-09-03 MED ORDER — ESCITALOPRAM OXALATE 20 MG PO TABS: 20.0000 mg | ORAL_TABLET | Freq: Every day | ORAL | 1 refills | Status: DC

## 2019-09-03 MED FILL — SUBVENITE 200 MG TABS: 200 | 90 days supply | Qty: 90 | Fill #0

## 2019-09-03 MED FILL — busPIRone HCL 15 MG TABS: 15 | 37 days supply | Qty: 60 | Fill #0

## 2019-09-03 MED FILL — ALPRAZolam 0.5 MG TABS: 0.5 | 30 days supply | Qty: 30 | Fill #0

## 2019-09-03 MED FILL — traZODone HCL 100 MG TABS: 100 | 90 days supply | Qty: 90 | Fill #0

## 2019-09-03 NOTE — Progress Notes (Signed)
[name] [id number] 1990-06-21 29 y.o.  Virtual Visit via Telephone Note  I connected with pt on 09/03/19 at  2:30 PM EDT by telephone and verified that I am speaking with the correct person using two identifiers.   I discussed the limitations, risks, security and privacy concerns of performing an evaluation and management service by telephone and the availability of in person appointments. I also discussed with the patient that there may be a patient responsible charge related to this service. The patient expressed understanding and agreed to proceed.   I discussed the assessment and treatment plan with the patient. The patient was provided an opportunity to ask questions and all were answered. The patient agreed with the plan and demonstrated an understanding of the instructions.   The patient was advised to call back or seek an in-person evaluation if the symptoms worsen or if the condition fails to improve as anticipated.  I provided 30 minutes of non-face-to-face time during this encounter.  The patient was located at home.  The provider was located at [facility].   [name], PMHNP   Subjective:   Patient ID:  [name] is a 29 y.o. (DOB 02-Nov-1990) female.  Chief Complaint: No chief complaint on file.   HPI [name] presents for follow-up of anxiety, insomnia, and depression. She reports that she has been more stressed and having thoughts of self-harm. Reports that school started back last week and she has had panic attacks since then. Has had vomiting on a few occasions. Vomiting stopped when she stopped working. Has also had increased dizziness. She reports having panic attacks at least once a day. Has had increased worry and anxious thoughts. She reports that she sometimes feels as if she does not fit in. Notices some depression and reports that it has been "on and off." Denies irritability. She reports that her sleep has been disrupted and reports that  she is anxious about going to sleep. Denies nightmares. Reports that once she is able to get to sleep she may sleep 10-11 hours a night unless she has to wake up for something. She reports long-standing altered sleep cycle. Appetite has been ok. Reports that she is tired some days and that energy is ok overall. Motivation is ok. Reports adequate concentration. Has had occasional passive death wishes. Denies SI. Some self-injurious ideation.   Reports that she has difficulty keeping a job due to anxiety.   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   Rarely taking Xanax prn. Reports that she is almost out of Lamictal but not has not had a recent lapse   Past Psychiatric Medication Trials: Xanax Lamictal Lexapro Trintellix-allergic reaction Daytrana-Rash Ritalin Evekeo Concerta Vyvanse Sertraline Cymbalta- orthostasis, vision changes Prozac Wellbutrin Trazodone Remeron  Review of Systems:  Review of Systems  HENT: Positive for sore throat.   Musculoskeletal: Negative for gait problem.  Neurological: Positive for dizziness and light-headedness.  Psychiatric/Behavioral:       Please refer to HPI    Medications: I have reviewed the patient's current medications.  Current Outpatient Medications  Medication Sig Dispense Refill   escitalopram (LEXAPRO) 20 MG tablet Take 1 tablet (20 mg total) by mouth daily. 90 tablet 1   Meclizine HCl (BONINE PO) Take by mouth daily as needed.      midodrine (PROAMATINE) 5 MG tablet Take 1 tablet (5 mg total) by mouth 2 (two) times daily with a meal. 180 tablet 3  Multiple Vitamins-Minerals (MULTIVITAL) tablet Take 1 tablet by mouth daily. Gummy     naproxen (NAPROSYN) 250 MG tablet Take by mouth 2 (two) times daily as needed.      Naproxen Sodium (ALEVE PO) Take 2 tablets by mouth daily as needed (for pain).     traZODone (DESYREL) 100 MG tablet Take 1 tablet (100 mg total) by mouth at bedtime. 90  tablet 1   ALPRAZolam (XANAX) 0.5 MG tablet Take 1 tablet (0.5 mg total) by mouth as needed for anxiety or sleep. 30 tablet 2   benzonatate (TESSALON) 100 MG capsule Take 1 capsule (100 mg total) by mouth every 8 (eight) hours. (Patient not taking: Reported on 09/03/2019) 21 capsule 0   busPIRone (BUSPAR) 15 MG tablet Take 1/3 tablet p.o. twice daily for 1 week, then take 2/3 tablet p.o. twice daily for 1 week, then take 1 tablet p.o. twice daily 60 tablet 2   lamoTRIgine (LAMICTAL) 200 MG tablet Take 1 tablet (200 mg total) by mouth daily after supper. 90 tablet 1   No current facility-administered medications for this visit.    Medication Side Effects: None  Allergies:  Allergies  Allergen Reactions   Cymbalta [Duloxetine Hcl] Other (See Comments)    Hypotension   Daytrana [Methylphenidate] Rash    Like a sunburn   Duloxetine Other (See Comments)    Fainting    Past Medical History:  Diagnosis Date   ADHD (attention deficit hyperactivity disorder)    Anxiety    Depression    Environmental allergies    GERD (gastroesophageal reflux disease)    Myopia of both eyes    Obesity    Syncope and collapse     Family History  Problem Relation Age of Onset   Bipolar disorder Father    Diabetes Father    Hyperlipidemia Father    Bipolar disorder Brother    Depression Mother    Hyperlipidemia Mother    Diabetes Maternal Grandmother    Heart disease Maternal Grandmother    Hyperlipidemia Maternal Grandmother    Hypertension Maternal Grandmother    Heart disease Maternal Grandfather    Hyperlipidemia Maternal Grandfather    Heart disease Paternal Grandmother    Hypertension Paternal Grandmother    Cancer Paternal Grandfather    Diabetes Paternal Grandfather    Bipolar disorder Paternal Aunt     Social History   Socioeconomic History   Marital status: Single    Spouse name: Not on file   Number of children: Not on file   Years of  education: Not on file   Highest education level: Not on file  Occupational History   Occupation: Student  Tobacco Use   Smoking status: Never Smoker   Smokeless tobacco: Never Used  Substance and Sexual Activity   Alcohol use: Yes    Alcohol/week: 1.0 standard drink    Types: 1 Glasses of wine per week    Comment: Occasionaly   Drug use: No   Sexual activity: Never    Birth control/protection: Pill  Other Topics Concern   Not on file  Social History Narrative   07/05/12 AHW  Sharmila was born in [place], [place] and she currently lives with her mother, father, and her younger brother. She has an older brother who lives in [place]. [place]. She graduated high school. She is currently attending [employer] studying biology. She plans to return to [employer] to complete a degree in marine biology.  She is currently unemployed. 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  She denies any history of legal problems. She reports that her social support system consists of her therapist. She enjoys playing video games, but watching, cocaine, sewing, white in nature documentaries, and hiking. 07/05/12 AHW      Regular exercise: no   Caffeine use: no   Social Determinants of Health   Financial Resource Strain:    Difficulty of Paying Living Expenses: Not on file  Food Insecurity:    Worried About Programme researcher, broadcasting/film/video in the Last Year: Not on file   [employer] of Food in the Last Year: Not on file  Transportation Needs:    Lack of Transportation (Medical): Not on file   Lack of Transportation (Non-Medical): Not on file  Physical Activity:    Days of Exercise per Week: Not on file   Minutes of Exercise per Session: Not on file  Stress:    Feeling of Stress : Not on file  Social Connections:    Frequency of Communication with Friends and Family: Not on file   Frequency of Social Gatherings with Friends and Family: Not on file   Attends Religious Services: Not on file   Active Member  of Clubs or Organizations: Not on file   Attends Banker Meetings: Not on file   Marital Status: Not on file  Intimate Partner Violence:    Fear of Current or Ex-Partner: Not on file   Emotionally Abused: Not on file   Physically Abused: Not on file   Sexually Abused: Not on file    Past Medical History, Surgical history, Social history, and Family history were reviewed and updated as appropriate.   Please see review of systems for further details on the patient's review from today.   Objective:   Physical Exam:  There were no vitals taken for this visit.  Physical Exam Neurological:     Mental Status: She is alert and oriented to person, place, and time.     Cranial Nerves: No dysarthria.  Psychiatric:        Attention and Perception: Attention and perception normal.        Mood and Affect: Mood is anxious and depressed.        Speech: Speech normal.        Behavior: Behavior is cooperative.        Thought Content: Thought content normal. Thought content is not paranoid or delusional. Thought content does not include homicidal or suicidal ideation. Thought content does not include homicidal or suicidal plan.        Cognition and Memory: Cognition and memory normal.        Judgment: Judgment normal.     Comments: Insight intact     Lab Review:  No results found for: NA, K, CL, CO2, GLUCOSE, BUN, CREATININE, CALCIUM, PROT, ALBUMIN, AST, ALT, ALKPHOS, BILITOT, GFRNONAA, GFRAA  No results found for: WBC, RBC, HGB, HCT, PLT, MCV, MCH, MCHC, RDW, LYMPHSABS, MONOABS, EOSABS, BASOSABS  No results found for: POCLITH, LITHIUM   No results found for: PHENYTOIN, PHENOBARB, VALPROATE, CBMZ   .res Assessment: Plan:   Discussed potential benefits, risks, and side effects of Buspar. Start BuSpar 15 mg 1/3 tablet twice daily for 1 week, then increase to 2/3 tablet twice daily for 1 week, then increase to 1 tablet twice daily for anxiety. Continue Lexapro 20 mg po qd  for depression and anxiety. Continue Trazodone for insomnia.  Continue Xanax prn anxiety.  Recommend [facility]  [facility]. Pt to f/u in 3 months or sooner if clinically indicated.  Patient advised to contact office with any questions, adverse effects, or acute worsening in signs and symptoms.  Diagnoses and all orders for this visit:  Recurrent major depressive disorder, in full remission (HCC) -     lamoTRIgine (LAMICTAL) 200 MG tablet; Take 1 tablet (200 mg total) by mouth daily after supper. -     escitalopram (LEXAPRO) 20 MG tablet; Take 1 tablet (20 mg total) by mouth daily.  Anxiety state -     busPIRone (BUSPAR) 15 MG tablet; Take 1/3 tablet p.o. twice daily for 1 week, then take 2/3 tablet p.o. twice daily for 1 week, then take 1 tablet p.o. twice daily -     ALPRAZolam (XANAX) 0.5 MG tablet; Take 1 tablet (0.5 mg total) by mouth as needed for anxiety or sleep.  Other orders -     traZODone (DESYREL) 100 MG tablet; Take 1 tablet (100 mg total) by mouth at bedtime.    Please see After Visit Summary for patient specific instructions.  Future Appointments  Date Time Provider Department Center  09/24/2019  3:00 PM Stevphen Meusengram, [place] None  10/15/2019  3:00 PM [facility]phen Meusengram, [place]  11/05/2019  3:00 PM Stevphen Meusengram, [place], [facility] None  11/26/2019  3:00 PM Ste[facility]phen MeuseIngram, [place], [facility]    No orders of the defined types were placed in this encounter.     -------------------------------

## 2019-09-14 ENCOUNTER — Other Ambulatory Visit: Payer: Self-pay

## 2019-09-14 DIAGNOSIS — Z20822 Contact with and (suspected) exposure to covid-19: Secondary | ICD-10-CM

## 2019-09-15 LAB — NOVEL CORONAVIRUS, NAA: SARS-CoV-2, NAA: NOT DETECTED

## 2019-09-15 LAB — SPECIMEN STATUS REPORT

## 2019-09-24 ENCOUNTER — Other Ambulatory Visit: Payer: Self-pay

## 2019-09-24 ENCOUNTER — Ambulatory Visit (INDEPENDENT_AMBULATORY_CARE_PROVIDER_SITE_OTHER): Payer: Self-pay | Admitting: Psychiatry

## 2019-09-24 DIAGNOSIS — F331 Major depressive disorder, recurrent, moderate: Secondary | ICD-10-CM

## 2019-09-24 NOTE — Progress Notes (Signed)
[facility] Counselor/Therapist Progress Note  Patient ID: [name], MRN: [id number],    Date: 09/24/2019  Time Spent: 50 minutes start time 3:05 PM end time 3:55 PM  Treatment Type: Individual Therapy  Reported Symptoms: anxiety, depression, low motivation, sleep issues  Mental Status Exam:  Appearance:   Casual and Neat     Behavior:  Appropriate  Motor:  Normal  Speech/Language:   Normal Rate  Affect:  Appropriate  Mood:  normal  Thought process:  normal  Thought content:    WNL  Sensory/Perceptual disturbances:    WNL  Orientation:  oriented to person, place, time/date and situation  Attention:  Good  Concentration:  Good  Memory:  WNL  Fund of knowledge:   Good  Insight:    Fair  Judgment:   Fair  Impulse Control:  Good   Risk Assessment: Danger to Self:  No Self-injurious Behavior: No Danger to Others: No Duty to Warn:no Physical Aggression / Violence:No  Access to Firearms a concern: No  Gang Involvement:No   Subjective: Patient was present for session.  She shared she has had a sinus infection. She is in school and it is going okay. She is enjoying it overall but there is a lot of work with it.  She  has good grades currently which feels very positive to her.  Patient shared she is still feeling lots of sadness but things are getting better.  She is able to acknowledge she is still mourning to work and now that she is healthy has some potential jobs that she is going to look at.  Patient stated she is fearful her anxiety is getting keep her from being able to function at a full-time job.  Patient acknowledges that when she gets anxious and impacts her physically and she is concerned that that is going to be an issue.  Encourage patient to focus on what is currently going on and to discuss and think through different ways to manage emotions more appropriately.  Patient reported that when she meditates she recognizes her mood is better and her anxiety is  lower.  Patient was given handouts from an anxiety book that would help her practice different types of meditation, progressive muscle relaxation, and diaphragmatic breathing exercises.  Patient was taught the technique that helps with the emotional response loop in the brain.  She was encouraged to practice that regularly.  Patient was also encouraged to remind herself that her thoughts lead to feelings lead to behaviors and to focus on just recognizing that she is enough and she can do it.  Patient agreed to practice exercises from session.  Discussed possibly trying some EFT tapping at next session.     Interventions: Cognitive Behavioral Therapy and Solution-Oriented/Positive Psychology  Diagnosis:   ICD-10-CM   1. Major depressive disorder, recurrent episode, moderate (HCC)  F33.1     Plan: Patient is to use CBT and coping skills to decrease depression symptoms.  Patient is to utilize handouts on meditation, progressive muscle relaxation, and diaphragmatic breathing exercises as well as techniques practiced in session to help keep her out of better place to decrease her depression symptoms.  Patient is to continue pursuing possible work opportunities. Long-term goal: Develop healthy cognitive patterns and believes about self in the world that lead to alleviation and help prevent the relapse of depression symptoms Short-term goal: Verbally identify possible the source of depressed mood  Stevphen Meuse, [facility]

## 2019-10-02 ENCOUNTER — Other Ambulatory Visit: Payer: Self-pay

## 2019-10-02 ENCOUNTER — Other Ambulatory Visit: Payer: Self-pay | Admitting: Radiology

## 2019-10-02 DIAGNOSIS — Z20822 Contact with and (suspected) exposure to covid-19: Secondary | ICD-10-CM

## 2019-10-04 LAB — NOVEL CORONAVIRUS, NAA: SARS-CoV-2, NAA: NOT DETECTED

## 2019-10-04 LAB — SARS-COV-2, NAA 2 DAY TAT

## 2019-10-08 MED FILL — BUSPIRONE HCL 15 MG TABS: 15 | 37 days supply | Qty: 60 | Fill #1

## 2019-10-15 ENCOUNTER — Ambulatory Visit: Payer: Self-pay | Admitting: Psychiatry

## 2019-10-22 MED FILL — ESCITALOPRAM 20 MG TABLET: 20 | 90 days supply | Qty: 90 | Fill #0

## 2019-10-30 ENCOUNTER — Telehealth: Payer: Self-pay | Admitting: Psychiatry

## 2019-10-30 NOTE — Telephone Encounter (Signed)
Megan called and LM today. She is on Buspar and for the past week has had anxiety attacks every day which last several hours. Could this be a side effect or problem from Buspar?

## 2019-10-31 NOTE — Telephone Encounter (Signed)
Rtc to patient and advised her to stop Buspar for right now to see if it helps her symptoms. Advised to take Xanax as needed in the interim. Instructed her to call back with an update or if worsening symptoms. She agreed.

## 2019-10-31 NOTE — Telephone Encounter (Signed)
If the panic attacks are new since starting the buspirone then stop it.  She can stop it abruptly.  Buspirone is helpful for general anxiety but is not always helpful for panic attacks.  She may need to discuss changing the Lexapro to either sertraline or paroxetine which tend to be the most effective meds for panic attacks in the average person.  However those meds do not work quickly and it would be best to defer this decision to [place].  She can use the Xanax as needed until Shanda Bumps returns.

## 2019-10-31 NOTE — Telephone Encounter (Signed)
Rtc to patient, she reports has anxiety/panic attacks lasting all day long for the past 1 1/2 weeks. She was started on Buspar 15 mg end of August, currently taking 15 mg bid. She's not sure if this is making things worse? She does have prn Xanax which she takes occasionally but doesn't want to rely on something addicting. No other changes noted.   Informed her Shanda Bumps was out of office this week but would discuss symptoms with Dr. Jennelle Human and follow up with a recommendation.

## 2019-11-05 ENCOUNTER — Ambulatory Visit (INDEPENDENT_AMBULATORY_CARE_PROVIDER_SITE_OTHER): Payer: Self-pay | Admitting: Psychiatry

## 2019-11-05 ENCOUNTER — Other Ambulatory Visit: Payer: Self-pay

## 2019-11-05 DIAGNOSIS — F331 Major depressive disorder, recurrent, moderate: Secondary | ICD-10-CM

## 2019-11-05 NOTE — Progress Notes (Signed)
[facility] Counselor/Therapist Progress Note  Patient ID: [name], MRN: [id number],    Date: 11/05/2019  Time Spent: 51 minutes start time 3:09 PM end time 4 PM  Treatment Type: Individual Therapy  Reported Symptoms: panic attacks, depression, sleep issues, anxiety, focusing issues, low motivation  Mental Status Exam:  Appearance:   Fairly Groomed     Behavior:  Appropriate  Motor:  Normal  Speech/Language:   Normal Rate  Affect:  Labile  Mood:  sad  Thought process:  normal  Thought content:    WNL  Sensory/Perceptual disturbances:    WNL  Orientation:  oriented to person, place, time/date and situation  Attention:  Good  Concentration:  Good  Memory:  WNL  Fund of knowledge:   Fair  Insight:    Fair  Judgment:   Fair  Impulse Control:  Good   Risk Assessment: Danger to Self:  none current but having intrusive thoughts at time Self-injurious Behavior: No Danger to Others: No Duty to Warn:no Physical Aggression / Violence:No  Access to Firearms a concern: No  Gang Involvement:No   Subjective: Patient was present for session.  She shared that she is not doing well.  She stated her anxiety is awful and she is having lots of panic attacks.  She shared that she is behind in school work and that is overwhelming to her.  She also stated that her mother is ready for a divorce but it is not looking like she will be able to do anything anytime soon.  Patient admitted that the stress of finances and knowing she needs to get to class are very overwhelming and she feels some of that is shutting her down.  She explained she feels that she needs to help her mother but is not sure how to because whenever she gets too stressed her health goes down and then she has to quit whenever job she is started.  Patient was encouraged to break things down into take things 1 step at a time.  At this point she seems to like the course work she is in and if she gets through the classes she  will be able to get a certification and do some work.  Patient was encouraged to focus on that first and then go from there.  Different strategies to help her stay focused on her schoolwork were discussed in session.  The importance of getting into a routine and breaking things down into manageable pieces so they do not get overwhelming were addressed with patient.  Was also encouraged to work on her self talk to keep her focused where she needs to put her energy.  Patient shared she has been communicating with a friend and that is helpful so she is going to continue to do that to try and get things going at a good direction as well.  Patient also was encouraged to do the handouts and practice meditation and diaphragmatic breathing that had been discussed with her in previous sessions.  Patient decided that she would get the anxiety and phobia workbook to start practicing tools from there outside of session.  Interventions: Cognitive Behavioral Therapy and Solution-Oriented/Positive Psychology  Diagnosis:   ICD-10-CM   1. Major depressive disorder, recurrent episode, moderate (HCC)  F33.1     Plan: Patient is to use CBT and coping skills to decrease depression and anxiety symptoms.  Patient is to start working in the anxiety and phobia workbook since there are skills in  the handouts that were applied to depression and anxiety.  Patient is to follow plan from session to complete her schoolwork. Long-term goal: Develop healthy cognitive patterns and beliefs about self in the world that lead to alleviation and help prevent the relapse of depression symptoms Short-term goal: Verbally identify if possible to source of depressed mood  Stevphen Meuse, [facility]

## 2019-11-05 NOTE — Telephone Encounter (Signed)
Noted  

## 2019-11-11 ENCOUNTER — Other Ambulatory Visit: Payer: Self-pay | Admitting: Physician Assistant

## 2019-11-26 ENCOUNTER — Ambulatory Visit: Payer: Self-pay | Admitting: Psychiatry

## 2019-12-03 MED FILL — SUBVENITE 200 MG TABS: 200 | 90 days supply | Qty: 90 | Fill #1

## 2019-12-05 ENCOUNTER — Ambulatory Visit: Payer: Self-pay | Admitting: Student

## 2019-12-12 ENCOUNTER — Ambulatory Visit (INDEPENDENT_AMBULATORY_CARE_PROVIDER_SITE_OTHER): Payer: Self-pay | Admitting: Student

## 2019-12-12 ENCOUNTER — Ambulatory Visit (INDEPENDENT_AMBULATORY_CARE_PROVIDER_SITE_OTHER): Payer: Self-pay | Admitting: Psychiatry

## 2019-12-12 ENCOUNTER — Other Ambulatory Visit: Payer: Self-pay

## 2019-12-12 ENCOUNTER — Encounter: Payer: Self-pay | Admitting: Student

## 2019-12-12 VITALS — BP 107/74 | HR 86 | Ht 62.0 in | Wt 202.8 lb

## 2019-12-12 DIAGNOSIS — G909 Disorder of the autonomic nervous system, unspecified: Secondary | ICD-10-CM

## 2019-12-12 DIAGNOSIS — R55 Syncope and collapse: Secondary | ICD-10-CM

## 2019-12-12 DIAGNOSIS — F331 Major depressive disorder, recurrent, moderate: Secondary | ICD-10-CM

## 2019-12-12 MED ORDER — MIDODRINE HCL 5 MG PO TABS: 5.0000 mg | ORAL_TABLET | Freq: Two times a day (BID) | ORAL | 3 refills | Status: DC

## 2019-12-12 NOTE — Progress Notes (Signed)
PCP:  Juluis Rainier, MD Primary Cardiologist: No primary care provider on file. Electrophysiologist: Mary Bunting, MD   [name] is a 29 y.o. female seen today for Mary Bunting, MD for routine electrophysiology followup.  Since last being seen in our clinic the patient reports doing about the same. She has lightheadedness with rapid standing or sitting up on a daily basis. She has felt better overall on midodrine. she denies chest pain,  dyspnea, PND, orthopnea, nausea, vomiting, syncope, edema, weight gain, or early satiety. She attempts to stay hydrated and liberalizes her sodium intake. Drinks pedialyte at times. In school for IT work.   Past Medical History:  Diagnosis Date  . ADHD (attention deficit hyperactivity disorder)   . Anxiety   . Depression   . Environmental allergies   . GERD (gastroesophageal reflux disease)   . Myopia of both eyes   . Obesity   . Syncope and collapse    History reviewed. No pertinent surgical history.  Current Outpatient Medications  Medication Sig Dispense Refill  . ALPRAZolam (XANAX) 0.5 MG tablet Take 1 tablet (0.5 mg total) by mouth as needed for anxiety or sleep. 30 tablet 2  . escitalopram (LEXAPRO) 20 MG tablet Take 1 tablet (20 mg total) by mouth daily. 90 tablet 1  . lamoTRIgine (LAMICTAL) 200 MG tablet Take 1 tablet (200 mg total) by mouth daily after supper. 90 tablet 1  . LORATADINE PO Take by mouth.    . Meclizine HCl (BONINE PO) Take by mouth daily as needed.     . midodrine (PROAMATINE) 5 MG tablet Take 1 tablet (5 mg total) by mouth 2 (two) times daily with a meal. 180 tablet 3  . Multiple Vitamins-Minerals (MULTIVITAL) tablet Take 1 tablet by mouth daily. Gummy    . Naproxen Sodium (ALEVE PO) Take 2 tablets by mouth daily as needed (for pain).    . traZODone (DESYREL) 100 MG tablet Take 1 tablet (100 mg total) by mouth at bedtime. 90 tablet 1   No current facility-administered medications for this visit.    Allergies    Allergen Reactions  . Cymbalta [Duloxetine Hcl] Other (See Comments)    Hypotension  . Daytrana [Methylphenidate] Rash    Like a sunburn  . Duloxetine Other (See Comments)    Fainting    Social History   Socioeconomic History  . Marital status: Single    Spouse name: Not on file  . Number of children: Not on file  . Years of education: Not on file  . Highest education level: Not on file  Occupational History  . Occupation: Consulting civil engineer  Tobacco Use  . Smoking status: Never Smoker  . Smokeless tobacco: Never Used  Substance and Sexual Activity  . Alcohol use: Yes    Alcohol/week: 1.0 standard drink    Types: 1 Glasses of wine per week    Comment: Occasionaly  . Drug use: No  . Sexual activity: Never    Birth control/protection: Pill  Other Topics Concern  . Not on file  Social History Narrative   07/05/12 AHW  [name] was born in [place], [place] and she currently lives with her mother, father, and her younger brother. She has an older brother who lives in [place]. [place]. She graduated high school. She is currently attending [employer] studying biology. She plans to return to [employer] to complete a degree in marine biology.  She is currently unemployed. 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  She denies any  history of legal problems. She reports that her social support system consists of her therapist. She enjoys playing video games, but watching, cocaine, sewing, white in nature documentaries, and hiking. 07/05/12 AHW      Regular exercise: no   Caffeine use: no   Social Determinants of Health   Financial Resource Strain:   . Difficulty of Paying Living Expenses: Not on file  Food Insecurity:   . Worried About Programme researcher, broadcasting/film/video in the Last Year: Not on file  . Ran Out of Food in the Last Year: Not on file  Transportation Needs:   . Lack of Transportation (Medical): Not on file  . Lack of Transportation (Non-Medical): Not on file  Physical Activity:   . Days of  Exercise per Week: Not on file  . Minutes of Exercise per Session: Not on file  Stress:   . Feeling of Stress : Not on file  Social Connections:   . Frequency of Communication with Friends and Family: Not on file  . Frequency of Social Gatherings with Friends and Family: Not on file  . Attends Religious Services: Not on file  . Active Member of Clubs or Organizations: Not on file  . Attends Banker Meetings: Not on file  . Marital Status: Not on file  Intimate Partner Violence:   . Fear of Current or Ex-Partner: Not on file  . Emotionally Abused: Not on file  . Physically Abused: Not on file  . Sexually Abused: Not on file     Review of Systems: General: No chills, fever, night sweats or weight changes  Cardiovascular:  No chest pain, dyspnea on exertion, edema, orthopnea, palpitations, paroxysmal nocturnal dyspnea Dermatological: No rash, lesions or masses Respiratory: No cough, dyspnea Urologic: No hematuria, dysuria Abdominal: No nausea, vomiting, diarrhea, bright red blood per rectum, melena, or hematemesis Neurologic: No visual changes, weakness, changes in mental status All other systems reviewed and are otherwise negative except as noted above.  Physical Exam: Vitals:   12/12/19 0859  BP: 107/74  Pulse: 86  SpO2: 98%  Weight: 202 lb 12.8 oz (92 kg)  Height: 5\' 2"  (1.575 m)   Orthostatic VS for the past 24 hrs (Last 3 readings):  BP- Lying Pulse- Lying BP- Sitting Pulse- Sitting BP- Standing at 0 minutes Pulse- Standing at 0 minutes BP- Standing at 3 minutes Pulse- Standing at 3 minutes  12/12/19 0910 104/72 74 107/74 82 105/71 84 106/76 85    GEN- The patient is well appearing, alert and oriented x 3 today.   HEENT: normocephalic, atraumatic; sclera clear, conjunctiva pink; hearing intact; oropharynx clear; neck supple, no JVP Lymph- no cervical lymphadenopathy Lungs- Clear to ausculation bilaterally, normal work of breathing.  No wheezes, rales,  rhonchi Heart- Regular rate and rhythm, no murmurs, rubs or gallops, PMI not laterally displaced GI- soft, non-tender, non-distended, bowel sounds present, no hepatosplenomegaly Extremities- no clubbing, cyanosis, or edema; DP/PT/radial pulses 2+ bilaterally MS- no significant deformity or atrophy Skin- warm and dry, no rash or lesion Psych- euthymic mood, full affect Neuro- strength and sensation are intact  EKG is not ordered. Personal review of EKG from 07/2019 shows NSR 76 bpm, PR 144 ms, QRS 80 ms  Additional studies reviewed include: Previous EP notes  Assessment and Plan:  1. Autonomic dysfunction 2. Near syncope Pt denies frank syncope.  Refill midodrine 5mg  BID.   Orthostatics today are negative, though she had intermittent dizziness during.   She mentioned at times she feels lightheaded,  dizzy with quick movements of her head as well.  ? If she may also have a vertigo component to her symptoms. Previously discussed. Recommended PCP/Neuro assessment.   Reiterated safety and strategies.   Should consider compression hose/suppor stockings Avoid long standing.  Avoid stress where possible Avoid hot environments.  She does an excellent job at symptom recognition and sitting/lying down to avoid symptoms.   RTC 1 year by pt request.    Graciella Freer, PA-C  12/12/19 9:53 AM

## 2019-12-12 NOTE — Patient Instructions (Signed)
Medication Instructions:  *If you need a refill on your cardiac medications before your next appointment, please call your pharmacy*  Follow-Up: At [facility], you and your health needs are our priority.  As part of our continuing mission to provide you with exceptional heart care, we have created designated Provider Care Teams.  These Care Teams include your primary Cardiologist (physician) and Advanced Practice Providers (APPs -  Physician Assistants and Nurse Practitioners) who all work together to provide you with the care you need, when you need it.  We recommend signing up for the patient portal called "MyChart".  Sign up information is provided on this After Visit Summary.  MyChart is used to connect with patients for Virtual Visits (Telemedicine).  Patients are able to view lab/test results, encounter notes, upcoming appointments, etc.  Non-urgent messages can be sent to your provider as well.   To learn more about what you can do with MyChart, go to [email or web address].    Your next appointment:   Your physician wants you to follow-up in: 1 YEAR with Dr. Taylor. You will receive a reminder letter in the mail two months in advance. If you don't receive a letter, please call our office to schedule the follow-up appointment.  The format for your next appointment:   In Person with Gregg Taylor, MD     

## 2019-12-12 NOTE — Progress Notes (Signed)
[facility] Counselor/Therapist Progress Note  Patient ID: [name], MRN: [id number],    Date: 12/12/2019  Time Spent: 50 minutes start time 11:00 AM end time 11:50 AM Virtual Visit via Telephone Note Connected with patient by a video enabled telemedicine/telehealth application, with their informed consent, and verified patient privacy and that I am speaking with the correct person using two identifiers. I discussed the limitations, risks, security and privacy concerns of performing psychotherapy and management service by telephone and the availability of in person appointments. I also discussed with the patient that there may be a patient responsible charge related to this service. The patient expressed understanding and agreed to proceed. I discussed the treatment planning with the patient. The patient was provided an opportunity to ask questions and all were answered. The patient agreed with the plan and demonstrated an understanding of the instructions. The patient was advised to call  our office if  symptoms worsen or feel they are in a crisis state and need immediate contact.   Therapist Location: office Patient Location: home    Treatment Type: Individual Therapy  Reported Symptoms: anxiety, sadness, panic attacks, sleep issues, fatigue  Mental Status Exam:  Appearance:   Casual     Behavior:  Appropriate  Motor:  Normal  Speech/Language:   Normal Rate  Affect:  Flat  Mood:  normal  Thought process:  normal  Thought content:    WNL  Sensory/Perceptual disturbances:    WNL  Orientation:  oriented to person, place, time/date and situation  Attention:  Good  Concentration:  Good  Memory:  WNL  Fund of knowledge:   Good  Insight:    Good  Judgment:   Good  Impulse Control:  Good   Risk Assessment: Danger to Self:  No Self-injurious Behavior: No Danger to Others: No Duty to Warn:no Physical Aggression / Violence:No  Access to Firearms a concern: No  Gang  Involvement:No   Subjective: Met with patient via virtual session. She shared that she has been able to get caught back up on her school work and that has helped to decrease panic attacks.  She shared she is doing on line courses next semester. She shared that she had to get her mother to bring her home after her cardiologist appointment due to the tests that she had to take there.  She shared that she went back to her home 1 day last week and it was real bad.  She shared her dad isn't doing anything to clean the house.  Her mom finally told him she wasn't coming home.  Patient reported that she is relieved that they aren't  Going back the home.  Patient was encouraged to focus on the things that she can do to keep moving in a positive direction.  She was encouraged to feel good about the fact that she was able to complete work even while she was struggling.  Patient was encouraged to think through what she told herself to be able to get caught up on all the schoolwork.  Patient was able to acknowledge the fact she realized she just had to do it so she put that energy in and completed it.  Ways to continue that progress were discussed with patient in session.  Shared she wishes she could feel better more of the time but recognizes that she has to just continue on even though she will have the dizzy spells and the fatigue.  Patient again was encouraged to  focus on positives in clearing spending time with her nephew and that her mother is going forward with the divorce.  Interventions: Cognitive Behavioral Therapy and Solution-Oriented/Positive Psychology  Diagnosis:   ICD-10-CM   1. Major depressive disorder, recurrent episode, moderate ([place])  F33.1     Plan: Patient is to use CBT and coping skills to decrease depression symptoms.  Patient is to keep focusing on positive things including her nephew and that things are moving forward with her mother.  Patient is to continue working on small tasks completion  to be able to get schoolwork done that we will allow her to go to the next course. Long-term goal: Develop healthy cognitive patterns and beliefs about self in the world that lead to alleviation and help prevent the relapse of depression Short-term goal: Verbally identify if possible to source of depressed mood  [name], [facility]

## 2019-12-26 ENCOUNTER — Ambulatory Visit (INDEPENDENT_AMBULATORY_CARE_PROVIDER_SITE_OTHER): Payer: Self-pay | Admitting: Psychiatry

## 2019-12-26 ENCOUNTER — Other Ambulatory Visit: Payer: Self-pay

## 2019-12-26 DIAGNOSIS — F331 Major depressive disorder, recurrent, moderate: Secondary | ICD-10-CM

## 2019-12-26 NOTE — Progress Notes (Signed)
      [facility] Counselor/Therapist Progress Note  Patient ID: [name], MRN: [id number],    Date: 12/26/2019  Time Spent: 50 minutes start time 3:04 PM end time 3:54 PM  Treatment Type: Individual Therapy  Reported Symptoms: mood issues, depression, anxiety, focusing issues  Mental Status Exam:  Appearance:   Casual     Behavior:  Appropriate  Motor:  Normal  Speech/Language:   Normal Rate  Affect:  Appropriate  Mood:  normal  Thought process:  normal  Thought content:    WNL  Sensory/Perceptual disturbances:    WNL  Orientation:  oriented to person, place, time/date and situation  Attention:  Good  Concentration:  Good  Memory:  WNL  Fund of knowledge:   Good  Insight:    Good  Judgment:   Good  Impulse Control:  Good   Risk Assessment: Danger to Self:  No Self-injurious Behavior: No Danger to Others: No Duty to Warn:no Physical Aggression / Violence:No  Access to Firearms a concern: No  Gang Involvement:No   Subjective: Patient was present for session.  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  Patient explained her mood is still bad because she thinks too much with not having classes.  Discussed different ways that she can engage her brain in positive activity including trying to spend more time with her nephew which brings her joy.  Patient was also encouraged to think through ways that she can help her mother currently while she is off and work to try and get things ready for the next semester.  Patient was encouraged to feel good about the progress she is making and to see that she can accomplish goals.  Patient acknowledged she realizes she needs to move forward with her life but it is difficult with the dizziness that seems to come when she starts getting stressed.  Patient was encouraged to focus more on finding ways to release her stress appropriately and remind herself that she can do it and she is  enough.  Interventions: Cognitive Behavioral Therapy and Solution-Oriented/Positive Psychology  Diagnosis:   ICD-10-CM   1. Major depressive disorder, recurrent episode, moderate ([facility])  F33.1     Plan: Patient is to use CBT and coping skills to decrease depression symptoms.  Patient is to work on helping her mother in small increments around the apartment so that she can keep her brain engaged in positive activity.  Patient is to take medication as directed.  Patient is to continue engaging with peers online. Long-term goal: Develop healthy cognitive patterns and beliefs about self in the world that lead to alleviation and help prevent the relapse of depression symptoms Short-term goal: Verbally identify if possible to source of depressed mood  Stevphen Meuse, [facility]

## 2020-01-07 MED FILL — traZODone HCL 100 MG TABS: 100 | 90 days supply | Qty: 90 | Fill #1

## 2020-01-15 ENCOUNTER — Ambulatory Visit (INDEPENDENT_AMBULATORY_CARE_PROVIDER_SITE_OTHER): Payer: Self-pay | Admitting: Psychiatry

## 2020-01-15 DIAGNOSIS — F331 Major depressive disorder, recurrent, moderate: Secondary | ICD-10-CM
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
## 2020-01-18 ENCOUNTER — Ambulatory Visit: Payer: Self-pay

## 2020-01-19 MED FILL — ESCITALOPRAM 20 MG TABLET: 20 | 90 days supply | Qty: 90 | Fill #0

## 2020-02-18 ENCOUNTER — Ambulatory Visit (INDEPENDENT_AMBULATORY_CARE_PROVIDER_SITE_OTHER): Payer: Self-pay | Admitting: Psychiatry

## 2020-02-18 DIAGNOSIS — F330 Major depressive disorder, recurrent, mild: Secondary | ICD-10-CM

## 2020-02-18 NOTE — Progress Notes (Signed)
[facility] Counselor/Therapist Progress Note  Patient ID: [name], MRN: [id number],    Date: 02/18/2020  Time Spent: 50 minutes start time 3:10 PM end time 4 PM Virtual Visit via Telephone Note Connected with patient by a video enabled telemedicine/telehealth application, with their informed consent, and verified patient privacy and that I am speaking with the correct person using two identifiers. I discussed the limitations, risks, security and privacy concerns of performing psychotherapy and management service by telephone and the availability of in person appointments. I also discussed with the patient that there may be a patient responsible charge related to this service. The patient expressed understanding and agreed to proceed. I discussed the treatment planning with the patient. The patient was provided an opportunity to ask questions and all were answered. The patient agreed with the plan and demonstrated an understanding of the instructions. The patient was advised to call  our office if  symptoms worsen or feel they are in a crisis state and need immediate contact.   Therapist Location: office Patient Location: home    Treatment Type: Individual Therapy  Reported Symptoms: anxiety, sadness, frustration, dizzy spells, migraines, low motivation, sleep issues  Mental Status Exam:  Appearance:   Casual     Behavior:  Appropriate  Motor:  Normal  Speech/Language:   Normal Rate  Affect:  Appropriate  Mood:  labile  Thought process:  circumstantial  Thought content:    WNL  Sensory/Perceptual disturbances:    WNL  Orientation:  oriented to person, place, time/date and situation  Attention:  Good  Concentration:  Good  Memory:  WNL  Fund of knowledge:   Good  Insight:    Good  Judgment:   Good  Impulse Control:  Good   Risk Assessment: Danger to Self:  No Self-injurious Behavior: No Danger to Others: No Duty to Warn:no Physical Aggression / Violence:No   Access to Firearms a concern: No  Gang Involvement:No   Subjective: Met with patient via virtual session.  She shared she is very stressed with her class because he is having a hard time figuring out how to complete it.  Encouraged her to contact her professor to see what she can do to understand it better. Patient was able to share that she is having a hard time getting into a routine with studying with her sleep issues. Reminded her that she needs to develop a habit with her studying and getting up.  She decided that she would like to get up at 10:30 AM each day.  To start she will try to get up at 12 PM and than work her way  Back to 10:30 AM.  She is to study everyday from 1 to 3 PM each day to make it a habit.  Discussed importance of trying to be more proactive at trying to get help. She also shared that she is having to file taxes this year.  She stated she can get help from her school encouraged her to go ahead and get them filed through the school.  Patient was also encouraged to continue focusing on finding ways to help her mother.  Encouraged her to spend more time with her nephew since that seems to bring her a great deal of joy.  Interventions: Cognitive Behavioral Therapy and Solution-Oriented/Positive Psychology  Diagnosis:   ICD-10-CM   1. MDD (major depressive disorder), recurrent episode, mild ([place])  F33.0     Plan: Patient is to use CBT and coping  skills to decrease depression symptoms.  Patient is to work on following routine discussed in session to be able to complete homework and get on an appropriate sleep schedule for herself.  Patient is to take medication as directed. Long-term goal: Develop healthy cognitive patterns and beliefs about self in the world that lead to alleviation and help prevent the relapse of depression symptoms Short-term goal: Verbally identify possible the source of depressed mood  [name], [facility]

## 2020-02-26 ENCOUNTER — Other Ambulatory Visit: Payer: Self-pay | Admitting: Psychiatry

## 2020-02-26 DIAGNOSIS — F3342 Major depressive disorder, recurrent, in full remission: Secondary | ICD-10-CM

## 2020-02-26 MED FILL — SUBVENITE 200 MG TABS: 200 | 90 days supply | Qty: 90 | Fill #0

## 2020-03-19 ENCOUNTER — Ambulatory Visit (INDEPENDENT_AMBULATORY_CARE_PROVIDER_SITE_OTHER): Payer: Self-pay | Admitting: Psychiatry

## 2020-03-19 DIAGNOSIS — F330 Major depressive disorder, recurrent, mild: Secondary | ICD-10-CM

## 2020-03-19 NOTE — Progress Notes (Signed)
[facility] Counselor/Therapist Progress Note  Patient ID: [name], MRN: [id number],    Date: 03/19/2020  Time Spent: 46 minutes start time 3:02 PM end time 3:48 PM Virtual Visit via Telephone Note Connected with patient by a video enabled telemedicine/telehealth application, with their informed consent, and verified patient privacy and that I am speaking with the correct person using two identifiers. I discussed the limitations, risks, security and privacy concerns of performing psychotherapy and management service by telephone and the availability of in person appointments. I also discussed with the patient that there may be a patient responsible charge related to this service. The patient expressed understanding and agreed to proceed. I discussed the treatment planning with the patient. The patient was provided an opportunity to ask questions and all were answered. The patient agreed with the plan and demonstrated an understanding of the instructions. The patient was advised to call  our office if  symptoms worsen or feel they are in a crisis state and need immediate contact.   Therapist Location: office Patient Location: home    Treatment Type: Individual Therapy  Reported Symptoms: sadness, sleep issues, intrusive thoughts, anxiety, day and nights mixed up, focusing issues, fatigue  Mental Status Exam:  Appearance:   Casual     Behavior:  Appropriate  Motor:  Normal  Speech/Language:   Normal Rate  Affect:  Appropriate  Mood:  sad  Thought process:  normal  Thought content:    WNL  Sensory/Perceptual disturbances:    WNL  Orientation:  oriented to person, place, time/date and situation  Attention:  Good  Concentration:  Good  Memory:  WNL  Fund of knowledge:   Good  Insight:    Good  Judgment:   Good  Impulse Control:  Good   Risk Assessment: Danger to Self:  None current but she is having intrusive thoughts at times but stated nothing she would act on.   Self-injurious Behavior: No Danger to Others: No Duty to Warn:no Physical Aggression / Violence:No  Access to Firearms a concern: No  Gang Involvement:No   Subjective: Met with patient via virtual session.  She shared that she has gotten her day and nights mixed up again and it is bothering her.  She shared she is sleeping from 6 am until 5 pm most days.  She shared that has made her start to take her medication  At different times throughout the day. Discussed that importance of taking her medications constantly everyday.  Encouraged her to have meds by her bed and to set alarms on her phone so that she can take her medications right.  Patient was also encouraged to be able to recognize the things that she is doing well.  She shared that school is going well and she is been able to put her own PC together which showed she is making really good progress.  Patient shared that the fatigue was very hard for her.  Asked if she was getting outside at all.  She shared she was only getting very little.  Encouraged her to try and get outside more.  Discussed the fact that she may not be getting enough light with vitamin D and that could impact a lot of the things that are going on with her.  Encouraged her to start trying to take better care of herself get outside and make sure she is getting the right vitamins and minerals to see that she can get to function the way she  needs to.  Interventions: Cognitive Behavioral Therapy and Solution-Oriented/Positive Psychology  Diagnosis:   ICD-10-CM   1. MDD (major depressive disorder), recurrent episode, mild ([place])  F33.0     Plan: Patient is to use CBT and coping skills to decrease her depression symptoms.  She is to work on getting her days and night straight.  Patient is to set alarms in her phone to make sure she is taking her medication correctly.  Patient is to focus on trying to make sure she has enough vitamin D and other vitamins to be healthy. Long-term  goal: Develop healthy cognitive patterns and beliefs about self in the world that lead to alleviation and help prevent the relapse of depression symptoms Short-term goal: Verbally identify possible the source of depressed mood.   [name], [facility]

## 2020-04-07 ENCOUNTER — Telehealth: Payer: Self-pay | Admitting: Psychiatry

## 2020-04-09 ENCOUNTER — Ambulatory Visit (INDEPENDENT_AMBULATORY_CARE_PROVIDER_SITE_OTHER): Payer: Self-pay | Admitting: Psychiatry

## 2020-04-09 DIAGNOSIS — F331 Major depressive disorder, recurrent, moderate: Secondary | ICD-10-CM

## 2020-04-09 NOTE — Progress Notes (Signed)
[facility] Counselor/Therapist Progress Note  Patient ID: [name], MRN: [id number],    Date: 04/09/2020  Time Spent: 50 minutes start time 11:05 AM end time 11:55 AM Virtual Visit via Telephone Note Connected with patient by a video enabled telemedicine/telehealth application, with their informed consent, and verified patient privacy and that I am speaking with the correct person using two identifiers. I discussed the limitations, risks, security and privacy concerns of performing psychotherapy and management service by telephone and the availability of in person appointments. I also discussed with the patient that there may be a patient responsible charge related to this service. The patient expressed understanding and agreed to proceed. I discussed the treatment planning with the patient. The patient was provided an opportunity to ask questions and all were answered. The patient agreed with the plan and demonstrated an understanding of the instructions. The patient was advised to call  our office if  symptoms worsen or feel they are in a crisis state and need immediate contact.   Therapist Location: office Patient Location: home    Treatment Type: Individual Therapy  Reported Symptoms: anxiety, depressed, focusing, sleep issues, fatigue  Mental Status Exam:  Appearance:   Casual     Behavior:  Sharing  Motor:  Normal  Speech/Language:   Normal Rate  Affect:  Appropriate  Mood:  anxious and sad  Thought process:  normal  Thought content:    WNL  Sensory/Perceptual disturbances:    WNL  Orientation:  oriented to person, place, time/date and situation  Attention:  Good  Concentration:  Good  Memory:  WNL  Fund of knowledge:   Good  Insight:    Good  Judgment:   Good  Impulse Control:  Good   Risk Assessment: Danger to Self:  No Self-injurious Behavior: No Danger to Others: No Duty to Warn:no Physical Aggression / Violence:No  Access to Firearms a concern: No   Gang Involvement:No   Subjective: Met with patient via virtual session. She shared that she is still struggling. She explained that she was still having a hard time dealing with what she experienced.  She explained that she was in the appointment with her brother and the doctor.  She shared that he has had suicidal ideation for a long time.  He was also talked about different traumas he has had that has disturbed him. She shared that she knew some of the things but she didn't how bad things are.  She shared she is worried about him and not sure what to do for him. Discussed different options to try and help him or get him to engage his brain in positive activity.  Patient shared some of the things that she did that day.  Encouraged her to feel positive about the fact that she had and what she needed to do to help her brother.  Reviewed the importance of her getting back on in a positive sleep schedule.  She shared she had followed plans and was doing better until this whole incident and agreed to get back to doing what has worked in the past and get back on appropriate sleep schedule.  She shared she is maintaining okay with school overall but does have 1 assignment due today that she is concerned about, she agreed to get her mother's assistance if she needs help and understanding what has to be addressed in her lesson.  Patient was also able to acknowledge that she and her mother have a very  close relationship and that she is a support for her mom and that is something she can feel positive.  Encouraged her to continue focusing on what she can do to help her feel better about herself and the situation.  Interventions: Cognitive Behavioral Therapy and Solution-Oriented/Positive Psychology  Diagnosis:   ICD-10-CM   1. Major depressive disorder, recurrent episode, moderate ([place])  F33.1     Plan: Patient is to use CBT and coping skills to decrease depression symptoms.  Patient is to focus on the things she  can do to help her brother and mother as well as herself.  Patient is to get back on an appropriate sleep schedule over the next week.  Patient is to take medication as directed Long-term goal: Develop healthy cognitive patterns and believes about self in the world that lead to alleviation and help prevent the relapse of depression symptoms Short-term goal: Verbally identify if possible to source of depressed mood  [name], [facility]

## 2020-04-13 ENCOUNTER — Other Ambulatory Visit: Payer: Self-pay | Admitting: Psychiatry

## 2020-04-14 ENCOUNTER — Other Ambulatory Visit (HOSPITAL_COMMUNITY): Payer: Self-pay

## 2020-04-14 MED ORDER — TRAZODONE HCL 100 MG PO TABS
ORAL_TABLET | Freq: Every day | ORAL | 1 refills | Status: DC
  Filled 2020-04-14: qty 90, 90d supply, fill #0
  Filled 2020-07-20 – 2020-07-21 (×2): qty 90, 90d supply, fill #1

## 2020-04-15 ENCOUNTER — Other Ambulatory Visit (HOSPITAL_COMMUNITY): Payer: Self-pay

## 2020-04-16 ENCOUNTER — Ambulatory Visit (INDEPENDENT_AMBULATORY_CARE_PROVIDER_SITE_OTHER): Payer: Self-pay | Admitting: Psychiatry

## 2020-04-16 DIAGNOSIS — F330 Major depressive disorder, recurrent, mild: Secondary | ICD-10-CM

## 2020-04-16 NOTE — Progress Notes (Signed)
[facility] Counselor/Therapist Progress Note  Patient ID: [name], MRN: [id number],    Date: 04/16/2020  Time Spent: 49 minutes start time 3:03 PM end time 3:52 PM Virtual Visit via Telephone Note Connected with patient by a video enabled telemedicine/telehealth application, with their informed consent, and verified patient privacy and that I am speaking with the correct person using two identifiers. I discussed the limitations, risks, security and privacy concerns of performing psychotherapy and management service by telephone and the availability of in person appointments. I also discussed with the patient that there may be a patient responsible charge related to this service. The patient expressed understanding and agreed to proceed. I discussed the treatment planning with the patient. The patient was provided an opportunity to ask questions and all were answered. The patient agreed with the plan and demonstrated an understanding of the instructions. The patient was advised to call  our office if  symptoms worsen or feel they are in a crisis state and need immediate contact.   Therapist Location: office Patient Location: home    Treatment Type: Individual Therapy  Reported Symptoms: sadness, fatigue, listless, anxiety, focusing issues, low motivation  Mental Status Exam:  Appearance:   Fairly Groomed     Behavior:  Appropriate  Motor:  Normal  Speech/Language:   Normal Rate  Affect:  Labile  Mood:  labile  Thought process:  circumstantial  Thought content:    WNL  Sensory/Perceptual disturbances:    WNL  Orientation:  oriented to person, place, time/date and situation  Attention:  Good  Concentration:  Good  Memory:  WNL  Fund of knowledge:   Fair  Insight:    Fair  Judgment:   Good  Impulse Control:  Good   Risk Assessment: Danger to Self:  No Self-injurious Behavior: No Danger to Others: No Duty to Warn:no Physical Aggression / Violence:No  Access to  Firearms a concern: No  Gang Involvement:No   Subjective: Met with patient via virtual session. She shared she is feeling better from last week. She shared that she if feeling listless. She shared that she was able to to get back to a better sleep schedule.  She is still struggling with feeling sad but it is better and her anxiety has decreased.  She shared that she is still trying to spend time with her brother and he seems to be doing some better.  Reported she did not get all of her homework done but is trying to make steps towards it.  Patient was encouraged to think through what she was doing that was helpful for her.  Patient was encouraged to remind herself of the importance of staying active and trying to get in a form of routine and set limits for herself.  Different strategies to help her do that were discussed.  Patient agreed to continue working on small tasks regularly to keep her self in some sort of routine.  Interventions: Cognitive Behavioral Therapy and Solution-Oriented/Positive Psychology  Diagnosis:   ICD-10-CM   1. MDD (major depressive disorder), recurrent episode, mild ([place])  F33.0     Plan: Patient is to use CBT and coping skills to decrease depression symptoms.  Patient is to work on spending time with her mother and brother.  Patient is to focus on small test regularly to help her feel some accomplishment.  Patient is to try and connect online with her friends.  Patient is to take medication as directed Long-term goal: Develop healthy  cognitive patterns and believes about self in the world that lead to alleviation and help prevent the relapse of depression symptoms Short-term goal: Verbally identify possible source of depressed mood  [name], [facility]

## 2020-04-20 MED FILL — Escitalopram Oxalate Tab 20 MG (Base Equiv): ORAL | 90 days supply | Qty: 90 | Fill #0 | Status: AC

## 2020-04-21 ENCOUNTER — Other Ambulatory Visit (HOSPITAL_COMMUNITY): Payer: Self-pay

## 2020-05-14 ENCOUNTER — Ambulatory Visit (INDEPENDENT_AMBULATORY_CARE_PROVIDER_SITE_OTHER): Payer: Self-pay | Admitting: Psychiatry

## 2020-05-14 DIAGNOSIS — F330 Major depressive disorder, recurrent, mild: Secondary | ICD-10-CM

## 2020-05-14 NOTE — Progress Notes (Signed)
[facility] Counselor/Therapist Progress Note  Patient ID: [name], MRN: [id number],    Date: 05/14/2020  Time Spent: 50 minutes start time 3:00 PM end time 3:50 PM Virtual Visit via Telephone Note Connected with patient by a video enabled telemedicine/telehealth application, with their informed consent, and verified patient privacy and that I am speaking with the correct person using two identifiers. I discussed the limitations, risks, security and privacy concerns of performing psychotherapy and management service by telephone and the availability of in person appointments. I also discussed with the patient that there may be a patient responsible charge related to this service. The patient expressed understanding and agreed to proceed. I discussed the treatment planning with the patient. The patient was provided an opportunity to ask questions and all were answered. The patient agreed with the plan and demonstrated an understanding of the instructions. The patient was advised to call  our office if  symptoms worsen or feel they are in a crisis state and need immediate contact.   Therapist Location: office Patient Location: home    Treatment Type: Individual Therapy  Reported Symptoms: anxiety, panic, fatigue, sadness, irritability  Mental Status Exam:  Appearance:   Casual     Behavior:  Appropriate  Motor:  Normal  Speech/Language:   Normal Rate  Affect:  Appropriate  Mood:  sad  Thought process:  normal  Thought content:    WNL  Sensory/Perceptual disturbances:    WNL  Orientation:  oriented to person, place, time/date and situation  Attention:  Good  Concentration:  Good  Memory:  WNL  Fund of knowledge:   Good  Insight:    Good  Judgment:   Good  Impulse Control:  Good   Risk Assessment: Danger to Self:  No Self-injurious Behavior: No Danger to Others: No Duty to Warn:no Physical Aggression / Violence:No  Access to Firearms a concern: No  Gang  Involvement:No   Subjective: met with patient via virtual session. She shared that she was able to complete her courses and that has been positive for her. She went on to share that she has felt a crash is coming since the stress is over.  She was able to make some jam and that was a good thing.  She shared she is worried about her mom because she is realizing that they may not be able to buy a house soon.  Her brother has seemed better and that has helped her mood some.  Her older brother has [place].She shared that her mother is still trying to  Get things straight with the separation from her father.  She shared that it is bringing up anxiety for her because if her mother can't get things changed to her as the beneficiary on her will and [address]..  Patient was encouraged to try and figure out how to utilize the time that she has with classes being done productively so that she can feel positive.  Patient reported that she would consider getting a little part-time job.  She is also planning on doing some cleaning and studying ahead for her class in the fall.  Patient stated she feels good about the A's that she made in her classes.  Encouraged her to realize how smart she is and the importance of encouraging herself.  Patient also shared that her younger brother is doing well and that is a positive thing.  Patient was also encouraged to connect with friends and try and find some  other things to do for fun.  Patient agreed to work on those things and to try and find the positives even when it is difficult.  Patient stated she has moved on from feeling frustrated with her father and is working on letting go of the past concerning him.  Interventions: Cognitive Behavioral Therapy and Solution-Oriented/Positive Psychology  Diagnosis:   ICD-10-CM   1. MDD (major depressive disorder), recurrent episode, mild ([place])  F33.0     Plan: Patient is to use CBT and coping skills to decrease depression symptoms.  Patient  is to work on finding ways of finding joy and having fun for the summer.  Patient is encouraged to continue taking medication as directed. Long-term goal: Develop healthy cognitive patterns and believes about self in the world that lead to alleviation and help prevent the relapse of depression symptoms Short-term goal: Verbally identify possible source of depressed mood   [name], [facility]

## 2020-06-02 ENCOUNTER — Other Ambulatory Visit (HOSPITAL_BASED_OUTPATIENT_CLINIC_OR_DEPARTMENT_OTHER): Payer: Self-pay

## 2020-06-02 ENCOUNTER — Other Ambulatory Visit (HOSPITAL_COMMUNITY): Payer: Self-pay

## 2020-06-02 MED FILL — Lamotrigine Tab 200 MG: ORAL | 90 days supply | Qty: 90 | Fill #0 | Status: AC

## 2020-06-02 MED FILL — Lamotrigine Tab 200 MG: ORAL | 90 days supply | Qty: 90 | Fill #0 | Status: CN

## 2020-07-20 ENCOUNTER — Other Ambulatory Visit: Payer: Self-pay | Admitting: Psychiatry

## 2020-07-20 DIAGNOSIS — F3342 Major depressive disorder, recurrent, in full remission: Secondary | ICD-10-CM

## 2020-07-21 ENCOUNTER — Other Ambulatory Visit: Payer: Self-pay | Admitting: Psychiatry

## 2020-07-21 ENCOUNTER — Other Ambulatory Visit (HOSPITAL_COMMUNITY): Payer: Self-pay

## 2020-07-21 ENCOUNTER — Other Ambulatory Visit (HOSPITAL_BASED_OUTPATIENT_CLINIC_OR_DEPARTMENT_OTHER): Payer: Self-pay

## 2020-07-21 DIAGNOSIS — F3342 Major depressive disorder, recurrent, in full remission: Secondary | ICD-10-CM

## 2020-07-22 ENCOUNTER — Other Ambulatory Visit (HOSPITAL_BASED_OUTPATIENT_CLINIC_OR_DEPARTMENT_OTHER): Payer: Self-pay

## 2020-07-23 ENCOUNTER — Other Ambulatory Visit (HOSPITAL_BASED_OUTPATIENT_CLINIC_OR_DEPARTMENT_OTHER): Payer: Self-pay

## 2020-07-23 ENCOUNTER — Other Ambulatory Visit: Payer: Self-pay | Admitting: Psychiatry

## 2020-07-23 DIAGNOSIS — F3342 Major depressive disorder, recurrent, in full remission: Secondary | ICD-10-CM

## 2020-07-23 MED ORDER — ESCITALOPRAM OXALATE 20 MG PO TABS
ORAL_TABLET | ORAL | 0 refills | Status: DC
  Filled 2020-07-23: qty 2, 2d supply, fill #0

## 2020-07-24 ENCOUNTER — Other Ambulatory Visit (HOSPITAL_BASED_OUTPATIENT_CLINIC_OR_DEPARTMENT_OTHER): Payer: Self-pay

## 2020-07-24 ENCOUNTER — Other Ambulatory Visit: Payer: Self-pay

## 2020-07-24 ENCOUNTER — Telehealth: Payer: Self-pay | Admitting: Psychiatry

## 2020-07-24 DIAGNOSIS — F3342 Major depressive disorder, recurrent, in full remission: Secondary | ICD-10-CM

## 2020-07-24 MED ORDER — ESCITALOPRAM OXALATE 20 MG PO TABS
ORAL_TABLET | Freq: Every day | ORAL | 0 refills | Status: DC
  Filled 2020-07-24: qty 30, 30d supply, fill #0

## 2020-07-24 NOTE — Telephone Encounter (Signed)
Pt would like a refill on Lexapro 20mg . Please send to [facility] at [facility].

## 2020-07-24 NOTE — Telephone Encounter (Signed)
Rx sent 

## 2020-08-25 ENCOUNTER — Other Ambulatory Visit: Payer: Self-pay | Admitting: Psychiatry

## 2020-08-25 ENCOUNTER — Other Ambulatory Visit (HOSPITAL_BASED_OUTPATIENT_CLINIC_OR_DEPARTMENT_OTHER): Payer: Self-pay

## 2020-08-25 ENCOUNTER — Other Ambulatory Visit (HOSPITAL_COMMUNITY): Payer: Self-pay

## 2020-08-25 DIAGNOSIS — F3342 Major depressive disorder, recurrent, in full remission: Secondary | ICD-10-CM

## 2020-08-26 ENCOUNTER — Other Ambulatory Visit: Payer: Self-pay

## 2020-08-26 ENCOUNTER — Ambulatory Visit (INDEPENDENT_AMBULATORY_CARE_PROVIDER_SITE_OTHER): Payer: Self-pay | Admitting: Psychiatry

## 2020-08-26 ENCOUNTER — Encounter: Payer: Self-pay | Admitting: Psychiatry

## 2020-08-26 ENCOUNTER — Other Ambulatory Visit (HOSPITAL_BASED_OUTPATIENT_CLINIC_OR_DEPARTMENT_OTHER): Payer: Self-pay

## 2020-08-26 DIAGNOSIS — F5101 Primary insomnia: Secondary | ICD-10-CM

## 2020-08-26 DIAGNOSIS — F3342 Major depressive disorder, recurrent, in full remission: Secondary | ICD-10-CM

## 2020-08-26 DIAGNOSIS — F411 Generalized anxiety disorder: Secondary | ICD-10-CM

## 2020-08-26 MED ORDER — ESCITALOPRAM OXALATE 20 MG PO TABS
ORAL_TABLET | Freq: Every day | ORAL | 0 refills | Status: DC
  Filled 2020-08-26: qty 30, 30d supply, fill #0

## 2020-08-26 MED ORDER — BELSOMRA 20 MG PO TABS: 20.0000 mg | ORAL_TABLET | Freq: Every day | ORAL | 0 refills | Status: DC

## 2020-08-26 MED ORDER — ESCITALOPRAM OXALATE 20 MG PO TABS
20.0000 mg | ORAL_TABLET | Freq: Every day | ORAL | 3 refills | Status: DC
  Filled 2020-08-26: qty 90, 90d supply, fill #0
  Filled 2020-11-23: qty 90, 90d supply, fill #1
  Filled 2021-02-15: qty 90, 90d supply, fill #2
  Filled 2021-05-04: qty 90, 90d supply, fill #3

## 2020-08-26 MED ORDER — TRAZODONE HCL 100 MG PO TABS
100.0000 mg | ORAL_TABLET | Freq: Every day | ORAL | 3 refills | Status: DC
Start: 2020-08-26 — End: 2021-05-26
  Filled 2020-08-26 – 2020-10-28 (×2): qty 90, 90d supply, fill #0
  Filled 2021-01-18: qty 90, 90d supply, fill #1
  Filled 2021-05-04: qty 90, 90d supply, fill #2

## 2020-08-26 MED ORDER — BELSOMRA 15 MG PO TABS: 15.0000 mg | ORAL_TABLET | Freq: Every day | ORAL | 0 refills | Status: DC

## 2020-08-26 MED ORDER — ALPRAZOLAM 0.5 MG PO TABS
0.5000 mg | ORAL_TABLET | ORAL | 0 refills | Status: DC | PRN
  Filled 2020-08-26: qty 30, 30d supply, fill #0

## 2020-08-26 MED ORDER — LAMOTRIGINE 200 MG PO TABS
200.0000 mg | ORAL_TABLET | Freq: Every day | ORAL | 3 refills | Status: DC
  Filled 2020-08-26: qty 90, 90d supply, fill #0
  Filled 2020-11-23: qty 90, 90d supply, fill #1
  Filled 2021-02-23: qty 90, 90d supply, fill #2

## 2020-08-27 ENCOUNTER — Other Ambulatory Visit (HOSPITAL_BASED_OUTPATIENT_CLINIC_OR_DEPARTMENT_OTHER): Payer: Self-pay

## 2020-08-27 NOTE — Progress Notes (Signed)
[name] [id number] [birth year] 30 y.o.  Subjective:   Patient ID:  [name] is a 30 y.o. (DOB Aug 08, 1990) female.  Chief Complaint:  Chief Complaint  Patient presents with   Follow-up    Anxiety, depression, and insomnia    HPI [name] presents to the office today for follow-up of anxiety, depression, and insomnia. She reports occ sleepless nights. She reports that she has been "avoiding sleep and not sure why."  She reports some anxiety at times about going to sleep. Rare nightmares (2-3 times a year) Reports vivid dreams. She reports that once she gets to sleep she will sleep 9-12 hours. She has some nausea with Trazodone and this has improved with decrease in dose. She reports that she has panic attacks periodically and sometimes without apparent trigger. Reports that she avoids taking Alprazolam "unless I really need it." She reports that she has some generalized anxiety and worry that is typically manageable. Appetite has been good.Occasional difficulty with concentration. Denies SI.   She reports that she usually has some depression and stress about her birthday. She reports periods of depression- "but I think for the most part the Lexapro has been working pretty good." Energy and motivation have been "ok, comes and goes." She has been going to school for computer programming. 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   Not currently working. Parents separation agreement was just finalized and she is hopeful that they can now move forward with moving out of the apartment. Reports father is "mentally ill." She reports that she has very limited contact with her father.   Past Psychiatric Medication Trials: Xanax Lamictal Lexapro Trintellix-allergic reaction Daytrana-Rash Ritalin Evekeo Concerta Vyvanse Sertraline Cymbalta- orthostasis, vision  changes Prozac Wellbutrin Trazodone Temazepam Remeron  PHQ2-9    Flowsheet Row Office Visit from 12/06/2014 in [facility] at [facility] Total Score 0        Review of Systems:  Review of Systems  Gastrointestinal: Negative.   Musculoskeletal:  Negative for gait problem.  Neurological:  Negative for tremors.       Vertigo last week. Migraines.  Psychiatric/Behavioral:         Please refer to HPI   Medications: I have reviewed the patient's current medications.  Current Outpatient Medications  Medication Sig Dispense Refill   LORATADINE PO Take by mouth as needed.     Meclizine HCl (BONINE PO) Take by mouth daily as needed.      midodrine (PROAMATINE) 5 MG tablet Take 1 tablet (5 mg total) by mouth 2 (two) times daily with a meal. 180 tablet 3   Multiple Vitamins-Minerals (MULTIVITAL) tablet Take 1 tablet by mouth daily. Gummy     Naproxen Sodium (ALEVE PO) Take 2 tablets by mouth daily as needed (for pain).     Suvorexant (BELSOMRA) 15 MG TABS Take 15 mg by mouth at bedtime. 10 tablet 0   Suvorexant (BELSOMRA) 20 MG TABS Take 20 mg by mouth at bedtime. 10 tablet 0   ALPRAZolam (XANAX) 0.5 MG tablet Take 1 tablet (0.5 mg total) by mouth as needed for anxiety or sleep. 30 tablet 0   escitalopram (LEXAPRO) 20 MG tablet Take 1 tablet by mouth once daily. 2 tablet 0   escitalopram (LEXAPRO) 20 MG tablet Take 1 tablet (20 mg total) by mouth daily. 90 tablet 3   lamoTRIgine (LAMICTAL) 200 MG tablet Take 1 tablet (200 mg total) by  mouth daily. 90 tablet 3   traZODone (DESYREL) 100 MG tablet Take 1 tablet (100 mg total) by mouth at bedtime. 90 tablet 3   No current facility-administered medications for this visit.    Medication Side Effects: Nausea with Trazodone.  Allergies:  Allergies  Allergen Reactions   Cymbalta [Duloxetine Hcl] Other (See Comments)    Hypotension   Daytrana [Methylphenidate] Rash    Like a sunburn   Duloxetine Other (See Comments)    Fainting     Past Medical History:  Diagnosis Date   ADHD (attention deficit hyperactivity disorder)    Anxiety    Depression    Environmental allergies    GERD (gastroesophageal reflux disease)    Myopia of both eyes    Obesity    Syncope and collapse     Past Medical History, Surgical history, Social history, and Family history were reviewed and updated as appropriate.   Please see review of systems for further details on the patient's review from today.   Objective:   Physical Exam:  There were no vitals taken for this visit.   Physical Exam Neurological:     Mental Status: She is alert and oriented to person, place, and time.     Cranial Nerves: No dysarthria.  Psychiatric:        Attention and Perception: Attention and perception normal.        Mood and Affect: Mood is anxious and depressed.        Speech: Speech normal.        Behavior: Behavior is cooperative.        Thought Content: Thought content normal. Thought content is not paranoid or delusional. Thought content does not include homicidal or suicidal ideation. Thought content does not include homicidal or suicidal plan.        Cognition and Memory: Cognition and memory normal.        Judgment: Judgment normal.     Comments: Insight intact  Lab Review:  No results found for: NA, K, CL, CO2, GLUCOSE, BUN, CREATININE, CALCIUM, PROT, ALBUMIN, AST, ALT, ALKPHOS, BILITOT, GFRNONAA, GFRAA  No results found for: WBC, RBC, HGB, HCT, PLT, MCV, MCH, MCHC, RDW, LYMPHSABS, MONOABS, EOSABS, BASOSABS  No results found for: POCLITH, LITHIUM   No results found for: PHENYTOIN, PHENOBARB, VALPROATE, CBMZ   .res Assessment: Plan:     Discussed potential benefits, risks, and and side effects of Belsomra for insomnia.  Patient agrees to trial of Belsomra.  Patient provided with Belsomra 15 mg samples and advised to take 1 tablet by mouth at bedtime for 9 days, and to increase to 20 mg tabs if 15 mg dose is ineffective and not  experiencing any side effects.  Patient advised to contact office if Belsomra is effective and to request a script for the dose that is effective.  May consider Doxepin if Belsomra is ineffective.  Continue Trazodone 100 mg po QHS for insomnia.  Continue Lexapro 20 mg po qd for anxiety and depression. Continue Xanax 0.5 mg po qd prn anxiety or insomnia.  Recommend continuing therapy with Stevphen Meuse, [facility].  Pt requests that follow-up be extended to one year due to financial constraints and agrees to schedule earlier apt if needed.  Patient advised to contact office with any questions, adverse effects, or acute worsening in signs and symptoms.   [name] was seen today for follow-up.  Diagnoses and all orders for this visit:  Primary insomnia -     traZODone (DESYREL) 100  MG tablet; Take 1 tablet (100 mg total) by mouth at bedtime. -     Suvorexant (BELSOMRA) 15 MG TABS; Take 15 mg by mouth at bedtime. -     Suvorexant (BELSOMRA) 20 MG TABS; Take 20 mg by mouth at bedtime.  Recurrent major depressive disorder, in full remission (HCC) -     escitalopram (LEXAPRO) 20 MG tablet; Take 1 tablet (20 mg total) by mouth daily. -     lamoTRIgine (LAMICTAL) 200 MG tablet; Take 1 tablet (200 mg total) by mouth daily.  Anxiety state -     escitalopram (LEXAPRO) 20 MG tablet; Take 1 tablet (20 mg total) by mouth daily. -     ALPRAZolam (XANAX) 0.5 MG tablet; Take 1 tablet (0.5 mg total) by mouth as needed for anxiety or sleep.    Please see After Visit Summary for patient specific instructions.  Future Appointments  Date Time Provider Department Center  09/10/2020  1:00 PM Stevphen Meuse, [facility] None  10/07/2020  1:00 PM Stevphen Meuse, [facility] CP[facility] None  11/03/2020  8:30 AM Jarold Motto, [place] [facility]  11/04/2020  1:00 PM Stevphen Meuse, [facility] CP-CP None  12/09/2020  1:00 PM Stevphen Meuse, [facility] CP-CP None  05/26/2021  2:30 PM [name], [facility] CP-CP None    No orders of the  defined types were placed in this encounter.   -------------------------------

## 2020-09-10 ENCOUNTER — Ambulatory Visit (INDEPENDENT_AMBULATORY_CARE_PROVIDER_SITE_OTHER): Payer: Self-pay | Admitting: Psychiatry

## 2020-09-10 DIAGNOSIS — F330 Major depressive disorder, recurrent, mild: Secondary | ICD-10-CM

## 2020-09-10 NOTE — Progress Notes (Signed)
[facility] Counselor/Therapist Progress Note  Patient ID: DESTENY FREEMAN, MRN: [id number],    Date: 09/10/2020  Time Spent: 50 minutes start time 1:05 PM end time 1:55 PM Virtual Visit via [place] with patient by a video enabled telemedicine/telehealth application , with their informed consent, and verified patient privacy and that I am speaking with the correct person using two identifiers. I discussed the limitations, risks, security and privacy concerns of performing psychotherapy and management service by telephone and the availability of in person appointments. I also discussed with the patient that there may be a patient responsible charge related to this service. The patient expressed understanding and agreed to proceed. I discussed the treatment planning with the patient. The patient was provided an opportunity to ask questions and all were answered. The patient agreed with the plan and demonstrated an understanding of the instructions. The patient was advised to call  our office if  symptoms worsen or feel they are in a crisis state and need immediate contact.   Therapist Location: office Patient Location: home    Treatment Type: Individual Therapy  Reported Symptoms: depression, low motivation, sleep issues, anxiety, panic attacks, focusing issues, intrusive thougts  Mental Status Exam:  Appearance:   Casual     Behavior:  Sharing  Motor:  Normal  Speech/Language:   Normal Rate  Affect:  Appropriate tearful  Mood:  labile  Thought process:  circumstantial  Thought content:    WNL  Sensory/Perceptual disturbances:    WNL  Orientation:  oriented to person, place, time/date, and situation  Attention:  Good  Concentration:  Good  Memory:  WNL  Fund of knowledge:   Good  Insight:    Good  Judgment:   Good  Impulse Control:  Good   Risk Assessment: Danger to Self:  No Self-injurious Behavior: No Danger to Others: No Duty to Warn:no Physical Aggression  / Violence:No  Access to Firearms a concern: No  Gang Involvement:No   Subjective: Met with patient via virtual session.  She shared that she is back in school and that is going well.  She shared that she feels there is some progress with moving on from her father and that is positive. She went on to share that she has had some good interaction with her brothers. She stated that her grandmother is in kidney failure and she is afraid she won't be around much longer.  Patient stated that she feels she can manage those emotions since she was not ever close to that grandmother.  Patient went on to share that she is still feeling frustrated with her father and his choices.  She has not been connecting with her friends on line and seems to be isolating most of the time.  Patient was encouraged to think through what she wanted for her future and to figure out ways that she could have some social interaction.  Patient became very tearful and discussed the fact that she feels she is the black sheep of the black sheep and is just trying to accept that and move forward.  Patient was encouraged to recognize her value and worth and that she does not have to stay stuck in that situation.  Patient struggled with hearing that and decided that it was best that she end the session.  Interventions: Cognitive Behavioral Therapy, Solution-Oriented/Positive Psychology, and Insight-Oriented  Diagnosis:   ICD-10-CM   1. MDD (major depressive disorder), recurrent episode, mild ([place])  F33.0  Plan: Patient is to use CBT and coping skills to decrease depression symptoms.  Patient is to start dream and about things that she would want for her future.  Patient is to work on IT sales professional.  Patient is to continue focusing on school work and preparing for her family to move.  Patient is to take medication as directed  [name], [facility]

## 2020-09-16 ENCOUNTER — Telehealth: Payer: Self-pay | Admitting: Psychiatry

## 2020-09-16 DIAGNOSIS — F5101 Primary insomnia: Secondary | ICD-10-CM

## 2020-09-16 NOTE — Telephone Encounter (Signed)
[name] called in with update on Belsomra 20mg .States that it helps her fall asleep but she wakes up in the middle of the night. She would like to know if she can change the dosage or try another medication. Ph: [phone] 

## 2020-09-17 ENCOUNTER — Other Ambulatory Visit (HOSPITAL_BASED_OUTPATIENT_CLINIC_OR_DEPARTMENT_OTHER): Payer: Self-pay

## 2020-09-17 MED ORDER — DAYVIGO 5 MG PO TABS
5.0000 mg | ORAL_TABLET | Freq: Every day | ORAL | 0 refills | Status: DC
  Filled 2020-09-17 (×2): qty 10, 10d supply, fill #0

## 2020-09-17 NOTE — Telephone Encounter (Signed)
Please review

## 2020-09-17 NOTE — Telephone Encounter (Signed)
Pt informed

## 2020-09-17 NOTE — Telephone Encounter (Signed)
Please find out how long she has taken the 20 mg. She should give it 10 nights since it can take that long to see full response. If no improvement after 10 days, can then try Dayvigo.

## 2020-09-17 NOTE — Telephone Encounter (Signed)
Please let her know that a script for a 10-day supply of Dayvigo was sent to her pharmacy with instructions on how pharmacy can apply free trial offer in the pharmacy notes. Recommend taking 1 tablet by mouth at bedtime, and increase to 2 tabs after 3 to 5 days if 5 mg dose is ineffective.  Please advise her to contact office if Dayvigo is effective and we can start Pt assistance application process.

## 2020-09-17 NOTE — Telephone Encounter (Signed)
Pt stated she has been taking it for 8 days and she is out of the trial you gave her

## 2020-09-18 ENCOUNTER — Other Ambulatory Visit (HOSPITAL_BASED_OUTPATIENT_CLINIC_OR_DEPARTMENT_OTHER): Payer: Self-pay

## 2020-10-07 ENCOUNTER — Ambulatory Visit (INDEPENDENT_AMBULATORY_CARE_PROVIDER_SITE_OTHER): Payer: Self-pay | Admitting: Psychiatry

## 2020-10-07 DIAGNOSIS — F331 Major depressive disorder, recurrent, moderate: Secondary | ICD-10-CM

## 2020-10-07 NOTE — Progress Notes (Signed)
[facility] Counselor/Therapist Progress Note  Patient ID: [name], MRN: [id number],    Date: 10/07/2020  Time Spent: 50 minutes start time 1:08 PM end time 1:58 PM Virtual Visit via video Note Connected with patient by a video enabled telemedicine/telehealth application, with their informed consent, and verified patient privacy and that I am speaking with the correct person using two identifiers. I discussed the limitations, risks, security and privacy concerns of performing psychotherapy and management service by telephone and the availability of in person appointments. I also discussed with the patient that there may be a patient responsible charge related to this service. The patient expressed understanding and agreed to proceed. I discussed the treatment planning with the patient. The patient was provided an opportunity to ask questions and all were answered. The patient agreed with the plan and demonstrated an understanding of the instructions. The patient was advised to call  our office if  symptoms worsen or feel they are in a crisis state and need immediate contact.   Therapist Location: office Patient Location: home    Treatment Type: Individual Therapy  Reported Symptoms: anxiety, panic attacks, depression, sleep issues, pain issues,   Mental Status Exam:  Appearance:   Casual     Behavior:  Appropriate  Motor:  Normal  Speech/Language:   Normal Rate  Affect:  Appropriate  Mood:  sad  Thought process:  normal  Thought content:    WNL  Sensory/Perceptual disturbances:    WNL  Orientation:  oriented to person, place, time/date, and situation  Attention:  Good  Concentration:  Good  Memory:  WNL  Fund of knowledge:   Good  Insight:    Good  Judgment:   Good  Impulse Control:  Good   Risk Assessment: Danger to Self:  No Self-injurious Behavior: No Danger to Others: No Duty to Warn:no Physical Aggression / Violence:No  Access to Firearms a concern: No   Gang Involvement:No   Subjective: Met with patient via virtual session.  She shared that she is doing okay overall.  She has been able to get her days and nights right again even though she is still having some sleep issues.  She stated she is doing well in 1 class and struggling in the other class that is frustrating. She was able to share that she has been maintaining a good perspective with it. 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 She stated that she is taking her medication as directed and that has made her feel better as well. She shared that she and her mother went for a walk and she realized doing that does her a lot of good. She shared that her brother got into an accident and she had to take care of him  She went on to share that he is using her mother's car now so they are having to share a car. She went on to share that her mother has followed divorce papers and the sheriff will be serving him soon.  There is lots of anxiety due to not knowing how he will respond.  Discussed ways that she can set good boundaries and have safety  plans in place. She shared her mother is doing a good job of starting to connect with a group. She went on to share that she doesn't know how to socialize and didn't grow up with it so she is  having a hard time connecting with people her age. Tried to get patient to think through some possible places for her to meet people or work on communicating with others.    Interventions: Solution-Oriented/Positive Psychology and Insight-Oriented  Diagnosis:   ICD-10-CM   1. Major depressive disorder, recurrent episode, moderate ([place])  F33.1       Plan: Patient is to use CBT and coping skills to decrease depression symptoms.  Patient is to continue working on keeping her days and nights straight. Patient is to work on taking more walks to release negative emotions.  She is to take medication as  directed. Long-term goal: Develop healthy cognitive patterns and believes about self in the world that lead to alleviation and help prevent the relapse of depression symptoms Short-term goal: Verbally identify possible source of depressed mood    [name], [facility]

## 2020-10-15 ENCOUNTER — Ambulatory Visit (INDEPENDENT_AMBULATORY_CARE_PROVIDER_SITE_OTHER): Payer: Self-pay | Admitting: Psychiatry

## 2020-10-15 DIAGNOSIS — F330 Major depressive disorder, recurrent, mild: Secondary | ICD-10-CM

## 2020-10-15 NOTE — Progress Notes (Addendum)
      [facility] Counselor/Therapist Progress Note  Patient ID: [name], MRN: [id number],    Date: 10/15/2020  Time Spent: 50  minutes start time 1:02 PM end time 1:52 PM Virtual Visit via Video Note Connected with patient by a telemedicine/telehealth application, with their informed consent, and verified patient privacy and that I am speaking with the correct person using two identifiers. I discussed the limitations, risks, security and privacy concerns of performing psychotherapy and the availability of in person appointments. I also discussed with the patient that there may be a patient responsible charge related to this service. The patient expressed understanding and agreed to proceed. I discussed the treatment planning with the patient. The patient was provided an opportunity to ask questions and all were answered. The patient agreed with the plan and demonstrated an understanding of the instructions. The patient was advised to call  our office if  symptoms worsen or feel they are in a crisis state and need immediate contact.   Therapist Location: office Patient Location: home    Treatment Type: Individual Therapy  Reported Symptoms: sleep issues, panic, paranoid, sadness, triggered responses, anxiety, fatigue, rumination, nightmares, flashbacks  Mental Status Exam:  Appearance:   Disheveled     Behavior:  Drowsy  Motor:  Normal  Speech/Language:   Normal Rate  Affect:  Depressed  Mood:  depressed  Thought process:  circumstantial  Thought content:    WNL  Sensory/Perceptual disturbances:    WNL  Orientation:  oriented to person, place, time/date, and situation  Attention:  Good  Concentration:  Good  Memory:  WNL  Fund of knowledge:   Good  Insight:    Good  Judgment:   Good  Impulse Control:  Good   Risk Assessment: Danger to Self:  No Self-injurious Behavior: No Danger to Others: No Duty to Warn:no Physical Aggression / Violence:No  Access to Firearms a  concern: No  Gang Involvement:No   Subjective: Met with patient via virtual session.  She shared that she was not doing well.  She shared that her dad got the divorce papers this weekend and it triggered lots of panic over the situation. She shared that the panic has made it hard for her to function. Did processing on the issue -Dad getting divorce papers, SUDS level 8, negative cognition "I'm not safe" felt anxiety in her eyes. Patient was able to reduce SUDS level to 6. 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 Patient was able to recognize that her dad has had the papers for several days and hasn't done anything so she is probably safe now.  Interventions: Solution-Oriented/Positive Psychology, Insight-Oriented, and BSP  Diagnosis:   ICD-10-CM   1. MDD (major depressive disorder), recurrent episode, mild ([place])  F33.0       Plan: Patient is to use CBT and coping skills to decrease depression symptoms.  Patient is to work on self care and trying to engage in positive behaviors.  Patient is to take medication as directed. Long-term goal: Develop healthy cognitive patterns and believes about self in the world that lead to alleviation and help prevent the relapse of depression symptoms Short-term goal: Verbally identify if possible to source of depressed mood  [name], [facility]

## 2020-10-28 ENCOUNTER — Other Ambulatory Visit (HOSPITAL_BASED_OUTPATIENT_CLINIC_OR_DEPARTMENT_OTHER): Payer: Self-pay

## 2020-11-03 ENCOUNTER — Ambulatory Visit (INDEPENDENT_AMBULATORY_CARE_PROVIDER_SITE_OTHER): Payer: Self-pay | Admitting: Physician Assistant

## 2020-11-03 ENCOUNTER — Other Ambulatory Visit: Payer: Self-pay

## 2020-11-03 ENCOUNTER — Encounter: Payer: Self-pay | Admitting: Physician Assistant

## 2020-11-03 VITALS — BP 100/60 | HR 102 | Temp 97.7°F | Ht 61.5 in | Wt 203.0 lb

## 2020-11-03 DIAGNOSIS — F40231 Fear of injections and transfusions: Secondary | ICD-10-CM | POA: Insufficient documentation

## 2020-11-03 DIAGNOSIS — F332 Major depressive disorder, recurrent severe without psychotic features: Secondary | ICD-10-CM

## 2020-11-03 DIAGNOSIS — G909 Disorder of the autonomic nervous system, unspecified: Secondary | ICD-10-CM

## 2020-11-03 DIAGNOSIS — R42 Dizziness and giddiness: Secondary | ICD-10-CM

## 2020-11-03 DIAGNOSIS — K219 Gastro-esophageal reflux disease without esophagitis: Secondary | ICD-10-CM | POA: Insufficient documentation

## 2020-11-03 NOTE — Progress Notes (Signed)
[name] is a 30 y.o. female here to establish care.  History of Present Illness:   Chief Complaint  Patient presents with   Establish Care   [name] presents to today's visit with her mother.   Anxiety/Depression Currently compliant with lexapro 20 mg, xanax 0.5 mg, lamictal 200 mg, and trazodone 100 mg with no adverse effects. She is currently seeing a psychiatrist and is managing well.  She sees Stevphen Meuse and [name] for her mental health needs.  [place] Reports this is stress induced and has not been a problem. She admits to taking TUMS to treat her sx which has provided relief. Currently managing well and does not see this as a concern.   Autonomic Dysfunction/Dizziness In December 2021, [name] visited Jill Alexanders, with c/o experiencing lightheadedness with rapid standing or sitting up on a daily basis. Currently on midodrine 5 mg BID, Loratadine 10 mg, and meclizine HCI as needed. She is currently managing well despite having sporatic episodes of dizziness exacerbated by fast movements of her head.   Health Maintenance: Immunizations -- COVID- Not completed  Influenza Vaccine-  Not completed  Tdap- Not completed  Colonoscopy -- N/A Mammogram -- Not completed  PAP -- Not completed; due to lack of insurance Bone Density -- N/A Weight -- Stable Wt Readings from Last 3 Encounters:  11/03/20 203 lb (92.1 kg)  12/12/19 202 lb 12.8 oz (92 kg)  09/06/18 212 lb (96.2 kg)    Mood -- Stable Alcohol -- Rarely; In a special setting  Depression screen PHQ 2/9 11/03/2020  Decreased Interest 1  Down, Depressed, Hopeless 1  PHQ - 2 Score 2  Altered sleeping 2  Tired, decreased energy 2  Change in appetite 1  Feeling bad or failure about yourself  0  Trouble concentrating 2  Moving slowly or fidgety/restless 0  Suicidal thoughts 0  PHQ-9 Score 9  Difficult doing work/chores Somewhat difficult  Some recent data might be hidden    GAD 7 : Generalized  Anxiety Score 11/03/2020  Nervous, Anxious, on Edge 3  Control/stop worrying 2  Worry too much - different things 2  Trouble relaxing 3  Restless 1  Easily annoyed or irritable 0  Afraid - awful might happen 3  Total GAD 7 Score 14  Anxiety Difficulty Very difficult     Other providers/specialists: Patient Care Team: Jarold Motto, [place] as PCP - General (Physician Assistant) Marinus Maw, MD as PCP - Electrophysiology (Cardiology)   Past Medical History:  Diagnosis Date   ADHD (attention deficit hyperactivity disorder)    Allergy    Anxiety    Depression    Environmental allergies    GERD (gastroesophageal reflux disease)    Myopia of both eyes    Obesity    Syncope and collapse      Social History   Tobacco Use   Smoking status: Never   Smokeless tobacco: Never  Vaping Use   Vaping Use: Never used  Substance Use Topics   Alcohol use: Yes    Comment: Occasionaly   Drug use: No    History reviewed. No pertinent surgical history.  Family History  Problem Relation Age of Onset   Depression Mother    Hyperlipidemia Mother    Bipolar disorder Father    Diabetes Father    Hyperlipidemia Father    Bipolar disorder Brother    Diabetes Maternal Grandmother    Heart disease Maternal Grandmother    Hyperlipidemia Maternal Grandmother  Hypertension Maternal Grandmother    Heart disease Maternal Grandfather    Hyperlipidemia Maternal Grandfather    Heart disease Paternal Grandmother    Hypertension Paternal Grandmother    Cancer Paternal Grandfather    Diabetes Paternal Grandfather    Heart attack Paternal Grandfather    Bipolar disorder Paternal Aunt    Breast cancer Maternal Aunt    Epilepsy Cousin     Allergies  Allergen Reactions   Cymbalta [Duloxetine Hcl] Other (See Comments)    Hypotension   Daytrana [Methylphenidate] Rash    Like a sunburn   Duloxetine Other (See Comments)    Fainting     Current Medications:   Current Outpatient  Medications:    escitalopram (LEXAPRO) 20 MG tablet, Take 1 tablet by mouth once daily., Disp: 2 tablet, Rfl: 0   escitalopram (LEXAPRO) 20 MG tablet, Take 1 tablet (20 mg total) by mouth daily., Disp: 90 tablet, Rfl: 3   lamoTRIgine (LAMICTAL) 200 MG tablet, Take 1 tablet (200 mg total) by mouth daily., Disp: 90 tablet, Rfl: 3   Lemborexant (DAYVIGO) 5 MG TABS, Take 5 mg by mouth at bedtime., Disp: 10 tablet, Rfl: 0   LORATADINE PO, Take 10 mg by mouth as needed., Disp: , Rfl:    Meclizine HCl (BONINE PO), Take by mouth daily as needed. , Disp: , Rfl:    midodrine (PROAMATINE) 5 MG tablet, Take 1 tablet (5 mg total) by mouth 2 (two) times daily with a meal., Disp: 180 tablet, Rfl: 3   Multiple Vitamins-Minerals (MULTIVITAL) tablet, Take 1 tablet by mouth daily. Gummy, Disp: , Rfl:    Naproxen Sodium (ALEVE PO), Take 2 tablets by mouth daily as needed (for pain)., Disp: , Rfl:    traZODone (DESYREL) 100 MG tablet, Take 1 tablet (100 mg total) by mouth at bedtime., Disp: 90 tablet, Rfl: 3   ALPRAZolam (XANAX) 0.5 MG tablet, Take 1 tablet (0.5 mg total) by mouth as needed for anxiety or sleep., Disp: 30 tablet, Rfl: 0   Review of Systems:   ROS Negative unless otherwise specified per HPI.  Vitals:   Vitals:   11/03/20 0850  BP: 100/60  Pulse: (!) 102  Temp: 97.7 F (36.5 C)  TempSrc: Temporal  SpO2: 95%  Weight: 203 lb (92.1 kg)  Height: 5' 1.5" (1.562 m)      Body mass index is 37.74 kg/m.  Physical Exam:   Physical Exam Vitals and nursing note reviewed.  Constitutional:      General: She is not in acute distress.    Appearance: She is well-developed. She is not ill-appearing or toxic-appearing.  HENT:     Head: Normocephalic and atraumatic.     Right Ear: Tympanic membrane, ear canal and external ear normal. Tympanic membrane is not erythematous, retracted or bulging.     Left Ear: Tympanic membrane, ear canal and external ear normal. Tympanic membrane is not  erythematous, retracted or bulging.     Nose: Nose normal.     Right Sinus: No maxillary sinus tenderness or frontal sinus tenderness.     Left Sinus: No maxillary sinus tenderness or frontal sinus tenderness.     Mouth/Throat:     Pharynx: Uvula midline. No posterior oropharyngeal erythema.  Eyes:     General: Lids are normal.     Conjunctiva/sclera: Conjunctivae normal.  Neck:     Trachea: Trachea normal.  Cardiovascular:     Rate and Rhythm: Normal rate and regular rhythm.     Pulses:  Normal pulses.     Heart sounds: Normal heart sounds, S1 normal and S2 normal.  Pulmonary:     Effort: Pulmonary effort is normal.     Breath sounds: Normal breath sounds. No decreased breath sounds, wheezing, rhonchi or rales.  Lymphadenopathy:     Cervical: No cervical adenopathy.  Skin:    General: Skin is warm and dry.  Neurological:     General: No focal deficit present.     Mental Status: She is alert.     GCS: GCS eye subscore is 4. GCS verbal subscore is 5. GCS motor subscore is 6.     Cranial Nerves: Cranial nerves 2-12 are intact.     Sensory: Sensation is intact.     Motor: Motor function is intact.     Coordination: Coordination is intact.     Gait: Gait is intact.  Psychiatric:        Speech: Speech normal.        Behavior: Behavior normal. Behavior is cooperative.      Assessment and Plan:   Gastroesophageal reflux disease without esophagitis Well controlled at this time Avoid triggers Follow-up as needed  Autonomic dysfunction Management per cardiology  Severe episode of recurrent major depressive disorder, without psychotic features (HCC) Management per psychiatry  Dizziness No red flags on exam Trial Epley Maneuver prn -- consider referral to neuro rehab Follow-up as needed   I,Havlyn C Ratchford,acting as a scribe for [employer], PA.,have documented all relevant documentation on the behalf of Jarold Motto, PA,as directed by  Jarold Motto, PA while  in the presence of Jarold Motto, [place].  I, Jarold Motto, [place], have reviewed all documentation for this visit. The documentation on 11/03/20 for the exam, diagnosis, procedures, and orders are all accurate and complete.  Jarold Motto, PA-C

## 2020-11-03 NOTE — Patient Instructions (Signed)
It was great to see you!  Please trial the Epley Maneuvers for your symptoms If you are still having issues, let me know.  Take care,  Jarold Motto PA-C

## 2020-11-04 ENCOUNTER — Ambulatory Visit (INDEPENDENT_AMBULATORY_CARE_PROVIDER_SITE_OTHER): Payer: Self-pay | Admitting: Psychiatry

## 2020-11-04 DIAGNOSIS — F330 Major depressive disorder, recurrent, mild: Secondary | ICD-10-CM

## 2020-11-04 NOTE — Progress Notes (Signed)
[facility] Counselor/Therapist Progress Note  Patient ID: [name], MRN: [id number],    Date: 11/04/2020  Time Spent: 50 minutes start time 1:04 PM end time 1:54 PM Virtual Visit via video Note Connected with patient by a telemedicine/telehealth application, with their informed consent, and verified patient privacy and that I am speaking with the correct person using two identifiers. I discussed the limitations, risks, security and privacy concerns of performing psychotherapy and the availability of in person appointments. I also discussed with the patient that there may be a patient responsible charge related to this service. The patient expressed understanding and agreed to proceed. I discussed the treatment planning with the patient. The patient was provided an opportunity to ask questions and all were answered. The patient agreed with the plan and demonstrated an understanding of the instructions. The patient was advised to call  our office if  symptoms worsen or feel they are in a crisis state and need immediate contact.   Therapist Location: office Patient Location: home    Treatment Type: Individual Therapy  Reported Symptoms: depression, anxiety, dizzy spells, panic  Mental Status Exam:  Appearance:   Casual     Behavior:  Drowsy  Motor:  Patient had to lie down in session  Speech/Language:   Normal Rate  Affect:  Labile  Mood:  labile  Thought process:  circumstantial  Thought content:    WNL  Sensory/Perceptual disturbances:    WNL  Orientation:  oriented to person, place, time/date, and situation  Attention:  Good  Concentration:  Good  Memory:  WNL  Fund of knowledge:   Good  Insight:    Fair  Judgment:   Good  Impulse Control:  Good   Risk Assessment: Danger to Self:  Yes.  without intent/plancontracts for safety Self-injurious Behavior: No Danger to Others: No Duty to Warn:no Physical Aggression / Violence:No  Access to Firearms a concern: No   Gang Involvement:No   Subjective: Met with patient via virtual session. She shared her anxiety has increased and that has caused her to have dizzy spells and she not been able to get out of bed. She was having to lie down in bed during session.  She shared she is not doing as well on her homework and that is stressing her.  Did processing set on the panic attacks that are disturbing her, suds level 7, negative cognition "I cannot stop it" felt fear and paranoia in her head and chest.  Patient struggled with processing.  She shared that it is very difficult for her to think about things that are disturbing and that is very disturbing for her.  Encouraged patient to allow processing as well as she could.  Patient became tearful in session worked on grounding her and getting her to a better place.  Encouraged patient to recognize positives that she has accomplished recently including taking medication as directed and being on a regular sleep schedule which are both progress for patient.  Interventions: Solution-Oriented/Positive Psychology, Insight-Oriented, and BS P  Diagnosis:   ICD-10-CM   1. MDD (major depressive disorder), recurrent episode, mild ([place])  F33.0       Plan: Patient is to use CBT and coping skills to decrease depression symptoms.  Patient is to focus on what she is accomplishing.  Patient is to take medication as directed.  Patient is to continue working on a regular sleep schedule. Long-term goal: Develop healthy cognitive patterns and believes about self in the world  that lead to alleviation and help prevent the relapse of depression symptoms Short-term goal: Verbally identify if possible to source of depressed mood  [name], [facility]

## 2020-11-24 ENCOUNTER — Other Ambulatory Visit (HOSPITAL_BASED_OUTPATIENT_CLINIC_OR_DEPARTMENT_OTHER): Payer: Self-pay

## 2020-11-27 ENCOUNTER — Ambulatory Visit (INDEPENDENT_AMBULATORY_CARE_PROVIDER_SITE_OTHER): Payer: Self-pay | Admitting: Psychiatry

## 2020-11-27 DIAGNOSIS — F332 Major depressive disorder, recurrent severe without psychotic features: Secondary | ICD-10-CM

## 2020-11-27 NOTE — Progress Notes (Signed)
      [facility] Counselor/Therapist Progress Note  Patient ID: [name], MRN: [id number],    Date: 11/27/2020  Time Spent: 51 minutes start time 10:00 AM end time 10:51 AM Virtual Visit via Video Note Connected with patient by a telemedicine/telehealth application, with their informed consent, and verified patient privacy and that I am speaking with the correct person using two identifiers. I discussed the limitations, risks, security and privacy concerns of performing psychotherapy and the availability of in person appointments. I also discussed with the patient that there may be a patient responsible charge related to this service. The patient expressed understanding and agreed to proceed. I discussed the treatment planning with the patient. The patient was provided an opportunity to ask questions and all were answered. The patient agreed with the plan and demonstrated an understanding of the instructions. The patient was advised to call  our office if  symptoms worsen or feel they are in a crisis state and need immediate contact.   Therapist Location: home Patient Location: home    Treatment Type: Individual Therapy  Reported Symptoms: anxiety, panic, sadness, intrusive thoughts, sleep issues  Mental Status Exam:  Appearance:   Casual     Behavior:  Appropriate  Motor:  Normal  Speech/Language:   Normal Rate  Affect:  Appropriate  Mood:  anxious and sad  Thought process:  circumstantial  Thought content:    WNL  Sensory/Perceptual disturbances:    WNL  Orientation:  oriented to person, place, time/date, and situation  Attention:  Good  Concentration:  Good  Memory:  WNL  Fund of knowledge:   Good  Insight:    Good  Judgment:   Good  Impulse Control:  Good   Risk Assessment: Danger to Self:  No current thoughts but at times has the thoughts and she reported she would not act on them. Self-injurious Behavior: No Danger to Others: No Duty to Warn:no Physical  Aggression / Violence:No  Access to Firearms a concern: No  Gang Involvement:No   Subjective: Met with patient via virtual session. She shared she is continuing to struggle with her mood.  Patient shared the negative thoughts are very difficult for her.  Did a processing set on the negative thoughts, suds level 7, negative cognition "I want to hurt my myself" felt sadness and anxiety in her throat.  Patient struggled with dealing with the processing but she was able to get grounded.  Discussed different ways to manage them more appropriately.  Discussed the importance of focusing on the truth and recognizing the positives even if they are very small steps.  Interventions: Cognitive Behavioral Therapy, Solution-Oriented/Positive Psychology, and BSP  Diagnosis:   ICD-10-CM   1. Severe episode of recurrent major depressive disorder, without psychotic features ([place])  F33.2       Plan: Patient is to use coping skills to decrease depression.  Patient is to write down the things that she accomplishes to remind herself of those things when she is struggling.  She is to take medication as directed.  Long-term goal: Develop healthy cognitive patterns and believes about self in the world that lead to alleviation and help prevent the relapse of depression symptoms Short-term goal: Verbally identify if possible to source of depressed mood  [name], [facility]

## 2020-12-09 ENCOUNTER — Other Ambulatory Visit: Payer: Self-pay

## 2020-12-09 ENCOUNTER — Ambulatory Visit (INDEPENDENT_AMBULATORY_CARE_PROVIDER_SITE_OTHER): Payer: Self-pay | Admitting: Psychiatry

## 2020-12-09 DIAGNOSIS — F330 Major depressive disorder, recurrent, mild: Secondary | ICD-10-CM

## 2020-12-09 NOTE — Progress Notes (Signed)
[facility] Counselor/Therapist Progress Note  Patient ID: [name], MRN: [id number],    Date: 12/09/2020  Time Spent: 47 minutes start time at 1:02 PM end time 1:49 PM Virtual Visit via Video Note Connected with patient by a telemedicine/telehealth application, with their informed consent, and verified patient privacy and that I am speaking with the correct person using two identifiers. I discussed the limitations, risks, security and privacy concerns of performing psychotherapy and the availability of in person appointments. I also discussed with the patient that there may be a patient responsible charge related to this service. The patient expressed understanding and agreed to proceed. I discussed the treatment planning with the patient. The patient was provided an opportunity to ask questions and all were answered. The patient agreed with the plan and demonstrated an understanding of the instructions. The patient was advised to call  our office if  symptoms worsen or feel they are in a crisis state and need immediate contact.   Therapist Location: office Patient Location: home    Treatment Type: Individual Therapy  Reported Symptoms: depression, anxiety, sleep issues, fatigue  Mental Status Exam:  Appearance:   Disheveled     Behavior:  Drowsy  Motor:  Normal  Speech/Language:   Normal Rate  Affect:  Appropriate  Mood:  labile  Thought process:  normal  Thought content:    WNL  Sensory/Perceptual disturbances:    WNL  Orientation:  oriented to person, place, time/date, and situation  Attention:  Good  Concentration:  Good  Memory:  WNL  Fund of knowledge:   Fair  Insight:    Fair  Judgment:   Good  Impulse Control:  Good   Risk Assessment: Danger to Self:  No Self-injurious Behavior: No Danger to Others: No Duty to Warn:no Physical Aggression / Violence:No  Access to Firearms a concern: No  Gang Involvement:No   Subjective: Met with patient via virtual  session. She shared her days and nights are backwards again so that is hard.  She shared that she is getting her homework done and that has been positive. She shared she had a good Thanksgiving with her family and she is looking forward to Christmas now.  She reported she still has some school work to finish so she can get the courses completed and she is hoping to do it quickly so she can feel that relief.  She was able to think through ways that she can talk herself through making positive decisions to get the course work done as quickly as possible.  Patient was able to acknowledge that her panic is greatly decreased.  She shared that working on her issues with her dad in session had been helpful.  Also knowing that he has not made contact and the divorce finalization papers went out months ago has helped her greatly.  Patient was encouraged to continue using CBT and coping skills to decrease depression symptoms.  Interventions: Cognitive Behavioral Therapy and Solution-Oriented/Positive Psychology  Diagnosis:   ICD-10-CM   1. MDD (major depressive disorder), recurrent episode, mild ([place])  F33.0       Plan: Patient is to use CBT and coping skills to decrease depression symptoms.  Patient is to continue finding positive things to look forward to.  Patient is to work on finding ways to release negative emotions appropriately.  Patient is to take medication as directed Long-term goal: Develop healthy cognitive patterns and believes about self in the world that lead to alleviation  and help prevent the relapse of depression symptoms Short-term goal: Verbally identify if possible to source of depressed mood  [name], [facility]

## 2021-01-12 ENCOUNTER — Other Ambulatory Visit (HOSPITAL_BASED_OUTPATIENT_CLINIC_OR_DEPARTMENT_OTHER): Payer: Self-pay

## 2021-01-14 ENCOUNTER — Other Ambulatory Visit (HOSPITAL_BASED_OUTPATIENT_CLINIC_OR_DEPARTMENT_OTHER): Payer: Self-pay

## 2021-01-18 ENCOUNTER — Other Ambulatory Visit: Payer: Self-pay | Admitting: Student

## 2021-01-18 DIAGNOSIS — G909 Disorder of the autonomic nervous system, unspecified: Secondary | ICD-10-CM

## 2021-01-19 ENCOUNTER — Other Ambulatory Visit (HOSPITAL_BASED_OUTPATIENT_CLINIC_OR_DEPARTMENT_OTHER): Payer: Self-pay

## 2021-01-26 ENCOUNTER — Ambulatory Visit (INDEPENDENT_AMBULATORY_CARE_PROVIDER_SITE_OTHER): Payer: Self-pay | Admitting: Psychiatry

## 2021-01-26 DIAGNOSIS — F330 Major depressive disorder, recurrent, mild: Secondary | ICD-10-CM

## 2021-01-26 NOTE — Progress Notes (Addendum)
[facility] Counselor/Therapist Progress Note  Patient ID: [name], MRN: [id number],    Date: 01/26/2021  Time Spent: 50 minutes 1:00 PM end time 1:50 PM Virtual Visit via Video Note Connected with patient by a telemedicine/telehealth application, with their informed consent, and verified patient privacy and that I am speaking with the correct person using two identifiers. I discussed the limitations, risks, security and privacy concerns of performing psychotherapy and the availability of in person appointments. I also discussed with the patient that there may be a patient responsible charge related to this service. The patient expressed understanding and agreed to proceed. I discussed the treatment planning with the patient. The patient was provided an opportunity to ask questions and all were answered. The patient agreed with the plan and demonstrated an understanding of the instructions. The patient was advised to call  our office if  symptoms worsen or feel they are in a crisis state and need immediate contact.   Therapist Location: home Patient Location: home    Treatment Type: Individual Therapy  Reported Symptoms: panic, anxiety,focusing issues, low motivation, fatigue, irritability, sadness  Mental Status Exam:  Appearance:   Fairly Groomed     Behavior:  Drowsy  Motor:  Normal  Speech/Language:   Normal Rate  Affect:  Appropriate  Mood:  labile  Thought process:  normal  Thought content:    WNL  Sensory/Perceptual disturbances:    WNL  Orientation:  oriented to person, place, time/date, and situation  Attention:  Good  Concentration:  Good  Memory:  WNL  Fund of knowledge:   Fair  Insight:    Fair  Judgment:   Good  Impulse Control:  Good   Risk Assessment: Danger to Self:  No Self-injurious Behavior: No Danger to Others: No Duty to Warn:no Physical Aggression / Violence:No  Access to Firearms a concern: No  Gang Involvement:No   Subjective: Met  with patient via virtual session. She shared she is having lots of panic and is not sure what is causing it.  Explore what she was thinking about discussed the CBT triangle thoughts lead to feelings lead to behaviors and even if she is not cognitively aware there are a lot of thoughts going on in her head.  As she started exploring what was going on in the house she was able to recognize that she is overwhelmed with the situation and just retreats to her bedroom but then feels anxiety about not accomplishing what she needed to do to help her mother.  Developed a plan to start focusing on the things that she can do something about which of the dishes in the kitchen and the cooking.  Patient is to break things down into manageable pieces so she can accomplish what she needs to.  Patient is also to work on completing a task each day so that things do not build up and get so overwhelming.  Interventions: Cognitive Behavioral Therapy and Solution-Oriented/Positive Psychology  Diagnosis:   ICD-10-CM   1. MDD (major depressive disorder), recurrent episode, mild ([place])  F33.0        Plan: Patient is to use CBT and coping skills to decrease depression symptoms.  Patient is to follow plans from session to break things up into manageable pieces so that she can complete the task at her house.  Patient is to focus on the things that she can control fix and change.  Patient is to take medication as directed. Long-term goal: Develop healthy  cognitive patterns and believes about self in the world that lead to alleviation and help prevent the relapse of depression symptoms Short-term goal: Verbally identify if possible to source of depressed mood  [name], [facility]

## 2021-02-05 ENCOUNTER — Other Ambulatory Visit: Payer: Self-pay

## 2021-02-05 ENCOUNTER — Ambulatory Visit (INDEPENDENT_AMBULATORY_CARE_PROVIDER_SITE_OTHER): Payer: Self-pay | Admitting: Physician Assistant

## 2021-02-05 ENCOUNTER — Encounter: Payer: Self-pay | Admitting: Physician Assistant

## 2021-02-05 ENCOUNTER — Other Ambulatory Visit (HOSPITAL_BASED_OUTPATIENT_CLINIC_OR_DEPARTMENT_OTHER): Payer: Self-pay

## 2021-02-05 VITALS — BP 109/78 | HR 74 | Ht 62.0 in | Wt 203.0 lb

## 2021-02-05 DIAGNOSIS — G909 Disorder of the autonomic nervous system, unspecified: Secondary | ICD-10-CM

## 2021-02-05 DIAGNOSIS — R42 Dizziness and giddiness: Secondary | ICD-10-CM

## 2021-02-05 MED ORDER — MIDODRINE HCL 5 MG PO TABS
ORAL_TABLET | ORAL | 3 refills | Status: DC
  Filled 2021-02-05: qty 180, 90d supply, fill #0
  Filled 2021-06-22: qty 180, 90d supply, fill #1

## 2021-02-05 NOTE — Progress Notes (Signed)
Cardiology Office Note Date:  02/05/2021  Patient ID:  [name], DOB 09/21/1990, MRN [id number] PCP:  Jarold MottoWorley, Samantha, PA  Electrophysiologist:  Dr. Ladona Ridgelaylor    Chief Complaint:  annual visit  History of Present Illness: [name] is a 31 y.o. female with history of GERD, obesity, anxiety/depression, ADHD, and autonaumic dysfunction w/syncope.  She comes in today to be seen for Dr. Ladona Ridgelaylor.  Last seen by him in Feb 2020. At that time was a f/u, she had worn an monitor without arrhythmias, had not had further syncope, though did have a couple near syncopal events and was started on midodrine. ? H/o SVT, mentions she had not had any symptomatic SVT.  I saw her Aug 2020 She reports feel;ing minimally improved on the midodrine currently though only taking daily for the last few weeks, having not gotten enough at her fill for BID.  She does think she had less frequent/signigificant symptoms when taking BID. She reports she still gets her symptoms, perhaps not as often or as intense, starts in her head, gets a funny feeling, progresses to lightheaded, to date has had time to get seated without syncope.  She mentions at times quick movements of her head alone can make her feel "sick" that she clarifies as dizzy.  Otherwise occurs with postural changes.  No CP or SOB, no palpitations.  She is in [employer], [employer] (IT)culinary school currently, spends prolonged time on her feet with this She declined EKG this visit 2/2 cost No changes made Discussed life style strategies and safety  She saw A. Lanna Pocheillery, [name] Dec 2021, doing OK, no frank syncope, in school for IT, remained on midodrine, no changes were made  TODAY She is accompanied by her Mom. Generally about the same. Reports often with debilitating symptoms, days unable to get out of bed her dizziness is so bad. She describes a movement, "the room spinning" even with minimal movement even supine sometimes. When up and about with any quick/slightly fast  movement she feels off balance, with a stumbling falling sensation, a definite sense of movement/unable to get balanced and has had falls This is not a new symptom  She also reports orthostatic symptoms, these are a separate problem Not exacerbated by heat, can take hot showers and baths without triggering symptoms She has been told that compressive wear does no good and worries it will make her circulation worse, with chronically very cold feet. Midodrine has helped over the years, but minimally.  She has had some sporadic sharp, fleeting L chest pains, can have a couple back to back, though are moments only in duration, no associated symptoms     Past Medical History:  Diagnosis Date   ADHD (attention deficit hyperactivity disorder)    Allergy    Anxiety    Depression    Environmental allergies    GERD (gastroesophageal reflux disease)    Myopia of both eyes    Obesity    Syncope and collapse     No past surgical history on file.  Current Outpatient Medications  Medication Sig Dispense Refill   ALPRAZolam (XANAX) 0.5 MG tablet Take 1 tablet (0.5 mg total) by mouth as needed for anxiety or sleep. 30 tablet 0   escitalopram (LEXAPRO) 20 MG tablet Take 1 tablet by mouth once daily. 2 tablet 0   escitalopram (LEXAPRO) 20 MG tablet Take 1 tablet (20 mg total) by mouth daily. 90 tablet 3   lamoTRIgine (LAMICTAL) 200 MG tablet Take 1 tablet (200  mg total) by mouth daily. 90 tablet 3   Lemborexant (DAYVIGO) 5 MG TABS Take 5 mg by mouth at bedtime. 10 tablet 0   LORATADINE PO Take 10 mg by mouth as needed.     Meclizine HCl (BONINE PO) Take by mouth daily as needed.      midodrine (PROAMATINE) 5 MG tablet TAKE ONE TABLET BY MOUTH TWICE A DAY WITH A MEAL 60 tablet 0   Multiple Vitamins-Minerals (MULTIVITAL) tablet Take 1 tablet by mouth daily. Gummy     Naproxen Sodium (ALEVE PO) Take 2 tablets by mouth daily as needed (for pain).     traZODone (DESYREL) 100 MG tablet Take 1 tablet  (100 mg total) by mouth at bedtime. 90 tablet 3   No current facility-administered medications for this visit.    Allergies:   Cymbalta [duloxetine hcl], Daytrana [methylphenidate], and Duloxetine   Social History:  The patient  reports that she has never smoked. She has never used smokeless tobacco. She reports current alcohol use. She reports that she does not use drugs.   Family History:  The patient's family history includes Bipolar disorder in her brother, father, and paternal aunt; Breast cancer in her maternal aunt; Cancer in her paternal grandfather; Depression in her mother; Diabetes in her father, maternal grandmother, and paternal grandfather; Epilepsy in her cousin; Heart attack in her paternal grandfather; Heart disease in her maternal grandfather, maternal grandmother, and paternal grandmother; Hyperlipidemia in her father, maternal grandfather, maternal grandmother, and mother; Hypertension in her maternal grandmother and paternal grandmother.  ROS:  Please see the history of present illness.  All other systems are reviewed and otherwise negative.   PHYSICAL EXAM:  VS:  There were no vitals taken for this visit. BMI: There is no height or weight on file to calculate BMI. Well nourished, well developed, in no acute distress  HEENT: normocephalic, atraumatic  Neck: no JVD, carotid bruits or masses Cardiac: RRR; no significant murmurs, no rubs, or gallops Lungs:  CTA b/l, no wheezing, rhonchi or rales  Abd: soft, nontender MS: no deformity or atrophy Ext: no edema  Skin: warm and dry, no rash Neuro:  No gross deficits appreciated Psych: euthymic mood, full affect   EKG:  done today and reviewed by myself SR 74bpm  03/2016: 30 day monitor 1. NSR with sinus tachycardia and sinus bradycardia 2. No prolonged pauses 3. No atrial or ventricular arrhythmias. 4. Minimal noise artifact    Recent Labs: No results found for requested labs within last 8760 hours.  No results  found for requested labs within last 8760 hours.   CrCl cannot be calculated (No successful lab value found.).   Wt Readings from Last 3 Encounters:  11/03/20 203 lb (92.1 kg)  12/12/19 202 lb 12.8 oz (92 kg)  09/06/18 212 lb (96.2 kg)     Other studies reviewed: Additional studies/records reviewed today include: summarized above  ASSESSMENT AND PLAN:  1. Autonomic dysfunction 2. Syncope     Refill midodrine 5mg  BID  We discussed safety, strategies.   Urge her to wear support/compression wear, she is not very enthusiastic about this or its effectiveness discussed adequate hydration, sodium supplementation Revisited symptom recognition (she is quite familiar and aware)   She has symptoms mostly today that sound more of vertigo as well, this apparently has been part of her symptoms all along, has never been evaluated for this. I recommend discussion with her PMD, perhaps pursue an ENT or neurology evaluation    Disposition:  she would like to continue annual visits  Current medicines are reviewed at length with the patient today.  The patient did not have any concerns regarding medicines.  Norma Fredrickson, [name] 02/05/2021 5:37 AM     [facility] [address] [place] [address] [phone] (office)  [phone] (fax)

## 2021-02-05 NOTE — Patient Instructions (Signed)

## 2021-02-06 ENCOUNTER — Other Ambulatory Visit (HOSPITAL_BASED_OUTPATIENT_CLINIC_OR_DEPARTMENT_OTHER): Payer: Self-pay

## 2021-02-13 ENCOUNTER — Other Ambulatory Visit (HOSPITAL_BASED_OUTPATIENT_CLINIC_OR_DEPARTMENT_OTHER): Payer: Self-pay

## 2021-02-16 ENCOUNTER — Other Ambulatory Visit (HOSPITAL_BASED_OUTPATIENT_CLINIC_OR_DEPARTMENT_OTHER): Payer: Self-pay

## 2021-02-23 ENCOUNTER — Ambulatory Visit (INDEPENDENT_AMBULATORY_CARE_PROVIDER_SITE_OTHER): Payer: Self-pay | Admitting: Psychiatry

## 2021-02-23 ENCOUNTER — Other Ambulatory Visit (HOSPITAL_BASED_OUTPATIENT_CLINIC_OR_DEPARTMENT_OTHER): Payer: Self-pay

## 2021-02-23 DIAGNOSIS — F330 Major depressive disorder, recurrent, mild: Secondary | ICD-10-CM

## 2021-02-23 NOTE — Progress Notes (Signed)
[facility] Counselor/Therapist Progress Note  Patient ID: HARLEI LEHRMANN, MRN: [id number],    Date: 02/23/2021  Time Spent: 50 minutes Start time 1:06 PM end time 1:56 PM Virtual Visit via Video Note Connected with patient by a telemedicine/telehealth application, with their informed consent, and verified patient privacy and that I am speaking with the correct person using two identifiers. I discussed the limitations, risks, security and privacy concerns of performing psychotherapy and the availability of in person appointments. I also discussed with the patient that there may be a patient responsible charge related to this service. The patient expressed understanding and agreed to proceed. I discussed the treatment planning with the patient. The patient was provided an opportunity to ask questions and all were answered. The patient agreed with the plan and demonstrated an understanding of the instructions. The patient was advised to call  our office if  symptoms worsen or feel they are in a crisis state and need immediate contact.   Therapist Location: office Patient Location: home    Treatment Type: Individual Therapy  Reported Symptoms: sadness, anxiety, panic, low motivation, fatigue, sleep issues,  focusing issues, intrusive thoughts  Mental Status Exam:  Appearance:   Fairly Groomed     Behavior:  Drowsy  Motor:  Normal  Speech/Language:   Normal Rate  Affect:  Depressed and Labile tearful  Mood:  depressed  Thought process:  circumstantial  Thought content:    WNL  Sensory/Perceptual disturbances:    WNL  Orientation:  oriented to person, place, time/date, and situation  Attention:  Good  Concentration:  Good  Memory:  WNL  Fund of knowledge:   Fair  Insight:    Fair  Judgment:   Good  Impulse Control:  Good   Risk Assessment: Danger to Self:  No Self-injurious Behavior: No Danger to Others: No Duty to Warn:no Physical Aggression / Violence:No  Access to  Firearms a concern: No  Gang Involvement:No   Subjective: Met with patient via virtual session. She shared she is not doing well.  She explained she did not get much sleep last night.  She shared she has loud neighbors that are up all night yelling and it has been getting to her.  She went on to share she is also having issues with her school work and she isn't sure what she wants to do for a career. She explained she is 4 assignments in the class.  Encouraged her to reach out to her professor and seek tutoring to see if she can get some help. Also had patient think through what she is feeling and try to figure out how to change her thoughts and feelings. Patient was able to realize she has to tell herself she has to do her school work even if she doesn't want to and she has to find what she can do each day even if it is only something small.  Interventions: Cognitive Behavioral Therapy and Solution-Oriented/Positive Psychology  Diagnosis:   ICD-10-CM   1. MDD (major depressive disorder), recurrent episode, mild ([place])  F33.0       Plan: Patient is to use CBT and coping skills to decrease depression symptoms.  Patient is to follow plans from session to break things up into manageable pieces so that she can complete the task at her house.  Patient is to focus on the things that she can control fix and change.  Patient is to take medication as directed. Long-term goal: Develop healthy cognitive  patterns and believes about self in the world that lead to alleviation and help prevent the relapse of depression symptoms Short-term goal: Verbally identify if possible to source of depressed mood  [name], [facility]

## 2021-03-05 ENCOUNTER — Ambulatory Visit (INDEPENDENT_AMBULATORY_CARE_PROVIDER_SITE_OTHER): Payer: Self-pay | Admitting: Psychiatry

## 2021-03-05 DIAGNOSIS — F330 Major depressive disorder, recurrent, mild: Secondary | ICD-10-CM

## 2021-03-05 NOTE — Progress Notes (Signed)
[facility] Counselor/Therapist Progress Note  Patient ID: [name], MRN: [id number],    Date: 03/05/2021  Time Spent: 32 minutes start time 10:01 a.m. end time 10:33 AM Virtual Visit via Video Note Connected with patient by a telemedicine/telehealth application, with their informed consent, and verified patient privacy and that I am speaking with the correct person using two identifiers. I discussed the limitations, risks, security and privacy concerns of performing psychotherapy and the availability of in person appointments. I also discussed with the patient that there may be a patient responsible charge related to this service. The patient expressed understanding and agreed to proceed. I discussed the treatment planning with the patient. The patient was provided an opportunity to ask questions and all were answered. The patient agreed with the plan and demonstrated an understanding of the instructions. The patient was advised to call  our office if  symptoms worsen or feel they are in a crisis state and need immediate contact.   Therapist Location: office Patient Location: home    Treatment Type: Individual Therapy  Reported Symptoms: sleep issues, focusing issues, anxiety, fatigue, sadness, panic  Mental Status Exam:  Appearance:   Casual     Behavior:  Appropriate  Motor:  Normal  Speech/Language:   Normal Rate  Affect:  Appropriate  Mood:  anxious and panic attack in session  Thought process:  circumstantial  Thought content:    WNL  Sensory/Perceptual disturbances:    Headache   Orientation:  oriented to person, place, time/date, and situation  Attention:  Fair  Concentration:  Fair  Memory:  WNL  Fund of knowledge:   Fair  Insight:    Fair  Judgment:   Good  Impulse Control:  Good   Risk Assessment: Danger to Self:  No Self-injurious Behavior: No Danger to Others: No Duty to Warn:no Physical Aggression / Violence:No  Access to Firearms a concern: No   Gang Involvement:No   Subjective: Met with patient via virtual session. She shared she was having a panic attack during session. She shared she is not doing well with her school work.  Patient was encouraged to think through what was creating the issues for her and different strategies to try and help change things.  Patient was encouraged to recognize that clinician would support her if she needed to discuss trying to have more time to complete assignments due to anxiety and depression  with her professors.  Encouraged patient to try and break things down 1 step at a time to accomplish what she has to.  Patient stated she was really tired because she been up since midnight and felt that she needed to go rest so session was ended early.  Interventions: Solution-Oriented/Positive Psychology  Diagnosis:   ICD-10-CM   1. MDD (major depressive disorder), recurrent episode, mild ([place])  F33.0       Plan: Patient is to use CBT and coping skills to decrease depression symptoms.  Patient is to follow plans from session to break things up into manageable pieces so that she can complete the task at her house.  Patient is to focus on the things that she can control fix and change.  Patient is to take medication as directed. Long-term goal: Develop healthy cognitive patterns and believes about self in the world that lead to alleviation and help prevent the relapse of depression symptoms Short-term goal: Verbally identify if possible to source of depressed mood  [name], [facility]

## 2021-04-07 ENCOUNTER — Ambulatory Visit (INDEPENDENT_AMBULATORY_CARE_PROVIDER_SITE_OTHER): Payer: Self-pay | Admitting: Obstetrics and Gynecology

## 2021-04-07 ENCOUNTER — Other Ambulatory Visit: Payer: Self-pay

## 2021-04-07 NOTE — Progress Notes (Signed)
Pt not seen for appointment due to no Kyleena in the office. She has a Kyleena placed in 2018 with Dr. Ernestina Penna. She would like another [place]. She has prepaid and will come back once the Rutha Bouchard is in.  ? ?Milas Hock, MD ?Attending Obstetrician & Gynecologist, Faculty Practice ?[facility] for [employer], [facility] ? ? ?

## 2021-04-13 ENCOUNTER — Encounter: Payer: Self-pay | Admitting: Medical

## 2021-04-13 ENCOUNTER — Ambulatory Visit (INDEPENDENT_AMBULATORY_CARE_PROVIDER_SITE_OTHER): Payer: Self-pay | Admitting: Medical

## 2021-04-13 VITALS — BP 120/85 | HR 97 | Wt 201.9 lb

## 2021-04-13 DIAGNOSIS — Z30433 Encounter for removal and reinsertion of intrauterine contraceptive device: Secondary | ICD-10-CM

## 2021-04-13 DIAGNOSIS — Z30430 Encounter for insertion of intrauterine contraceptive device: Secondary | ICD-10-CM

## 2021-04-13 MED ORDER — LEVONORGESTREL 19.5 MG IU IUD
INTRAUTERINE_SYSTEM | Freq: Once | INTRAUTERINE | Status: AC
  Administered 2021-04-13: 1 via INTRAUTERINE

## 2021-04-13 NOTE — Progress Notes (Signed)
? ? ?  GYNECOLOGY CLINIC PROCEDURE NOTE ? ?Ms. MANHATTAN MCCUEN is a 31 y.o. here for [facility] IUD removal. No GYN concerns. Procedure reviewed including risks of insertion. Patient is not sexually active and UPT was not performed today.  ? ?IUD Removal  ?Patient was in the dorsal lithotomy position, normal external genitalia was noted.  A speculum was placed in the patient's vagina, normal discharge was noted, no lesions. The cervix was visualized, no lesions, no abnormal discharge.  The strings of the IUD were grasped and pulled using ring forceps. The IUD was removed in its entirety. Patient tolerated the procedure well. Cervix then cleaned with Betadine x 2.  Grasped anteriorly with a single tooth tenaculum.  Uterus sounded to 6 cm.  Kyleena IUD placed per manufacturer's recommendations.  Strings trimmed to 3 cm. Tenaculum was removed, good hemostasis noted.  Patient tolerated procedure well.  ? ?Patient was given post-procedure instructions.  Patient was also asked to check IUD strings periodically and follow up in 4 weeks for IUD check. ? ?Marny Lowenstein, PA-C ?04/13/2021 1:26 PM    ? ?

## 2021-04-22 ENCOUNTER — Ambulatory Visit (INDEPENDENT_AMBULATORY_CARE_PROVIDER_SITE_OTHER): Payer: Self-pay | Admitting: Psychiatry

## 2021-04-22 DIAGNOSIS — F330 Major depressive disorder, recurrent, mild: Secondary | ICD-10-CM

## 2021-04-22 NOTE — Progress Notes (Signed)
?    [facility] Counselor/Therapist Progress Note ? ?Patient ID: [name], MRN: [id number],   ? ?Date: 04/22/2021 ? ?Time Spent: 50 minutes start time 1:05 PM and time 1:55 PM ?Virtual Visit via Video Note ?Connected with patient by a telemedicine/telehealth application, with their informed consent, and verified patient privacy and that I am speaking with the correct person using two identifiers. I discussed the limitations, risks, security and privacy concerns of performing psychotherapy and the availability of in person appointments. I also discussed with the patient that there may be a patient responsible charge related to this service. The patient expressed understanding and agreed to proceed. ?I discussed the treatment planning with the patient. The patient was provided an opportunity to ask questions and all were answered. The patient agreed with the plan and demonstrated an understanding of the instructions. The patient was advised to call  our office if  symptoms worsen or feel they are in a crisis state and need immediate contact. ?  ?Therapist Location: office ?Patient Location: home ?  ? ?Treatment Type: Individual Therapy ? ?Reported Symptoms: fatigue, depression, anxiety, panic, sleep issues, health issues ? ?Mental Status Exam: ? ?Appearance:   Disheveled     ?Behavior:  Drowsy  ?Motor:  Normal  ?Speech/Language:   Normal Rate  ?Affect:  Congruent  ?Mood:  anxious and sad  ?Thought process:  normal  ?Thought content:    WNL  ?Sensory/Perceptual disturbances:    WNL  ?Orientation:  oriented to person, place, time/date, and situation  ?Attention:  Good  ?Concentration:  Good  ?Memory:  WNL  ?Fund of knowledge:   Good  ?Insight:    Fair  ?Judgment:   Good  ?Impulse Control:  Good  ? ?Risk Assessment: ?Danger to Self:  No ?Self-injurious Behavior: No ?Danger to Others: No ?Duty to Warn:no ?Physical Aggression / Violence:No  ?Access to Firearms a concern: No  ?Gang Involvement:No  ? ?Subjective: Met  with patient via virtual session.  She had a lot of health issues due to anxiety and having to get her IUD replaced and that was difficult. She got behind in her school work but she was able to get in contact with her professors so they are working with her.  She shared that they may be moving soon and she is hoping that will be a good thing for her.  Patient was encouraged to think about her schoolwork and how she needed to break things down to make sure she is able to accomplish what she needs to.  Through the discussion plans on utilizing self talk to make sure she is able to accomplish her goals and not get overwhelmed were discussed with patient.  Patient was encouraged to focus on setting regular goals to help her make sure she accomplishes something each day that she can feel positive about. ? ?Interventions: Cognitive Behavioral Therapy and Solution-Oriented/Positive Psychology ? ?Diagnosis: ?  ICD-10-CM   ?1. MDD (major depressive disorder), recurrent episode, mild ([facility])  F33.0   ?  ? ? ?Plan:  Patient is to use CBT and coping skills to decrease depression symptoms.  Patient is to follow plans from session to break things up into manageable pieces so that she can complete the task at her house.  Patient is to focus on the things that she can control fix and change.  Patient is to take medication as directed. ?Long-term goal: Develop healthy cognitive patterns and believes about self in the world that lead to alleviation and  help prevent the relapse of depression symptoms ?Short-term goal: Verbally identify if possible to source of depressed mood ? ?[name], [facility] ? ? ? ? ? ? ? ? ? ? ? ? ? ? ? ? ? ? ?

## 2021-04-30 ENCOUNTER — Other Ambulatory Visit (HOSPITAL_BASED_OUTPATIENT_CLINIC_OR_DEPARTMENT_OTHER): Payer: Self-pay

## 2021-05-04 ENCOUNTER — Other Ambulatory Visit (HOSPITAL_BASED_OUTPATIENT_CLINIC_OR_DEPARTMENT_OTHER): Payer: Self-pay

## 2021-05-06 ENCOUNTER — Ambulatory Visit (INDEPENDENT_AMBULATORY_CARE_PROVIDER_SITE_OTHER): Payer: Self-pay | Admitting: Psychiatry

## 2021-05-06 DIAGNOSIS — F330 Major depressive disorder, recurrent, mild: Secondary | ICD-10-CM

## 2021-05-06 NOTE — Progress Notes (Signed)
?    [facility] Counselor/Therapist Progress Note ? ?Patient ID: RYLI STANDLEE, MRN: [id number],   ? ?Date: 05/06/2021 ? ?Time Spent: 45 minutes start time 1:04 PM end time 1:49 PM ?Virtual Visit via Video Note ?Connected with patient by a telemedicine/telehealth application, with their informed consent, and verified patient privacy and that I am speaking with the correct person using two identifiers. I discussed the limitations, risks, security and privacy concerns of performing psychotherapy and the availability of in person appointments. I also discussed with the patient that there may be a patient responsible charge related to this service. The patient expressed understanding and agreed to proceed. ?I discussed the treatment planning with the patient. The patient was provided an opportunity to ask questions and all were answered. The patient agreed with the plan and demonstrated an understanding of the instructions. The patient was advised to call  our office if  symptoms worsen or feel they are in a crisis state and need immediate contact. ?  ?Therapist Location: office ?Patient Location: home ?  ? ?Treatment Type: Individual Therapy ? ?Reported Symptoms: fatigue, anxiety, sadness, sleep issues,focusing issues, dizzy spells, migraines ? ?Mental Status Exam: ? ?Appearance:   Disheveled     ?Behavior:  Drowsy  ?Motor:  Normal  ?Speech/Language:   Normal Rate  ?Affect:  Appropriate  ?Mood:  labile  ?Thought process:  normal  ?Thought content:    WNL  ?Sensory/Perceptual disturbances:    WNL  ?Orientation:  oriented to person, place, time/date, and situation  ?Attention:  Good  ?Concentration:  Good  ?Memory:  WNL  ?Fund of knowledge:   Good  ?Insight:    Fair  ?Judgment:   Good  ?Impulse Control:  Good  ? ?Risk Assessment: ?Danger to Self:  No ?Self-injurious Behavior: No ?Danger to Others: No ?Duty to Warn:no ?Physical Aggression / Violence:No  ?Access to Firearms a concern: No  ?Gang Involvement:No   ? ?Subjective: Met with patient via virtual session.  She shared that they were able to get the house, but it is going to need some work. She shared she is very stressed with her end of semester and finals. She went on to share that she is concerned about getting things ready for moving. She shared she is struggling getting things completed. Encouraged her to try figure out what she needs to tell herself through the next 2 weeks.  She stated she was feeling better about 1 of her classes which is a good thing. She applied for a job which is progress,  She also shared she had a positive weekend.  Patient was encouraged to think through what she was going to do to continue making positive choices.  She was encouraged to recognize that things can be positive because she has recently had those good experiences she needs to focus on those things.  Patient was also encouraged to continue applying for jobs and finding things to keep herself making positive choices over the summer. ? ?Interventions: Cognitive Behavioral Therapy and Solution-Oriented/Positive Psychology ? ?Diagnosis: ?  ICD-10-CM   ?1. MDD (major depressive disorder), recurrent episode, mild (H[facility])  F33.0   ?  ? ? ?Plan: Patient is to use CBT and coping skills to decrease depression symptoms.  Patient is to follow plans from session to break things up into manageable pieces so that she can complete the task at her house.  Patient is to focus on the things that she can control fix and change.  Patient is to take  medication as directed. ?Long-term goal: Develop healthy cognitive patterns and believes about self in the world that lead to alleviation and help prevent the relapse of depression symptoms ?Short-term goal: Verbally identify if possible to source of depressed mood ? ?[name], [facility] ? ? ? ? ? ? ? ? ? ? ? ? ? ? ? ? ? ? ?

## 2021-05-20 ENCOUNTER — Ambulatory Visit (INDEPENDENT_AMBULATORY_CARE_PROVIDER_SITE_OTHER): Payer: Self-pay | Admitting: Psychiatry

## 2021-05-20 DIAGNOSIS — F330 Major depressive disorder, recurrent, mild: Secondary | ICD-10-CM

## 2021-05-20 NOTE — Progress Notes (Signed)
?    [facility] Counselor/Therapist Progress Note ? ?Patient ID: AARIEL EMS, MRN: [id number],   ? ?Date: 05/20/2021 ? ?Time Spent: 42 minutes start time 1:03 PM end time 1:45 PM ?Virtual Visit via Video Note ?Connected with patient by a telemedicine/telehealth application, with their informed consent, and verified patient privacy and that I am speaking with the correct person using two identifiers. I discussed the limitations, risks, security and privacy concerns of performing psychotherapy and the availability of in person appointments. I also discussed with the patient that there may be a patient responsible charge related to this service. The patient expressed understanding and agreed to proceed. ?I discussed the treatment planning with the patient. The patient was provided an opportunity to ask questions and all were answered. The patient agreed with the plan and demonstrated an understanding of the instructions. The patient was advised to call  our office if  symptoms worsen or feel they are in a crisis state and need immediate contact. ?  ?Therapist Location: office ?Patient Location: home ?  ? ?Treatment Type: Individual Therapy ? ?Reported Symptoms: sadness, dizzy spells, anxiety ? ?Mental Status Exam: ? ?Appearance:   Casual     ?Behavior:  Appropriate  ?Motor:  Normal  ?Speech/Language:   Normal Rate  ?Affect:  Appropriate  ?Mood:  anxious  ?Thought process:  normal  ?Thought content:    WNL  ?Sensory/Perceptual disturbances:    WNL  ?Orientation:  oriented to person, place, time/date, and situation  ?Attention:  Good  ?Concentration:  Good  ?Memory:  WNL  ?Fund of knowledge:   Good  ?Insight:    Good  ?Judgment:   Good  ?Impulse Control:  Good  ? ?Risk Assessment: ?Danger to Self:  No ?Self-injurious Behavior: No ?Danger to Others: No ?Duty to Warn:no ?Physical Aggression / Violence:No  ?Access to Firearms a concern: No  ?Gang Involvement:No  ? ?Subjective: Met with patient via virtual session.  She  reported feeling some better because they were moving into their new home and she had completed her  classes.  She shared she has got her days and nights back on the right schedule.  She went on to share she is working on the little things at the house and  feeling good about feeling productive.  Patient shared she is having anxiety about living with her brother again.  Discussed a plan to help her and her mother talk with him about their concerns and and different things that they can do to make sure that no one gets triggered and the house feels like a place people would want to be.  Also discussed the possibility of them even signing contracts about the expectations and the rules for the house.  Patient reported feeling positive about that plan.  She also agreed to continue affirming herself and recognizing the positive things that she is doing.  Patient shared she wanted to cut session a little short because she wanted to get back to doing the work that she was and was very excited about feeling motivated.  Encouraged patient to continue working on the things that she is progressing on and to feel good about the work she is doing. ? ?Interventions: Cognitive Behavioral Therapy and Solution-Oriented/Positive Psychology ? ?Diagnosis: ?  ICD-10-CM   ?1. MDD (major depressive disorder), recurrent episode, mild (HCC)  F33.0   ?  ? ? ?Plan: Patient is to use CBT and coping skills to decrease depression symptoms.  Patient is to follow plans from  session to break things up into manageable pieces so that she can complete the task at her house.  Patient is to focus on the things that she can control fix and change.  Patient is to talk with mother about plans from session concerning communicating expectations for the house with her brother.  Patient is to take medication as directed. ?Long-term goal: Develop healthy cognitive patterns and believes about self in the world that lead to alleviation and help prevent the  relapse of depression symptoms ?Short-term goal: Verbally identify if possible to source of depressed mood ? ?[name], [facility] ? ? ? ? ? ? ? ? ? ? ? ? ? ? ? ? ? ? ?

## 2021-05-21 ENCOUNTER — Other Ambulatory Visit: Payer: Self-pay | Admitting: Psychiatry

## 2021-05-21 DIAGNOSIS — F411 Generalized anxiety disorder: Secondary | ICD-10-CM

## 2021-05-22 ENCOUNTER — Other Ambulatory Visit (HOSPITAL_BASED_OUTPATIENT_CLINIC_OR_DEPARTMENT_OTHER): Payer: Self-pay

## 2021-05-22 MED ORDER — ALPRAZOLAM 0.5 MG PO TABS
0.5000 mg | ORAL_TABLET | ORAL | 0 refills | Status: DC | PRN
  Filled 2021-05-22: qty 30, 30d supply, fill #0

## 2021-05-26 ENCOUNTER — Encounter (HOSPITAL_BASED_OUTPATIENT_CLINIC_OR_DEPARTMENT_OTHER): Payer: Self-pay

## 2021-05-26 ENCOUNTER — Other Ambulatory Visit (HOSPITAL_BASED_OUTPATIENT_CLINIC_OR_DEPARTMENT_OTHER): Payer: Self-pay

## 2021-05-26 ENCOUNTER — Telehealth (INDEPENDENT_AMBULATORY_CARE_PROVIDER_SITE_OTHER): Payer: Self-pay | Admitting: Psychiatry

## 2021-05-26 ENCOUNTER — Encounter: Payer: Self-pay | Admitting: Psychiatry

## 2021-05-26 DIAGNOSIS — F5101 Primary insomnia: Secondary | ICD-10-CM

## 2021-05-26 DIAGNOSIS — F3342 Major depressive disorder, recurrent, in full remission: Secondary | ICD-10-CM

## 2021-05-26 DIAGNOSIS — F411 Generalized anxiety disorder: Secondary | ICD-10-CM

## 2021-05-26 MED ORDER — BELSOMRA 20 MG PO TABS
20.0000 mg | ORAL_TABLET | Freq: Every day | ORAL | 0 refills | Status: AC
  Filled 2021-05-26: qty 10, 10d supply, fill #0

## 2021-05-26 MED ORDER — TRAZODONE HCL 100 MG PO TABS
ORAL_TABLET | ORAL | 3 refills | Status: DC
  Filled 2021-05-26: qty 90, fill #0
  Filled 2021-08-06: qty 90, 90d supply, fill #0
  Filled 2021-11-02: qty 90, 90d supply, fill #1

## 2021-05-26 MED ORDER — LAMOTRIGINE 200 MG PO TABS
200.0000 mg | ORAL_TABLET | Freq: Every day | ORAL | 3 refills | Status: DC
  Filled 2021-05-26: qty 90, 90d supply, fill #0
  Filled 2021-09-14: qty 90, 90d supply, fill #1

## 2021-05-26 MED ORDER — ALPRAZOLAM 0.5 MG PO TABS
0.5000 mg | ORAL_TABLET | ORAL | 5 refills | Status: DC | PRN
  Filled 2021-05-26 – 2021-10-07 (×2): qty 30, 30d supply, fill #0

## 2021-05-26 MED ORDER — ESCITALOPRAM OXALATE 20 MG PO TABS
20.0000 mg | ORAL_TABLET | Freq: Every day | ORAL | 3 refills | Status: DC
  Filled 2021-05-26: qty 90, 90d supply, fill #0
  Filled 2021-11-02: qty 90, 90d supply, fill #1

## 2021-05-26 NOTE — Progress Notes (Signed)
RIDWAN [name] [id number] [birth year] 30 y.o.  Virtual Visit via Video Note  I connected with pt @ on 05/26/21 at  2:30 PM EDT by a video enabled telemedicine application and verified that I am speaking with the correct person using two identifiers.   I discussed the limitations of evaluation and management by telemedicine and the availability of in person appointments. The patient expressed understanding and agreed to proceed.  I discussed the assessment and treatment plan with the patient. The patient was provided an opportunity to ask questions and all were answered. The patient agreed with the plan and demonstrated an understanding of the instructions.   The patient was advised to call back or seek an in-person evaluation if the symptoms worsen or if the condition fails to improve as anticipated.  I provided 15 minutes of non-face-to-face time during this encounter.  The patient was located at home.  The provider was located at home in [facility].   [name], PMHNP   Subjective:   Patient ID:  [name] is a 31 y.o. (DOB [birth year]) female.  Chief Complaint:  Chief Complaint  Patient presents with   Insomnia   Anxiety   Follow-up    Depression    HPI [name] presents for follow-up of anxiety, depression, and insomnia. She reports that they are in the process of moving from an apartment to a house with mother and younger brother. She reports that her younger brother causes her some anxiety. She reports that PTSD s/s are triggered on occasion. She reports that she has exaggerated startle response and hypervigilance. Denies intrusive memories. She reports that she has been having some anxiety attacks with school with some dizziness, increased HR, shortness of breath. These attacks can occur as often 5-6 times a week. She denies current depression or irritability. She reports altered sleep wake cycle and that her sleep cycle shifts from days to nights. Some anxiety with going to  bed. Denies nightmares. Mother has told her that she snores. Sometimes sleeping up to 12 hours and typically sleeps 8-12 hours a night. Energy has been ok. Has been more physically active with move and home improvements. Motivation has been ok for home improvements and motivation is lower for packing. Appetite has been ok. Eating 1-3 meals daily. Concentration difficulties at times. She has increased difficulty with concentration during times of increased stress. Denies SI.   Taking Xanax prn 1-2 times a week.   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 Reports making B's in both classes.  Taking the summer off. Thinking about finding a job.   Past Psychiatric Medication Trials: Xanax Lamictal Lexapro Trintellix-allergic reaction Daytrana-Rash Ritalin Evekeo Concerta Vyvanse Sertraline Cymbalta- orthostasis, vision changes Prozac Wellbutrin Trazodone- Increased dizziness Temazepam Remeron Belsomra-Helpful for sleep initiation. Had middle of the night awakenings    Review of Systems:  Review of Systems  Musculoskeletal:  Negative for gait problem.  Skin:  Negative for rash.  Neurological:  Positive for dizziness and headaches.       Migraines  Psychiatric/Behavioral:         Please refer to HPI   Medications: I have reviewed the patient's current medications.  Current Outpatient Medications  Medication Sig Dispense Refill   levonorgestrel (KYLEENA) 19.5 MG IUD 1 each by Intrauterine route once.     LORATADINE PO Take 10 mg by mouth daily.     Meclizine HCl (BONINE PO) Take by mouth daily as needed.      midodrine (PROAMATINE) 5  MG tablet TAKE ONE TABLET BY MOUTH TWICE A DAY WITH A MEAL 180 tablet 3   Multiple Vitamins-Minerals (MULTIVITAL) tablet Take 1 tablet by mouth daily. Gummy     Naproxen Sodium (ALEVE PO) Take 2 tablets by mouth daily as needed (for pain).     Suvorexant (BELSOMRA) 20 MG TABS Take 20 mg by mouth at bedtime for 10 days. 10 tablet 0    [START ON 06/19/2021] ALPRAZolam (XANAX) 0.5 MG tablet Take 1 tablet (0.5 mg total) by mouth as needed for anxiety or sleep. 30 tablet 5   escitalopram (LEXAPRO) 20 MG tablet Take 1 tablet (20 mg total) by mouth daily. 90 tablet 3   lamoTRIgine (LAMICTAL) 200 MG tablet Take 1 tablet (200 mg total) by mouth daily. 90 tablet 3   traZODone (DESYREL) 100 MG tablet Take 1/2-1 tablet at bedtime for insomnia 90 tablet 3   No current facility-administered medications for this visit.    Medication Side Effects: Headache  Allergies:  Allergies  Allergen Reactions   Cymbalta [Duloxetine Hcl] Other (See Comments)    Hypotension   Daytrana [Methylphenidate] Rash    Like a sunburn   Duloxetine Other (See Comments)    Fainting    Past Medical History:  Diagnosis Date   ADHD (attention deficit hyperactivity disorder)    Allergy    Anxiety    Depression    Environmental allergies    GERD (gastroesophageal reflux disease)    Myopia of both eyes    Obesity    Syncope and collapse     Family History  Problem Relation Age of Onset   Depression Mother    Hyperlipidemia Mother    Bipolar disorder Father    Diabetes Father    Hyperlipidemia Father    Bipolar disorder Brother    Diabetes Maternal Grandmother    Heart disease Maternal Grandmother    Hyperlipidemia Maternal Grandmother    Hypertension Maternal Grandmother    Heart disease Maternal Grandfather    Hyperlipidemia Maternal Grandfather    Heart disease Paternal Grandmother    Hypertension Paternal Grandmother    Cancer Paternal Grandfather    Diabetes Paternal Grandfather    Heart attack Paternal Grandfather    Bipolar disorder Paternal Aunt    Breast cancer Maternal Aunt    Epilepsy Cousin     Social History   Socioeconomic History   Marital status: Single    Spouse name: Not on file   Number of children: Not on file   Years of education: Not on file   Highest education level: Not on file  Occupational History    Occupation: Consulting civil engineer  Tobacco Use   Smoking status: Never   Smokeless tobacco: Never  Vaping Use   Vaping Use: Never used  Substance and Sexual Activity   Alcohol use: Yes    Comment: Occasionaly   Drug use: No   Sexual activity: Never    Birth control/protection: I.U.D.  Other Topics Concern   Not on file  Social History Narrative   Lives with mom   For fun: plays video games   Social Determinants of Health   Financial Resource Strain: Not on file  Food Insecurity: Not on file  Transportation Needs: Not on file  Physical Activity: Not on file  Stress: Not on file  Social Connections: Not on file  Intimate Partner Violence: Not on file    Past Medical History, Surgical history, Social history, and Family history were reviewed and updated as appropriate.  Please see review of systems for further details on the patient's review from today.   Objective:   Physical Exam:  There were no vitals taken for this visit.  Physical Exam Neurological:     Mental Status: She is alert and oriented to person, place, and time.     Cranial Nerves: No dysarthria.  Psychiatric:        Attention and Perception: Attention and perception normal.        Mood and Affect: Mood is anxious. Mood is not depressed.        Speech: Speech normal.        Behavior: Behavior is cooperative.        Thought Content: Thought content normal. Thought content is not paranoid or delusional. Thought content does not include homicidal or suicidal ideation. Thought content does not include homicidal or suicidal plan.        Cognition and Memory: Cognition and memory normal.        Judgment: Judgment normal.     Comments: Insight intact    Lab Review:  No results found for: NA, K, CL, CO2, GLUCOSE, BUN, CREATININE, CALCIUM, PROT, ALBUMIN, AST, ALT, ALKPHOS, BILITOT, GFRNONAA, GFRAA  No results found for: WBC, RBC, HGB, HCT, PLT, MCV, MCH, MCHC, RDW, LYMPHSABS, MONOABS, EOSABS, BASOSABS  No results found  for: POCLITH, LITHIUM   No results found for: PHENYTOIN, PHENOBARB, VALPROATE, CBMZ   .res Assessment: Plan:   Pt seen for 15 minutes and time spent discussing treatment options for insomnia. She reports that Belsomra may have been partially effective for insomnia. Will send in script for Belsomra 20 mg QHS #10 for her to fill with free trial offer. Discussed that she could apply for patient assistance if Belsomra free trial offer is effective. Will include website for Patient Assistance application in AVS. Office can also provide Pt Assistance Application if needed. Advised pt to turn in Pt Assistance Application to office when completed.  Continue Lexapro 20 mg po qd for anxiety and depression.  Continue lamictal 200 mg po qd for mood symptoms.  Continue Trazodone 100 mg 1/2-1 tab po QHS prn insomnia.  Continue Xanax 0.5 mg as needed for anxiety or insomnia.  Discussed treatment options for migraines. Pt prefers not to start treatment at this time.  Recommend continuing therapy with Stevphen Meuse, [facility].  Pt to follow-up in 6 months or sooner if clinically indicated.  Patient advised to contact office with any questions, adverse effects, or acute worsening in signs and symptoms.   Aalaya was seen today for insomnia, anxiety and follow-up.  Diagnoses and all orders for this visit:  Recurrent major depressive disorder, in full remission (HCC) -     escitalopram (LEXAPRO) 20 MG tablet; Take 1 tablet (20 mg total) by mouth daily. -     lamoTRIgine (LAMICTAL) 200 MG tablet; Take 1 tablet (200 mg total) by mouth daily.  Anxiety state -     escitalopram (LEXAPRO) 20 MG tablet; Take 1 tablet (20 mg total) by mouth daily. -     ALPRAZolam (XANAX) 0.5 MG tablet; Take 1 tablet (0.5 mg total) by mouth as needed for anxiety or sleep.  Primary insomnia -     Suvorexant (BELSOMRA) 20 MG TABS; Take 20 mg by mouth at bedtime for 10 days. -     traZODone (DESYREL) 100 MG tablet; Take 1/2-1 tablet at  bedtime for insomnia     Please see After Visit Summary for patient specific instructions.  Future  Appointments  Date Time Provider Department Center  06/03/2021  1:00 PM Stevphen Meuse, [facility] None    No orders of the defined types were placed in this encounter.     -------------------------------

## 2021-05-26 NOTE — Patient Instructions (Signed)
Here is the link for Belsomra Patient Assistance. If Belsomra is helpful, please complete the application and submit form to office.  ? ?[email or web address] ?

## 2021-05-27 ENCOUNTER — Other Ambulatory Visit (HOSPITAL_BASED_OUTPATIENT_CLINIC_OR_DEPARTMENT_OTHER): Payer: Self-pay

## 2021-06-03 ENCOUNTER — Ambulatory Visit (INDEPENDENT_AMBULATORY_CARE_PROVIDER_SITE_OTHER): Payer: Self-pay | Admitting: Psychiatry

## 2021-06-03 DIAGNOSIS — F330 Major depressive disorder, recurrent, mild: Secondary | ICD-10-CM

## 2021-06-03 NOTE — Progress Notes (Signed)
[facility] Counselor/Therapist Progress Note  Patient ID: [name], MRN: [id number],    Date: 06/03/2021  Time Spent: 41 minutes start time 1:04 PM end time 1:45 PM PhoneVisit via Telehealth Note Connected with patient by a telemedicine/telehealth application, with their informed consent, and verified patient privacy and that I am speaking with the correct person using two identifiers. I discussed the limitations, risks, security and privacy concerns of performing psychotherapy and the availability of in person appointments. I also discussed with the patient that there may be a patient responsible charge related to this service. The patient expressed understanding and agreed to proceed. I discussed the treatment planning with the patient. The patient was provided an opportunity to ask questions and all were answered. The patient agreed with the plan and demonstrated an understanding of the instructions. The patient was advised to call  our office if  symptoms worsen or feel they are in a crisis state and need immediate contact.   Therapist Location: office Patient Location: home    Treatment Type: Individual Therapy  Reported Symptoms: sleep issues, melt down, anxiety, sadness, fatigue, focusing issue  Risk Assessment: Danger to Self:  No Self-injurious Behavior: No Danger to Others: No Duty to Warn:no Physical Aggression / Violence:No  Access to Firearms a concern: No  Gang Involvement:No   Subjective: Met with patient via phone session.  She shared that she is still trying to get Internet at her home so she had to use her phone. Patient was distracted by her mother and brother being around. She reported that she is getting overwhelmed with the moving process and that is triggering her dizzy spells. She shared that when she gets the dizzy spells she starts beating herself up and that makes things worse. She shared she gets very worried about her mother because she doesn't stop  and take care of her self. Reminded her that thoughts lead to feelings lead to behaviors and she has to focus on talking herself through those moments rather than cycling things. Discussed the importance of using her CBT filters to help manage her emotions. Had patient think through all of the things that she has done to help in the whole process. As she was able to think through those things she started feeling better herself.  Patient was encouraged to think through what she was getting continues saying to herself and how she was going to keep her motivation going to be able to get everything unpacked in her home and cleaned up in her apartment.  Had to and session slightly early due to being fatigued from not sleeping.  Interventions: Cognitive Behavioral Therapy and Solution-Oriented/Positive Psychology  Diagnosis:   ICD-10-CM   1. MDD (major depressive disorder), recurrent episode, mild ([place])  F33.0       Plan: Patient is to use CBT and coping skills to decrease depression symptoms.  Patient is to follow plans from session to break things up into manageable pieces so that she can complete the task at her house.  Patient is to focus on the things that she can control fix and change.  Patient is to talk with mother about plans from session concerning communicating expectations for the house with her brother.  Patient is to take medication as directed. Long-term goal: Develop healthy cognitive patterns and believes about self in the world that lead to alleviation and help prevent the relapse of depression symptoms Short-term goal: Verbally identify if possible to source of depressed mood  [name], [facility]

## 2021-06-05 ENCOUNTER — Other Ambulatory Visit (HOSPITAL_BASED_OUTPATIENT_CLINIC_OR_DEPARTMENT_OTHER): Payer: Self-pay

## 2021-06-11 ENCOUNTER — Other Ambulatory Visit (HOSPITAL_BASED_OUTPATIENT_CLINIC_OR_DEPARTMENT_OTHER): Payer: Self-pay

## 2021-06-22 ENCOUNTER — Encounter (HOSPITAL_BASED_OUTPATIENT_CLINIC_OR_DEPARTMENT_OTHER): Payer: Self-pay

## 2021-06-22 ENCOUNTER — Other Ambulatory Visit (HOSPITAL_BASED_OUTPATIENT_CLINIC_OR_DEPARTMENT_OTHER): Payer: Self-pay

## 2021-06-23 ENCOUNTER — Other Ambulatory Visit (HOSPITAL_BASED_OUTPATIENT_CLINIC_OR_DEPARTMENT_OTHER): Payer: Self-pay

## 2021-06-24 ENCOUNTER — Other Ambulatory Visit (HOSPITAL_BASED_OUTPATIENT_CLINIC_OR_DEPARTMENT_OTHER): Payer: Self-pay

## 2021-07-07 ENCOUNTER — Encounter: Payer: Self-pay | Admitting: Obstetrics and Gynecology

## 2021-07-29 ENCOUNTER — Ambulatory Visit (INDEPENDENT_AMBULATORY_CARE_PROVIDER_SITE_OTHER): Payer: Self-pay | Admitting: Psychiatry

## 2021-07-29 DIAGNOSIS — F330 Major depressive disorder, recurrent, mild: Secondary | ICD-10-CM

## 2021-07-29 NOTE — Progress Notes (Signed)
[facility] Counselor/Therapist Progress Note  Patient ID: [name], MRN: [id number],    Date: 07/29/2021  Time Spent: 48 minutes start time 2:05 PM end time 2:53 PM Virtual Visit via Video Note Connected with patient by a telemedicine/telehealth application, with their informed consent, and verified patient privacy and that I am speaking with the correct person using two identifiers. I discussed the limitations, risks, security and privacy concerns of performing psychotherapy and the availability of in person appointments. I also discussed with the patient that there may be a patient responsible charge related to this service. The patient expressed understanding and agreed to proceed. I discussed the treatment planning with the patient. The patient was provided an opportunity to ask questions and all were answered. The patient agreed with the plan and demonstrated an understanding of the instructions. The patient was advised to call  our office if  symptoms worsen or feel they are in a crisis state and need immediate contact.   Therapist Location: office Patient Location: home    Treatment Type: Individual Therapy  Reported Symptoms: sleep issues, depression, anxiety, fatigue, dizzy spells, focusing issues, low motivation  Mental Status Exam:  Appearance:   Casual     Behavior:  Drowsy  Motor:  Normal  Speech/Language:   Normal Rate  Affect:  Appropriate  Mood:  labile  Thought process:  normal  Thought content:    WNL  Sensory/Perceptual disturbances:    WNL  Orientation:  oriented to person, place, time/date, and situation  Attention:  Good  Concentration:  Good  Memory:  WNL  Fund of knowledge:   Good  Insight:    Good  Judgment:   Good  Impulse Control:  Good   Risk Assessment: Danger to Self:  No Self-injurious Behavior: No Danger to Others: No Duty to Warn:no Physical Aggression / Violence:No  Access to Firearms a concern: No  Gang Involvement:No    Subjective: Met with patient via virtual session. She shared she is having sleep anxiety and did not get to sleep until 8:00 AM this morning.  Patient shared that she is enjoying her new home and they are still getting settled. She stated she had a great time at the beach with her family.  Patient stated that while she was at the beach she did not have any dizzy spells but as soon as she came back she did which confirmed that stress was causing the spells.  Encouraged patient to think about what she needs to do to try and decrease the stress so she can decrease crease her spells.  Patient struggled with saying that there were any options.  Was reminded that there have been times when she was able to accomplish things and she felt positive about herself.  Was encouraged to start writing out the things that she can do and reminding herself of those things and to try and find other things that she can do around the house that help her feel more positive about herself.  The importance of setting daily goals but not making them overwhelming was discussed with patient.  Patient was able to develop some plans that she felt would be helpful.  Interventions: Cognitive Behavioral Therapy and Solution-Oriented/Positive Psychology  Diagnosis:   ICD-10-CM   1. MDD (major depressive disorder), recurrent episode, mild ([place])  F33.0       Plan: Patient is to use CBT and coping skills to decrease depression symptoms.  Patient is to follow plans from session  to break things up into manageable pieces so that she can complete the task at her house.  Patient is to focus on the things that she can control fix and change.  Patient is to talk with mother about plans from session concerning communicating expectations for the house with her brother.  Patient is to take medication as directed. Long-term goal: Develop healthy cognitive patterns and believes about self in the world that lead to alleviation and help prevent the  relapse of depression symptoms Short-term goal: Verbally identify if possible to source of depressed mood  [name], [facility]

## 2021-08-06 ENCOUNTER — Other Ambulatory Visit (HOSPITAL_BASED_OUTPATIENT_CLINIC_OR_DEPARTMENT_OTHER): Payer: Self-pay

## 2021-08-12 ENCOUNTER — Ambulatory Visit (INDEPENDENT_AMBULATORY_CARE_PROVIDER_SITE_OTHER): Payer: Self-pay | Admitting: Psychiatry

## 2021-08-12 DIAGNOSIS — F330 Major depressive disorder, recurrent, mild: Secondary | ICD-10-CM

## 2021-08-12 NOTE — Progress Notes (Signed)
[facility] Counselor/Therapist Progress Note  Patient ID: [name], MRN: [id number],    Date: 08/12/2021  Time Spent: 50 minutes start time 3:02 PM end time 3:52 PM Virtual Visit via Video Note Connected with patient by a telemedicine/telehealth application, with their informed consent, and verified patient privacy and that I am speaking with the correct person using two identifiers. I discussed the limitations, risks, security and privacy concerns of performing psychotherapy and the availability of in person appointments. I also discussed with the patient that there may be a patient responsible charge related to this service. The patient expressed understanding and agreed to proceed. I discussed the treatment planning with the patient. The patient was provided an opportunity to ask questions and all were answered. The patient agreed with the plan and demonstrated an understanding of the instructions. The patient was advised to call  our office if  symptoms worsen or feel they are in a crisis state and need immediate contact.   Therapist Location: office Patient Location: home    Treatment Type: Individual Therapy  Reported Symptoms: sleep issues, anxiety, fatigue,sadness , headaches  Mental Status Exam:  Appearance:   Casual     Behavior:  Appropriate  Motor:  Normal  Speech/Language:   Normal Rate  Affect:  Appropriate  Mood:  labile  Thought process:  normal  Thought content:    WNL  Sensory/Perceptual disturbances:    WNL  Orientation:  oriented to person, place, time/date, and situation  Attention:  Good  Concentration:  Good  Memory:  WNL  Fund of knowledge:   Good  Insight:    Good  Judgment:   Good  Impulse Control:  Good   Risk Assessment: Danger to Self:  No Self-injurious Behavior: No Danger to Others: No Duty to Warn:no Physical Aggression / Violence:No  Access to Firearms a concern: No  Gang Involvement:No   Subjective: Met with patient via  virtual session.  She shared that she was trying to get her nights and days straight.  She went on to share that she also got her meds messed up but she is working on getting that straight as well.  She was able to get the situation with her school back on track which was good for her.  She was also able to realize that she needs something productive to focus on to feel better.  Encouraged her to figure out some projects around the house that she can do.  Patient was also encouraged to start preparing for returning to school so that she can stay up to date with things and getting a routine that she feels would be helpful.  Patient was encouraged to recognize that she does better when she is having some things to look forward to and engaging with her family even though that is difficult for her.  She discussed that her nephew should be coming over more and she would try to spend more time with him and her mother rather than isolating in her room.  Interventions: Cognitive Behavioral Therapy and Solution-Oriented/Positive Psychology  Diagnosis:   ICD-10-CM   1. 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  Patient is to follow plans from session to break things up into manageable pieces so that she can complete the task at her house.  Patient is to focus on the things that she can  control fix and change.  Patient is to talk with mother about plans from session concerning communicating expectations for the house with her brother.  Patient is to take medication as directed. Long-term goal: Develop healthy cognitive patterns and believes about self in the world that lead to alleviation and help prevent the relapse of depression symptoms Short-term goal: Verbally identify if possible to source of depressed mood  [name], [facility]

## 2021-08-26 ENCOUNTER — Ambulatory Visit (INDEPENDENT_AMBULATORY_CARE_PROVIDER_SITE_OTHER): Payer: Self-pay | Admitting: Psychiatry

## 2021-08-26 DIAGNOSIS — F330 Major depressive disorder, recurrent, mild: Secondary | ICD-10-CM

## 2021-08-26 NOTE — Progress Notes (Signed)
[facility] Counselor/Therapist Progress Note  Patient ID: [name], MRN: [id number],    Date: 08/26/2021  Time Spent: 50 minutes start time 3:04 PM end time 3:54 PM Virtual Visit via Video Note Connected with patient by a telemedicine/telehealth application, with their informed consent, and verified patient privacy and that I am speaking with the correct person using two identifiers. I discussed the limitations, risks, security and privacy concerns of performing psychotherapy and the availability of in person appointments. I also discussed with the patient that there may be a patient responsible charge related to this service. The patient expressed understanding and agreed to proceed. I discussed the treatment planning with the patient. The patient was provided an opportunity to ask questions and all were answered. The patient agreed with the plan and demonstrated an understanding of the instructions. The patient was advised to call  our office if  symptoms worsen or feel they are in a crisis state and need immediate contact.   Therapist Location: office Patient Location: home    Treatment Type: Individual Therapy  Reported Symptoms: sadness, anxiety, sleep issues, panic, triggered responses, intrusive thoughts, fatigue  Mental Status Exam:  Appearance:   Casual and Neat     Behavior:  Appropriate  Motor:  Normal  Speech/Language:   Normal Rate  Affect:  Appropriate  Mood:  labile  Thought process:  normal  Thought content:    WNL  Sensory/Perceptual disturbances:    WNL  Orientation:  oriented to person, place, time/date, and situation  Attention:  Good  Concentration:  Good  Memory:  WNL  Fund of knowledge:   Good  Insight:    Good  Judgment:   Good  Impulse Control:  Good   Risk Assessment: Danger to Self:   intrusive thoughts but reported being safe Self-injurious Behavior: No Danger to Others: No Duty to Warn:no Physical Aggression / Violence:No  Access to  Firearms a concern: No  Gang Involvement:No   Subjective: Met with patient via virtual session.  She shared it has been a hard week due to starting school and her younger brother having a melt down. She stated she is very worried about him and that has made things harder for her.  She shared that it is difficult due to him working for their older brother and feeling overwhelmed.  Patient shared she tried to help but it did not go well.  Encouraged her to recognize it is out of her control.  Talked to her about trying theta music or brainspotting relaxation exercise vergence to help decrease her anxiety and calm CNS. Encourage patient to focus on finding positive things to focus on and to realize that things can get better.  She was able to think of a few things after some encouragement.  Patient was encouraged to continue focusing on the positive things that she enjoys and finding different ways to engage her for other.  Patient was reminded of the importance of her own self-care even though she is trying to take care of him she has to take care of herself first.  Patient also was encouraged to stay up with her homework regularly so that she does not get behind and overwhelmed.  Interventions: Cognitive Behavioral Therapy and Solution-Oriented/Positive Psychology  Diagnosis:   ICD-10-CM   1. MDD (major depressive disorder), recurrent episode, mild ([place])  F33.0       Plan:  Patient is to use CBT and coping skills to decrease depression symptoms.  Patient  is to follow plans from session to break things up into manageable pieces so that she can complete the task at her house.  Patient is to focus on the things that she can control fix and change.  Patient is to talk with mother about plans from session concerning communicating expectations for the house with her brother.  Patient is to take medication as directed. Long-term goal: Develop healthy cognitive patterns and believes about self in the world that  lead to alleviation and help prevent the relapse of depression symptoms Short-term goal: Verbally identify if possible to source of depressed mood     , [facility]                   

## 2021-09-15 ENCOUNTER — Other Ambulatory Visit (HOSPITAL_BASED_OUTPATIENT_CLINIC_OR_DEPARTMENT_OTHER): Payer: Self-pay

## 2021-09-16 ENCOUNTER — Ambulatory Visit (INDEPENDENT_AMBULATORY_CARE_PROVIDER_SITE_OTHER): Payer: Self-pay | Admitting: Psychiatry

## 2021-09-16 DIAGNOSIS — F330 Major depressive disorder, recurrent, mild: Secondary | ICD-10-CM

## 2021-09-16 NOTE — Progress Notes (Signed)
[facility] Counselor/Therapist Progress Note  Patient ID: [name], MRN: [id number],    Date: 09/16/2021  Time Spent: 25 minutes start time 2:07 PM end time 2:32 PM Virtual Visit via Phone Note Connected with patient by a telemedicine/telehealth application, with their informed consent, and verified patient privacy and that I am speaking with the correct person using two identifiers. I discussed the limitations, risks, security and privacy concerns of performing psychotherapy and the availability of in person appointments. I also discussed with the patient that there may be a patient responsible charge related to this service. The patient expressed understanding and agreed to proceed. I discussed the treatment planning with the patient. The patient was provided an opportunity to ask questions and all were answered. The patient agreed with the plan and demonstrated an understanding of the instructions. The patient was advised to call  our office if  symptoms worsen or feel they are in a crisis state and need immediate contact.   Therapist Location: office Patient Location: home    Treatment Type: Individual Therapy  Reported Symptoms: anxiety, sadness, fatigue, focusing issue, triggered responses, sleep issues, dizzy spells  Mental Status Exam:              Speech/Language:   Normal Rate  Affect:  NA  Mood:  anxious and sad  Thought process:  normal  Thought content:    WNL  Sensory/Perceptual disturbances:    WNL  Orientation:  oriented to person, place, time/date, and situation  Attention:  Good  Concentration:  Good  Memory:  WNL  Fund of knowledge:   Good  Insight:    Good  Judgment:   Good  Impulse Control:  Good   Risk Assessment: Danger to Self:  No Self-injurious Behavior: No Danger to Others: No Duty to Warn:no Physical Aggression / Violence:No  Access to Firearms a concern: No  Gang Involvement:No   Subjective: Met with patient via virtual session.   She shared that things are hard at her house right now due to issues with her brother. She shared she is getting stressed with school.  She explained that she has a hard to get started on her homework and so she puts it off and than gets overwhelmed and doesn't get it done. She went on to explain that the stuff with her brother is making it even harder for her to get her school work completed.  She stated that she wanted to get out of the house.  She has ended up having dizzy spells and sleep issues.  Patient was encouraged to think through what she can tell her self to help her deal with the difficult situation with her brother.  She was encouraged to remind herself that she is enough and that it is not the same situation that she can get through what ever she is going through with him.  Patient agreed to work on her self talk.  She ended session early due to having dizzy spells.  Interventions: Cognitive Behavioral Therapy and Solution-Oriented/Positive Psychology  Diagnosis:   ICD-10-CM   1. MDD (major depressive disorder), recurrent episode, mild ([place])  F33.0       Plan: Patient is to use CBT and coping skills to decrease depression symptoms.  Patient is to follow plans from session to break things up into manageable pieces so that she can complete the task at her house.  Patient is to focus on the things that she can control fix and change.  Patient is to talk with mother about plans from session concerning communicating expectations for the house with her brother.  Patient is to take medication as directed. Long-term goal: Develop healthy cognitive patterns and believes about self in the world that lead to alleviation and help prevent the relapse of depression symptoms Short-term goal: Verbally identify if possible to source of depressed mood  [name], [facility]

## 2021-10-07 ENCOUNTER — Other Ambulatory Visit (HOSPITAL_BASED_OUTPATIENT_CLINIC_OR_DEPARTMENT_OTHER): Payer: Self-pay

## 2021-10-07 ENCOUNTER — Ambulatory Visit (INDEPENDENT_AMBULATORY_CARE_PROVIDER_SITE_OTHER): Payer: Self-pay | Admitting: Psychiatry

## 2021-10-07 DIAGNOSIS — F330 Major depressive disorder, recurrent, mild: Secondary | ICD-10-CM

## 2021-10-07 NOTE — Progress Notes (Signed)
[facility] Counselor/Therapist Progress Note  Patient ID: TATEM HOLSONBACK, MRN: [id number],    Date: 10/07/2021  Time Spent: 44 minutes start time 3:02 PM end time 3:46 PM Virtual Visit via Video Note Connected with patient by a telemedicine/telehealth application, with their informed consent, and verified patient privacy and that I am speaking with the correct person using two identifiers. I discussed the limitations, risks, security and privacy concerns of performing psychotherapy and the availability of in person appointments. I also discussed with the patient that there may be a patient responsible charge related to this service. The patient expressed understanding and agreed to proceed. I discussed the treatment planning with the patient. The patient was provided an opportunity to ask questions and all were answered. The patient agreed with the plan and demonstrated an understanding of the instructions. The patient was advised to call  our office if  symptoms worsen or feel they are in a crisis state and need immediate contact.   Therapist Location: office Patient Location: home    Treatment Type: Individual Therapy  Reported Symptoms: anxiety, migraine, dizzy spells, sadness, fatigue, low motivation, focusing issues  Mental Status Exam:  Appearance:   Casual     Behavior:  Appropriate  Motor:  Normal  Speech/Language:   Normal Rate  Affect:  Appropriate  Mood:  anxious  Thought process:  normal  Thought content:    WNL  Sensory/Perceptual disturbances:    WNL  Orientation:  oriented to person, place, time/date, and situation  Attention:  Good  Concentration:  Good  Memory:  WNL  Fund of knowledge:   Good  Insight:    Good  Judgment:   Good  Impulse Control:  Good   Risk Assessment: Danger to Self:  No Self-injurious Behavior: No Danger to Others: No Duty to Warn:no Physical Aggression / Violence:No  Access to Firearms a concern: No  Gang Involvement:No    Subjective: Met with patient via virtual session.  She shared she had been having migraines.  She went on to share that she has been able to accomplish some tasks at the house and that is a good thing. She has been able to get her days and nights right currently which is  huge for her. She shared she has notices that she gets depressed a few days before her period but overall it is better.  Patient stated she is doing most of her schoolwork but is having difficulty with one of her classes.  Had patient think through what she needs to say to herself and how she needed to proceed to be able to get that course work accomplished.  Also encouraged patient to continue to find different things that she can do and feel good about at home throughout the day since that helps to decrease her depressed mood.  Interventions: Cognitive Behavioral Therapy and Solution-Oriented/Positive Psychology  Diagnosis:   ICD-10-CM   1. MDD (major depressive disorder), recurrent episode, mild ([place])  F33.0       Plan: Patient is to use CBT and coping skills to decrease depression symptoms.  Patient is to follow plans from session to break things up into manageable pieces so that she can complete the task at her house.  Patient is to focus on the things that she can control fix and change.  Patient is to talk with mother about plans from session concerning communicating expectations for the house with her brother.  Patient is to take medication as directed. Long-term  goal: Develop healthy cognitive patterns and believes about self in the world that lead to alleviation and help prevent the relapse of depression symptoms Short-term goal: Verbally identify if possible to source of depressed mood    [name], [facility]

## 2021-10-28 ENCOUNTER — Ambulatory Visit (INDEPENDENT_AMBULATORY_CARE_PROVIDER_SITE_OTHER): Payer: Self-pay | Admitting: Psychiatry

## 2021-10-28 DIAGNOSIS — F330 Major depressive disorder, recurrent, mild: Secondary | ICD-10-CM

## 2021-10-28 NOTE — Progress Notes (Signed)
[facility] Counselor/Therapist Progress Note  Patient ID: [name], MRN: [id number],    Date: 10/28/2021  Time Spent: 50 minutes start time 3:00 PM end time 3:50 PM Virtual Visit via Video Note Connected with patient by a telemedicine/telehealth application, with their informed consent, and verified patient privacy and that I am speaking with the correct person using two identifiers. I discussed the limitations, risks, security and privacy concerns of performing psychotherapy and the availability of in person appointments. I also discussed with the patient that there may be a patient responsible charge related to this service. The patient expressed understanding and agreed to proceed. I discussed the treatment planning with the patient. The patient was provided an opportunity to ask questions and all were answered. The patient agreed with the plan and demonstrated an understanding of the instructions. The patient was advised to call  our office if  symptoms worsen or feel they are in a crisis state and need immediate contact.   Therapist Location: office Patient Location: home    Treatment Type: Individual Therapy  Reported Symptoms: fatigue, sleep issue, focusing issues, low motivation  Mental Status Exam:  Appearance:   Disheveled     Behavior:  Appropriate  Motor:  Normal  Speech/Language:   Normal Rate  Affect:  Appropriate  Mood:  labile  Thought process:  normal  Thought content:    WNL  Sensory/Perceptual disturbances:    WNL  Orientation:  oriented to person, place, time/date, and situation  Attention:  Good  Concentration:  Good  Memory:  WNL  Fund of knowledge:   Good  Insight:    Good  Judgment:   Good  Impulse Control:  Good   Risk Assessment: Danger to Self:  No Self-injurious Behavior: No Danger to Others: No Duty to Warn:no Physical Aggression / Violence:No  Access to Firearms a concern: No  Gang Involvement:No   Subjective: Met with patient  via virtual session.  She shared that she had just woke up.  She shared her day and nights are backwards again.  She was able to get work caught up that she was behind on.  She went on to share she was able to get a shelf put together and a new bedroom door.  She went on to share that she is having a friend come to visit soon and her nephew is coming to spend the weekend with them. Encouraged her to feel good about the positive events coming up for her.  Patient was also encouraged to think through what she can focus on that helps her feel more positive.  She has been able to accomplish more things at her house which helps her to feel good.  She was also able to complete lots of her schoolwork when she had that deadline.  Encouraged her to give herself regular deadlines that she seems to respond better to that.  Also encouraged her to focus on the things that she can control fix and change and feeling good about herself.  Interventions: Cognitive Behavioral Therapy and Solution-Oriented/Positive Psychology  Diagnosis:   ICD-10-CM   1. MDD (major depressive disorder), recurrent episode, mild ([place])  F33.0       Plan: Patient is to use CBT and coping skills to decrease depression symptoms.  Patient is to follow plans from session to break things up into manageable pieces so that she can complete the task at her house.  Patient is to focus on the things that she can  control fix and change.  Patient is to talk with mother about plans from session concerning communicating expectations for the house with her brother.  Patient is to take medication as directed. Long-term goal: Develop healthy cognitive patterns and believes about self in the world that lead to alleviation and help prevent the relapse of depression symptoms Short-term goal: Verbally identify if possible to source of depressed mood  [name], [facility]

## 2021-11-02 ENCOUNTER — Other Ambulatory Visit (HOSPITAL_BASED_OUTPATIENT_CLINIC_OR_DEPARTMENT_OTHER): Payer: Self-pay

## 2021-11-26 ENCOUNTER — Encounter: Payer: Self-pay | Admitting: Psychiatry

## 2021-11-26 ENCOUNTER — Other Ambulatory Visit (HOSPITAL_BASED_OUTPATIENT_CLINIC_OR_DEPARTMENT_OTHER): Payer: Self-pay

## 2021-11-26 ENCOUNTER — Telehealth (INDEPENDENT_AMBULATORY_CARE_PROVIDER_SITE_OTHER): Payer: Self-pay | Admitting: Psychiatry

## 2021-11-26 DIAGNOSIS — F5101 Primary insomnia: Secondary | ICD-10-CM

## 2021-11-26 DIAGNOSIS — F411 Generalized anxiety disorder: Secondary | ICD-10-CM

## 2021-11-26 DIAGNOSIS — F3342 Major depressive disorder, recurrent, in full remission: Secondary | ICD-10-CM

## 2021-11-26 MED ORDER — ESCITALOPRAM OXALATE 5 MG PO TABS
ORAL_TABLET | ORAL | 0 refills | Status: DC
  Filled 2021-11-26: qty 15, 15d supply, fill #0

## 2021-11-26 MED ORDER — TRAZODONE HCL 100 MG PO TABS
ORAL_TABLET | ORAL | 3 refills | Status: DC
  Filled 2021-11-26 – 2022-02-07 (×2): qty 90, 90d supply, fill #0
  Filled 2022-05-17: qty 90, 90d supply, fill #1
  Filled 2022-08-16: qty 90, 90d supply, fill #2
  Filled 2022-11-20 (×2): qty 90, 90d supply, fill #3

## 2021-11-26 MED ORDER — LAMOTRIGINE 200 MG PO TABS
200.0000 mg | ORAL_TABLET | Freq: Every day | ORAL | 3 refills | Status: DC
  Filled 2021-11-26: qty 90, 90d supply, fill #0
  Filled 2022-03-18: qty 90, 90d supply, fill #1

## 2021-11-26 MED ORDER — VILAZODONE HCL 10 MG PO TABS: ORAL_TABLET | ORAL | 0 refills | Status: DC

## 2021-11-26 MED ORDER — ALPRAZOLAM 0.5 MG PO TABS
0.5000 mg | ORAL_TABLET | ORAL | 3 refills | Status: DC | PRN
  Filled 2021-11-26: qty 30, 30d supply, fill #0

## 2021-11-26 NOTE — Progress Notes (Signed)
[name] [id number] [birth year] 30 y.o.  Virtual Visit via Video Note  I connected with pt @ on 11/26/21 at  3:30 PM EST by a video enabled telemedicine application and verified that I am speaking with the correct person using two identifiers.   I discussed the limitations of evaluation and management by telemedicine and the availability of in person appointments. The patient expressed understanding and agreed to proceed.  I discussed the assessment and treatment plan with the patient. The patient was provided an opportunity to ask questions and all were answered. The patient agreed with the plan and demonstrated an understanding of the instructions.   The patient was advised to call back or seek an in-person evaluation if the symptoms worsen or if the condition fails to improve as anticipated.  I provided 17 minutes of non-face-to-face time during this encounter.  The patient was located at home.  The provider was located at [facility].   [name], PMHNP   Subjective:   Patient ID:  [name] is a 31 y.o. (DOB [birth year]) female.  Chief Complaint:  Chief Complaint  Patient presents with   Depression   Anxiety    HPI [name] presents for follow-up of depression, anxiety, and insomnia. She reports that she has been more depressed and anxiety has been increased. Not sleeping well. Difficulty falling and staying asleep. She reports that she does not feel rested upon awakening. Estimates sleeping 8-9 hours total. Mother has told her that she snores. She has not been told that she stops breathing or gasps in her sleep. Mother has sleep apnea. Energy and motivation have been low. She reports that her concentration has been poor. Difficulty getting motivated. She reports that she has some stress with school. She reports that living with her brother has been an adjustment and this is causing some stress. She has had some panic attacks. Appetite is ok most of the  time. Typically not having a significant desire to eat. She reports occasional vague suicidal thoughts- "intrusive thoughts." Denies suicidal plan or intent.   Typically becomes more depressed in the fall.   She reports that she is doing ok in school.   Xanax last filled 10/07/21.   Past Psychiatric Medication Trials: Xanax Lamictal Trintellix-allergic reaction Daytrana-Rash Ritalin Evekeo Concerta Vyvanse Sertraline Prozac- Adverse effects Lexapro- Effective and then stopped working Cymbalta- orthostasis, vision changes Wellbutrin Buspar Trazodone- Increased dizziness Temazepam Remeron Belsomra-Helpful for sleep initiation. Had middle of the night awakenings    Review of Systems:  Review of Systems  Musculoskeletal:  Negative for gait problem.  Neurological:  Positive for dizziness.  Psychiatric/Behavioral:         Please refer to HPI    Medications: I have reviewed the patient's current medications.  Current Outpatient Medications  Medication Sig Dispense Refill   levonorgestrel (KYLEENA) 19.5 MG IUD 1 each by Intrauterine route once.     LORATADINE PO Take 10 mg by mouth daily.     Meclizine HCl (BONINE PO) Take by mouth daily as needed.      midodrine (PROAMATINE) 5 MG tablet TAKE ONE TABLET BY MOUTH TWICE A DAY WITH A MEAL (Patient taking differently: Take 5 mg by mouth daily. TAKE ONE TABLET BY MOUTH TWICE A DAY WITH A MEAL) 180 tablet 3   Multiple Vitamins-Minerals (MULTIVITAL) tablet Take 1 tablet by mouth daily. Gummy     Naproxen Sodium (ALEVE PO) Take 2 tablets by mouth daily as needed (for pain).  Vilazodone HCl (VIIBRYD) 10 MG TABS Take 1/2 tablet daily with breakfast for one week, then increase to 1 tablet daily with breakfast for one week, then increase to two tabs daily 30 tablet 0   ALPRAZolam (XANAX) 0.5 MG tablet Take 1 tablet (0.5 mg total) by mouth as needed for anxiety or sleep. 30 tablet 3   escitalopram (LEXAPRO) 5 MG tablet Take one tablet  with 1/2 of a 20 mg tablet (15 mg) daily for one week, then 1/2 of a 20 mg tablet (10 mg) daily for one week, then one 5 mg tablet daily for one week, then stop. 15 tablet 0   lamoTRIgine (LAMICTAL) 200 MG tablet Take 1 tablet (200 mg total) by mouth daily. 90 tablet 3   traZODone (DESYREL) 100 MG tablet Take 1/2-1 tablet by mouth at bedtime for insomnia 90 tablet 3   No current facility-administered medications for this visit.    Medication Side Effects: None  Allergies:  Allergies  Allergen Reactions   Cymbalta [Duloxetine Hcl] Other (See Comments)    Hypotension   Daytrana [Methylphenidate] Rash    Like a sunburn   Duloxetine Other (See Comments)    Fainting    Past Medical History:  Diagnosis Date   ADHD (attention deficit hyperactivity disorder)    Allergy    Anxiety    Depression    Environmental allergies    GERD (gastroesophageal reflux disease)    Myopia of both eyes    Obesity    Syncope and collapse     Family History  Problem Relation Age of Onset   Depression Mother    Hyperlipidemia Mother    Sleep apnea Mother    Bipolar disorder Father    Diabetes Father    Hyperlipidemia Father    Bipolar disorder Brother    Breast cancer Maternal Aunt    Bipolar disorder Paternal Aunt    Heart disease Maternal Grandfather    Hyperlipidemia Maternal Grandfather    Diabetes Maternal Grandmother    Heart disease Maternal Grandmother    Hyperlipidemia Maternal Grandmother    Hypertension Maternal Grandmother    Cancer Paternal Grandfather    Diabetes Paternal Grandfather    Heart attack Paternal Grandfather    Sleep apnea Paternal Grandmother    Heart disease Paternal Grandmother    Hypertension Paternal Grandmother    Epilepsy Cousin     Social History   Socioeconomic History   Marital status: Single    Spouse name: Not on file   Number of children: Not on file   Years of education: Not on file   Highest education level: Not on file  Occupational  History   Occupation: Ship broker  Tobacco Use   Smoking status: Never   Smokeless tobacco: Never  Vaping Use   Vaping Use: Never used  Substance and Sexual Activity   Alcohol use: Yes    Comment: Occasionaly   Drug use: No   Sexual activity: Never    Birth control/protection: I.U.D.  Other Topics Concern   Not on file  Social History Narrative   Lives with mom   For fun: plays video games   Social Determinants of Health   Financial Resource Strain: Not on file  Food Insecurity: Not on file  Transportation Needs: Not on file  Physical Activity: Not on file  Stress: Not on file  Social Connections: Not on file  Intimate Partner Violence: Not on file    Past Medical History, Surgical history, Social history, and  Family history were reviewed and updated as appropriate.   Please see review of systems for further details on the patient's review from today.   Objective:   Physical Exam:  There were no vitals taken for this visit.  Physical Exam Neurological:     Mental Status: She is alert and oriented to person, place, and time.     Cranial Nerves: No dysarthria.  Psychiatric:        Attention and Perception: Attention and perception normal.        Mood and Affect: Mood is anxious and depressed.        Speech: Speech normal.        Behavior: Behavior is cooperative.        Thought Content: Thought content normal. Thought content is not paranoid or delusional. Thought content does not include homicidal or suicidal ideation. Thought content does not include homicidal or suicidal plan.        Cognition and Memory: Cognition and memory normal.        Judgment: Judgment normal.     Comments: Insight intact     Lab Review:  No results found for: "NA", "K", "CL", "CO2", "GLUCOSE", "BUN", "CREATININE", "CALCIUM", "PROT", "ALBUMIN", "AST", "ALT", "ALKPHOS", "BILITOT", "GFRNONAA", "GFRAA"  No results found for: "WBC", "RBC", "HGB", "HCT", "PLT", "MCV", "MCH", "MCHC", "RDW",  "LYMPHSABS", "MONOABS", "EOSABS", "BASOSABS"  No results found for: "POCLITH", "LITHIUM"   No results found for: "PHENYTOIN", "PHENOBARB", "VALPROATE", "CBMZ"   .res Assessment: Plan:    Discussed treatment options for depression and anxiety, to include augmentation strategies or changing Lexapro to a difference antidepressant since it no longer seems to be effective. Discussed potential benefits, risks, and side effects of Viibryd. Pt agrees to trial of Viibryd. Discussed cross-titration of medications to minimize risk of discontinuation and possible side effects. Will start Viibryd 5 mg po qd fr one week, then 10 mg po qd for one week, then 20 mg po qd for anxiety and depression.  Decrease Lexapro to 15 mg po qd for one week, then 10 mg po qd for one week, then 5 mg po qd for one week, then stop.  Continue Alprazolam 0.5 mg po qd prn anxiety.  Continue Lamictal 200 mg po qd for depression.  Continue Trazodone 100 mg 1/2-1 tab po QHS prn insomnia.  Recommend continuing therapy with [name], [facility]  Pt to follow-up in 6 months or sooner if clinically indicated.  Patient advised to contact office with any questions, adverse effects, or acute worsening in signs and symptoms.   [name] was seen today for depression and anxiety.  Diagnoses and all orders for this visit:  Anxiety state -     Vilazodone HCl (VIIBRYD) 10 MG TABS; Take 1/2 tablet daily with breakfast for one week, then increase to 1 tablet daily with breakfast for one week, then increase to two tabs daily -     escitalopram (LEXAPRO) 5 MG tablet; Take one tablet with 1/2 of a 20 mg tablet (15 mg) daily for one week, then 1/2 of a 20 mg tablet (10 mg) daily for one week, then one 5 mg tablet daily for one week, then stop. -     ALPRAZolam (XANAX) 0.5 MG tablet; Take 1 tablet (0.5 mg total) by mouth as needed for anxiety or sleep.  Recurrent major depressive disorder, in full remission (HCC) -     Vilazodone HCl (VIIBRYD) 10  MG TABS; Take 1/2 tablet daily with breakfast for one week, then increase  to 1 tablet daily with breakfast for one week, then increase to two tabs daily -     escitalopram (LEXAPRO) 5 MG tablet; Take one tablet with 1/2 of a 20 mg tablet (15 mg) daily for one week, then 1/2 of a 20 mg tablet (10 mg) daily for one week, then one 5 mg tablet daily for one week, then stop. -     lamoTRIgine (LAMICTAL) 200 MG tablet; Take 1 tablet (200 mg total) by mouth daily.  Primary insomnia -     traZODone (DESYREL) 100 MG tablet; Take 1/2-1 tablet by mouth at bedtime for insomnia     Please see After Visit Summary for patient specific instructions.  Future Appointments  Date Time Provider [place]  12/07/2021  2:00 PM [name], [facility] CP-CP None  12/14/2021  2:00 PM [name], [facility] CP-CP None  01/07/2022  3:00 PM [name], [facility] CP-CP None  01/20/2022  4:00 PM [name], [facility] CP-CP None  02/03/2022  3:00 PM [name], [facility] CP-CP None    No orders of the defined types were placed in this encounter.     -------------------------------

## 2021-12-07 ENCOUNTER — Ambulatory Visit (INDEPENDENT_AMBULATORY_CARE_PROVIDER_SITE_OTHER): Payer: Self-pay | Admitting: Psychiatry

## 2021-12-07 DIAGNOSIS — F330 Major depressive disorder, recurrent, mild: Secondary | ICD-10-CM

## 2021-12-07 NOTE — Progress Notes (Signed)
[facility] Counselor/Therapist Progress Note  Patient ID: NASTASHA REISING, MRN: [id number],    Date: 12/07/2021  Time Spent: 50 minutes start time 2:03 PM end time 2:53 PM Virtual Visit via Video Note Connected with patient by a telemedicine/telehealth application, with their informed consent, and verified patient privacy and that I am speaking with the correct person using two identifiers. I discussed the limitations, risks, security and privacy concerns of performing psychotherapy and the availability of in person appointments. I also discussed with the patient that there may be a patient responsible charge related to this service. The patient expressed understanding and agreed to proceed. I discussed the treatment planning with the patient. The patient was provided an opportunity to ask questions and all were answered. The patient agreed with the plan and demonstrated an understanding of the instructions. The patient was advised to call  our office if  symptoms worsen or feel they are in a crisis state and need immediate contact.   Therapist Location: office Patient Location: home    Treatment Type: Individual Therapy  Reported Symptoms: anxiety, sadness, motivation issues, sleep issues, fatigue, health issues  Mental Status Exam:  Appearance:   Casual     Behavior:  Appropriate  Motor:  Normal  Speech/Language:   Normal Rate  Affect:  Appropriate  Mood:  sad  Thought process:  circumstantial  Thought content:    WNL  Sensory/Perceptual disturbances:    WNL  Orientation:  oriented to person, place, time/date, and situation  Attention:  Good  Concentration:  Good  Memory:  WNL  Fund of knowledge:   Good  Insight:    Good  Judgment:   Good  Impulse Control:  Good   Risk Assessment: Danger to Self:  No Self-injurious Behavior: No Danger to Others: No Duty to Warn:no Physical Aggression / Violence:No  Access to Firearms a concern: No  Gang Involvement:No    Subjective: Met with patient via virtual session.  She shared that she had a good Thanksgiving but she found out that her brother and his family got COVID and he exposed them all. There are issues with her brother that is living with them. She shared that it has been frustrating. 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 She shared that it is really bad and she can't do anything about it. She shared she was real depressed she finally realized her hormones were causing it.  Patient was encouraged to think through the positive things that have happened over the past year and to remind herself that things can move in a good direction.  Patient was also encouraged to recognize that breaking things down into small pieces helps her be able to accomplish goals and tasks and that she does not have to stay stuck in the situation she is currently.  The importance of keeping herself motivated to get her class work done and do what she needs around the house was discussed with patient.  Interventions: Cognitive Behavioral Therapy and Solution-Oriented/Positive Psychology  Diagnosis:   ICD-10-CM   1. MDD (major depressive disorder), recurrent episode, mild ([place])  F33.0       Plan:  Patient is to use CBT and coping skills to decrease depression symptoms.  Patient is to follow plans from session to break things up into manageable pieces so that she can complete the task at her house.  Patient  is to focus on the things that she can control fix and change.  Patient is to talk with mother about plans from session concerning communicating expectations for the house with her brother.  Patient is to take medication as directed. Long-term goal: Develop healthy cognitive patterns and believes about self in the world that lead to alleviation and help prevent the relapse of depression symptoms Short-term goal: Verbally identify if  possible to source of depressed mood  [name], [facility]

## 2021-12-14 ENCOUNTER — Ambulatory Visit (INDEPENDENT_AMBULATORY_CARE_PROVIDER_SITE_OTHER): Payer: Self-pay | Admitting: Psychiatry

## 2021-12-14 DIAGNOSIS — F330 Major depressive disorder, recurrent, mild: Secondary | ICD-10-CM

## 2021-12-14 NOTE — Progress Notes (Signed)
[facility] Counselor/Therapist Progress Note  Patient ID: FIDELIS LOTH, MRN: [id number],    Date: 12/14/2021  Time Spent: 30 minutes start time 2:10 PM end time 2:40 PM Virtual Visit via video note Connected with patient by a telemedicine/telehealth application, with their informed consent, and verified patient privacy and that I am speaking with the correct person using two identifiers. I discussed the limitations, risks, security and privacy concerns of performing psychotherapy and the availability of in person appointments. I also discussed with the patient that there may be a patient responsible charge related to this service. The patient expressed understanding and agreed to proceed. I discussed the treatment planning with the patient. The patient was provided an opportunity to ask questions and all were answered. The patient agreed with the plan and demonstrated an understanding of the instructions. The patient was advised to call  our office if  symptoms worsen or feel they are in a crisis state and need immediate contact.   Therapist Location: office Patient Location: home    Treatment Type: Individual Therapy  Reported Symptoms: Depression, intrusive thoughts, low motivation, health issues, sleep issues, fatigue, anxiety, focusing issues  Mental Status Exam:              Speech/Language:   Normal Rate  Affect:  NA  Mood:  anxious and sad  Thought process:  circumstantial  Thought content:    WNL  Sensory/Perceptual disturbances:    WNL  Orientation:  oriented to person, place, time/date, and situation  Attention:  Good  Concentration:  Good  Memory:  WNL  Fund of knowledge:   Good  Insight:    Good  Judgment:   Good  Impulse Control:  Good   Risk Assessment: Danger to Self:   intrusive thoughts of self harm but stated nothing she would ever act on just disturbing to her Self-injurious Behavior: No Danger to Others: No Duty to Warn:no Physical Aggression /  Violence:No  Access to Firearms a concern: No  Gang Involvement:No   Subjective: With patient via virtual session.  Patient was a little late for session she explained that she had been up all night again because her days and nights are mixed up and only had a few hours of sleep prior to session.  Patient shared that she is having difficulty with motivation and being able to comprehend what she is reading.  Patient reported she is behind on 30 assignments in 1 class and has other assignments due on Wednesday for another class and she is not sure what she is going to do.  Encouraged patient to break down the situation into manageable pieces and to remind herself she is has to push through and not give herself an option to not do her assignments.  Discussed the importance of what she is telling herself and how she can do it if she just takes it 1 step at a time.  Patient was able to develop a plan that she reported feeling more positive about and agreed to try that.  She acknowledged that she does not need to worry about days and nights being messed up currently but would wait till after her tests are done.  She also shared that she is concerned that her medication may need to be changed but did not want to do anything currently.  She shared that her intrusive thoughts of self-harm were very disturbing to her just because they were bad visuals not because she thought she would  ever act on them.  Encouraged her to think back to when they started.  Patient could see that her intrusive thoughts seem to increase when she is very stressed and that seems to be where they are coming from.  Discussed CBT skill ST OP P and the importance of recognizing that her intrusive thoughts are just telling her her anxiety is hide she needs to complete her assignments so she does not have to think about it any longer.  The importance of doing some diaphragmatic breathing when she has some and recognizing that truth was discussed with  patient.  Interventions: Cognitive Behavioral Therapy and Solution-Oriented/Positive Psychology  Diagnosis:   ICD-10-CM   1. MDD (major depressive disorder), recurrent episode, mild (HCC)  F33.0       Plan: Patient is to use CBT and coping skills to decrease depression symptoms.  Patient is to follow plans from session to break things up into manageable pieces so that she can complete the task at her house.  Patient is also to work on using her intrusive thoughts as information that she needs to do some deep breathing and her stress management exercises.  Patient is to focus on the things that she can control fix and change.  Patient is to talk with mother about plans from session concerning communicating expectations for the house with her brother.  Patient is to take medication as directed. Long-term goal: Develop healthy cognitive patterns and believes about self in the world that lead to alleviation and help prevent the relapse of depression symptoms Short-term goal: Verbally identify if possible to source of depressed mood  Stevphen Meuse, [facility]

## 2021-12-17 ENCOUNTER — Other Ambulatory Visit (HOSPITAL_BASED_OUTPATIENT_CLINIC_OR_DEPARTMENT_OTHER): Payer: Self-pay

## 2022-01-07 ENCOUNTER — Other Ambulatory Visit (HOSPITAL_BASED_OUTPATIENT_CLINIC_OR_DEPARTMENT_OTHER): Payer: Self-pay

## 2022-01-07 ENCOUNTER — Ambulatory Visit (INDEPENDENT_AMBULATORY_CARE_PROVIDER_SITE_OTHER): Payer: Self-pay | Admitting: Psychiatry

## 2022-01-07 DIAGNOSIS — F330 Major depressive disorder, recurrent, mild: Secondary | ICD-10-CM

## 2022-01-07 NOTE — Progress Notes (Signed)
[facility] Counselor/Therapist Progress Note  Patient ID: [name], MRN: [id number],    Date: 01/07/2022  Time Spent: 46 minutes start time 3:01 PM end time 3:47 PM Virtual Visit via vjust audio video Note Connected with patient by a telemedicine/telehealth application, with their informed consent, and verified patient privacy and that I am speaking with the correct person using two identifiers. I discussed the limitations, risks, security and privacy concerns of performing psychotherapy and the availability of in person appointments. I also discussed with the patient that there may be a patient responsible charge related to this service. The patient expressed understanding and agreed to proceed. I discussed the treatment planning with the patient. The patient was provided an opportunity to ask questions and all were answered. The patient agreed with the plan and demonstrated an understanding of the instructions. The patient was advised to call  our office if  symptoms worsen or feel they are in a crisis state and need immediate contact.   Therapist Location: office Patient Location: home    Treatment Type: Individual Therapy  Reported Symptoms: sleep issues, fatigue, sadness, anxiety, triggered responses, mood issues, intrusive thoughts  Mental Status Exam:  Appearance:   NA     Behavior:  na  Motor:  na  Speech/Language:   Normal Rate  Affect:  NA  Mood:  sad  Thought process:  circumstantial  Thought content:    WNL  Sensory/Perceptual disturbances:    WNL  Orientation:  oriented to person, place, time/date, and situation  Attention:  Fair  Concentration:  Fair  Memory:  WNL  Fund of knowledge:   Fair  Insight:    Fair  Judgment:   Fair  Impulse Control:  Fair   Risk Assessment: Danger to Self:  No Self-injurious Behavior: No Danger to Others: No Duty to Warn:no Physical Aggression / Violence:No  Access to Firearms a concern: No  Gang Involvement:No    Subjective: Met with patient via virtual session but she did not want video just audio.  She shared that her mood is not good due to sleep schedule not being good and tapering off of her antidepressant. She was able to get A's in both of her classes and that is good. She shared her dad gave she and her brother lots of gifts for Christmas and it was a little overwhelming for her.  She did not have to have contact with him which was good. She and her mother did participate in activities and that was a good thing for her. They also decorated the house for Christmas which was positive. She shared that it has been nice to have some fun things after she got her classes completed. She shared that she is worried about her brother due to him starting school for the first time in 10 years. Discussed ways that she can support him. She shared that she has started coming off of her Lexapro but was not sure what to do with the [place]. Read instructions given to her by her provider Thayer Headings PMHNP.  She agreed to follow instructions as directed. Discussed trying to find positive things to get her brain engaged in regularly.   Interventions: Solution-Oriented/Positive Psychology and Insight-Oriented  Diagnosis:   ICD-10-CM   1. MDD (major depressive disorder), recurrent episode, mild ([place])  F33.0       Plan: Patient is to use CBT and coping skills to decrease depression symptoms.  Patient is to follow plans from session to  take medication as directed.  Patient is also to work on using her intrusive thoughts as information that she needs to do some deep breathing and her stress management exercises.  Patient is to focus on the things that she can control fix and change.  Patient is to talk with mother about plans from session concerning communicating expectations for the house with her brother.  Patient is to take medication as directed. Long-term goal: Develop healthy cognitive patterns and believes about self  in the world that lead to alleviation and help prevent the relapse of depression symptoms Short-term goal: Verbally identify if possible to source of depressed mood  [name], [facility]

## 2022-01-20 ENCOUNTER — Ambulatory Visit: Payer: Medicaid Other | Admitting: Psychiatry

## 2022-01-20 DIAGNOSIS — F330 Major depressive disorder, recurrent, mild: Secondary | ICD-10-CM | POA: Diagnosis not present

## 2022-01-20 NOTE — Progress Notes (Signed)
[facility] Counselor/Therapist Progress Note  Patient ID: [name], MRN: [id number],    Date: 01/20/2022  Time Spent: 28 minutes start time 4:02 PM end time 4:30 PM Virtual Visit via video note Connected with patient by a telemedicine/telehealth application, with their informed consent, and verified patient privacy and that I am speaking with the correct person using two identifiers. I discussed the limitations, risks, security and privacy concerns of performing psychotherapy and the availability of in person appointments. I also discussed with the patient that there may be a patient responsible charge related to this service. The patient expressed understanding and agreed to proceed. I discussed the treatment planning with the patient. The patient was provided an opportunity to ask questions and all were answered. The patient agreed with the plan and demonstrated an understanding of the instructions. The patient was advised to call  our office if  symptoms worsen or feel they are in a crisis state and need immediate contact.   Therapist Location: office Patient Location: home    Treatment Type: Individual Therapy  Reported Symptoms: anxiety, sadness, fatigue, health issues, low motivation, sleep issues  Mental Status Exam:              Speech/Language:   Normal Rate  Affect:  NA  Mood:  anxious  Thought process:  circumstantial  Thought content:    WNL  Sensory/Perceptual disturbances:    Pain  Orientation:  oriented to person, place, time/date, and situation  Attention:  Good  Concentration:  Good  Memory:  WNL  Fund of knowledge:   Good  Insight:    Good  Judgment:   Good  Impulse Control:  Good   Risk Assessment: Danger to Self:  No Self-injurious Behavior: No Danger to Others: No Duty to Warn:no Physical Aggression / Violence:No  Access to Firearms a concern: No  Gang Involvement:No   Subjective: Met with patient via virtual session.  Patient reported  she was not feeling well due to being on her menstrual cycle.  Patient explained she was in a lot of pain and had not been able to do much over the past few days.  Patient stated that she was trying to figure out potential insurance plans but was not able to think through them.  Encouraged patient to think about which providers are most important for her to see and to make decisions based on that information.  She shared that she wants to stay with her primary care physician so she is going to call the [facility] where her physician resides and find out which type of insurance they take that she has options to and then make a decision from there.  Patient was informed that if she does get Medicaid insurance that clinician's practice does not take that type of insurance so she would either need to pay the full price or seek a new clinician.  Patient ended session early due to feeling a migraine was coming on and that she could not focus and finish things.  Interventions: Solution-Oriented/Positive Psychology  Diagnosis:   ICD-10-CM   1. MDD (major depressive disorder), recurrent episode, mild ([place])  F33.0       Plan: Patient is to use CBT and coping skills to decrease depression symptoms.  Patient is to follow plans from session to figure out which insurance plan to take..  Patient is also to work on using her intrusive thoughts as information that she needs to do some deep breathing and her  stress management exercises.  Patient is to focus on the things that she can control fix and change.  Patient is to talk with mother about plans from session concerning communicating expectations for the house with her brother.  Patient is to take medication as directed. Long-term goal: Develop healthy cognitive patterns and believes about self in the world that lead to alleviation and help prevent the relapse of depression symptoms Short-term goal: Verbally identify if possible to source of depressed mood  [name],  [facility]

## 2022-01-26 ENCOUNTER — Other Ambulatory Visit: Payer: Self-pay

## 2022-01-26 ENCOUNTER — Telehealth: Payer: Self-pay | Admitting: Psychiatry

## 2022-01-26 DIAGNOSIS — F3342 Major depressive disorder, recurrent, in full remission: Secondary | ICD-10-CM

## 2022-01-26 DIAGNOSIS — F411 Generalized anxiety disorder: Secondary | ICD-10-CM

## 2022-01-26 MED ORDER — VILAZODONE HCL 20 MG PO TABS: 20.0000 mg | ORAL_TABLET | Freq: Every day | ORAL | 0 refills | Status: DC

## 2022-01-26 NOTE — Telephone Encounter (Signed)
No upcoming appt with [name] 1/24 is [facility]

## 2022-01-26 NOTE — Telephone Encounter (Signed)
Pt called at 1:20p. She would like request of Vilazadone HCL sent to  [facility] [address] [place], [place]. AT [place] [address] [place] [address] Phone: [phone]  Fax: [phone]   She is taking 20mg  and asked to have it refilled with a 20mg  pill, if that exists  Next appt 1/24

## 2022-01-26 NOTE — Telephone Encounter (Signed)
Rx sent 

## 2022-02-03 ENCOUNTER — Ambulatory Visit: Payer: Self-pay | Admitting: Psychiatry

## 2022-02-07 ENCOUNTER — Other Ambulatory Visit (HOSPITAL_BASED_OUTPATIENT_CLINIC_OR_DEPARTMENT_OTHER): Payer: Self-pay

## 2022-02-07 ENCOUNTER — Other Ambulatory Visit: Payer: Self-pay | Admitting: Physician Assistant

## 2022-02-07 DIAGNOSIS — G909 Disorder of the autonomic nervous system, unspecified: Secondary | ICD-10-CM

## 2022-02-08 ENCOUNTER — Other Ambulatory Visit (HOSPITAL_BASED_OUTPATIENT_CLINIC_OR_DEPARTMENT_OTHER): Payer: Self-pay

## 2022-02-08 MED ORDER — MIDODRINE HCL 5 MG PO TABS
5.0000 mg | ORAL_TABLET | Freq: Two times a day (BID) | ORAL | 0 refills | Status: DC
  Filled 2022-02-08: qty 60, 30d supply, fill #0

## 2022-02-09 ENCOUNTER — Other Ambulatory Visit (HOSPITAL_BASED_OUTPATIENT_CLINIC_OR_DEPARTMENT_OTHER): Payer: Self-pay

## 2022-02-09 ENCOUNTER — Telehealth: Payer: Self-pay | Admitting: Internal Medicine

## 2022-02-09 DIAGNOSIS — G909 Disorder of the autonomic nervous system, unspecified: Secondary | ICD-10-CM

## 2022-02-09 MED ORDER — MIDODRINE HCL 5 MG PO TABS: 5.0000 mg | ORAL_TABLET | Freq: Two times a day (BID) | ORAL | 0 refills | Status: DC

## 2022-02-09 NOTE — Telephone Encounter (Signed)
*  STAT* If patient is at the pharmacy, call can be transferred to refill team.   1. Which medications need to be refilled? (please list name of each medication and dose if known) midodrine (PROAMATINE) 5 MG tablet   2. Which pharmacy/location (including street and city if local pharmacy) is medication to be sent to?  [place]    3. Do they need a 30 day or 90 day supply? 23    Pt had office visit on 02/05/22

## 2022-02-09 NOTE — Telephone Encounter (Signed)
*  STAT* If patient is at the pharmacy, call can be transferred to refill team.   1. Which medications need to be refilled? (please list name of each medication and dose if known) midodrine (PROAMATINE) 5 MG tablet   2. Which pharmacy/location (including street and city if local pharmacy) is medication to be sent to?  [place]    3. Do they need a 30 day or 90 day supply?  90 day  Pt has scheduled appt on 04/27/22

## 2022-02-09 NOTE — Telephone Encounter (Signed)
Pt's medication was sent to pt's pharmacy as requested. Confirmation received.

## 2022-03-01 ENCOUNTER — Other Ambulatory Visit (HOSPITAL_BASED_OUTPATIENT_CLINIC_OR_DEPARTMENT_OTHER): Payer: Self-pay

## 2022-03-01 ENCOUNTER — Encounter (HOSPITAL_BASED_OUTPATIENT_CLINIC_OR_DEPARTMENT_OTHER): Payer: Self-pay

## 2022-03-01 ENCOUNTER — Telehealth: Payer: Self-pay | Admitting: Psychiatry

## 2022-03-01 MED ORDER — VILAZODONE HCL 20 MG PO TABS
20.0000 mg | ORAL_TABLET | Freq: Every day | ORAL | 0 refills | Status: DC
Start: 2022-03-01 — End: 2022-03-29
  Filled 2022-03-01: qty 30, 30d supply, fill #0

## 2022-03-01 NOTE — Telephone Encounter (Signed)
Sent!

## 2022-03-01 NOTE — Telephone Encounter (Signed)
Pt lvm and said that now she has insurance she needs the vilazodone 20 mg sent to [facility]  at [employer]. She only got a 30 day supply in January due to cost. So please just send in a 30 day supply

## 2022-03-14 DIAGNOSIS — R112 Nausea with vomiting, unspecified: Secondary | ICD-10-CM | POA: Diagnosis not present

## 2022-03-14 DIAGNOSIS — R109 Unspecified abdominal pain: Secondary | ICD-10-CM | POA: Diagnosis not present

## 2022-03-14 DIAGNOSIS — D72829 Elevated white blood cell count, unspecified: Secondary | ICD-10-CM | POA: Diagnosis not present

## 2022-03-14 DIAGNOSIS — R197 Diarrhea, unspecified: Secondary | ICD-10-CM | POA: Diagnosis not present

## 2022-03-26 ENCOUNTER — Telehealth: Payer: Self-pay | Admitting: Psychiatry

## 2022-03-26 NOTE — Telephone Encounter (Signed)
Recommend decreasing Viibryd to 20 mg 1/2 tab daily until next apt. Is there a medication she would like to re-try that she has been on previously? If not, recommend taking 1/2 tab of Vilazodone until upcoming apt when new medication options can be discussed.

## 2022-03-26 NOTE — Telephone Encounter (Signed)
Patient said she had been on so many medications and was looking for something different. Recommendation was given to her and she was okay with this.

## 2022-03-26 NOTE — Telephone Encounter (Signed)
Pt called to be seen sooner due to med issues with Vilazodone. Insurance not covering it. That's one issue. The other is side effects soft stool and more often. Not one to tolerate a lot of fluid loss. Will end up in ER. Apt 3/26. Pt out of meds 3/22. Would like to change meds. Advise pt @ [phone]

## 2022-03-29 ENCOUNTER — Telehealth: Payer: Self-pay | Admitting: Psychiatry

## 2022-03-29 ENCOUNTER — Other Ambulatory Visit (HOSPITAL_BASED_OUTPATIENT_CLINIC_OR_DEPARTMENT_OTHER): Payer: Self-pay

## 2022-03-29 ENCOUNTER — Other Ambulatory Visit: Payer: Self-pay

## 2022-03-29 DIAGNOSIS — F411 Generalized anxiety disorder: Secondary | ICD-10-CM

## 2022-03-29 DIAGNOSIS — F330 Major depressive disorder, recurrent, mild: Secondary | ICD-10-CM

## 2022-03-29 MED ORDER — VILAZODONE HCL 20 MG PO TABS
10.0000 mg | ORAL_TABLET | Freq: Every day | ORAL | 0 refills | Status: DC
  Filled 2022-03-29: qty 8, 8d supply, fill #0

## 2022-03-29 NOTE — Telephone Encounter (Signed)
[name] called at 9:45am to report that changing her anti-depressant by half is causing withdrawal symptoms.  She is very anxious, depressed, etc.  Will you call in enough medicine to get her to her appt 3/26? [place] on [address].

## 2022-03-29 NOTE — Telephone Encounter (Signed)
Script sent for 20 mg 1/2-1 tab po qd. She may resume 20 mg once daily if discontinuation symptoms are intolerable. Can discuss slower titration and/or cross-titration at apt to minimize discontinuation s/s.

## 2022-03-30 ENCOUNTER — Other Ambulatory Visit (HOSPITAL_BASED_OUTPATIENT_CLINIC_OR_DEPARTMENT_OTHER): Payer: Self-pay

## 2022-04-06 ENCOUNTER — Other Ambulatory Visit (HOSPITAL_BASED_OUTPATIENT_CLINIC_OR_DEPARTMENT_OTHER): Payer: Self-pay

## 2022-04-06 ENCOUNTER — Encounter: Payer: Self-pay | Admitting: Psychiatry

## 2022-04-06 ENCOUNTER — Telehealth (INDEPENDENT_AMBULATORY_CARE_PROVIDER_SITE_OTHER): Payer: Medicaid Other | Admitting: Psychiatry

## 2022-04-06 DIAGNOSIS — F5101 Primary insomnia: Secondary | ICD-10-CM | POA: Diagnosis not present

## 2022-04-06 DIAGNOSIS — F411 Generalized anxiety disorder: Secondary | ICD-10-CM

## 2022-04-06 DIAGNOSIS — F331 Major depressive disorder, recurrent, moderate: Secondary | ICD-10-CM | POA: Diagnosis not present

## 2022-04-06 MED ORDER — VILAZODONE HCL 10 MG PO TABS
ORAL_TABLET | ORAL | 0 refills | Status: DC
  Filled 2022-04-06: qty 30, 30d supply, fill #0

## 2022-04-06 MED ORDER — PAROXETINE HCL 20 MG PO TABS
20.0000 mg | ORAL_TABLET | Freq: Every day | ORAL | 0 refills | Status: DC
  Filled 2022-04-06 – 2022-04-28 (×2): qty 30, 30d supply, fill #0

## 2022-04-06 MED ORDER — PAROXETINE HCL 10 MG PO TABS
ORAL_TABLET | ORAL | 0 refills | Status: DC
  Filled 2022-04-06: qty 30, 30d supply, fill #0

## 2022-04-06 MED ORDER — LAMOTRIGINE 200 MG PO TABS
200.0000 mg | ORAL_TABLET | Freq: Every day | ORAL | 0 refills | Status: DC
  Filled 2022-04-06 – 2022-06-20 (×2): qty 90, 90d supply, fill #0

## 2022-04-06 NOTE — Progress Notes (Signed)
[name] [id number] August 30, 1990 32 y.o.  Virtual Visit via Video Note  I connected with pt @ on 04/06/22 at  8:30 AM EDT by a video enabled telemedicine application and verified that I am speaking with the correct person using two identifiers.   I discussed the limitations of evaluation and management by telemedicine and the availability of in person appointments. The patient expressed understanding and agreed to proceed.  I discussed the assessment and treatment plan with the patient. The patient was provided an opportunity to ask questions and all were answered. The patient agreed with the plan and demonstrated an understanding of the instructions.   The patient was advised to call back or seek an in-person evaluation if the symptoms worsen or if the condition fails to improve as anticipated.  I provided 20 minutes of non-face-to-face time during this encounter.  The patient was located at home.  The provider was located at [place].   Thayer Headings, PMHNP   Subjective:   Patient ID:  [name] is a 32 y.o. (DOB [birth year]) female.  Chief Complaint:  Chief Complaint  Patient presents with   Depression   Anxiety   Insomnia    HPI [name] presents for follow-up of anxiety, depression, and insomnia.   She reports feeling "not good- depressed, semi-suicidal, anxious... loss of appetite." She reports that she has been feeling "anxious, panicky" with occasional shortness of breath.  She reports that her mood and anxiety significantly worsened when she had a severe case of food poisoning after her grandmother's funeral and reports that she was not able to keep medications down.   She reports some worry about her mother after death of grandmother and subsequent family stressors. Grandmother lived in [place]. Reports that funeral was stressful and they missed their flight the next day as a result.   She reports that Viibryd was helping some with intrusive  thoughts, however she would sometimes have GI side effects at times immediately with eating. She was not able to take it when she had food poisoning and reports significant worsening in mood and anxiety at that time. Now taking Viibryd 20 mg for about 2 days- "A little better than it was, but I am still not doing well."  She reports that her energy and motivation have been low. She reports that she is not sleeping well, with difficulty falling and staying asleep. Having to force herself to eat. Concentration has been difficult. She reports difficulty focusing on coursework. Reports some difficulty with school. Denies decreased interest. She reports that she has intrusive thoughts of not wanting to live. Denies suicidal intent.   Has been taking Alprazolam prn more often.  Considering having a sleep study.   Would like to avoid the following side effects- dizziness, n/v, and diarrhea  Past Psychiatric Medication Trials: Xanax Lamictal Daytrana-Rash Ritalin [place]o Concerta Vyvanse Sertraline- Effective and then no longer effective Prozac- Adverse effects Lexapro- Effective and then stopped working Cymbalta- orthostasis, vision changes Wellbutrin Buspar Trazodone- Increased dizziness Temazepam Remeron Belsomra-Helpful for sleep initiation. Had middle of the night awakenings  Review of Systems:  Review of Systems  Gastrointestinal:  Positive for diarrhea.  Musculoskeletal:  Negative for gait problem.  Psychiatric/Behavioral:         Please refer to HPI    Medications: I have reviewed the patient's current medications.  Current Outpatient Medications  Medication Sig Dispense Refill   levonorgestrel (KYLEENA) 19.5 MG IUD 1 each by Intrauterine route once.  LORATADINE PO Take 10 mg by mouth daily.     Meclizine HCl (BONINE PO) Take by mouth daily as needed.      midodrine (PROAMATINE) 5 MG tablet Take 1 tablet (5 mg total) by mouth 2 (two) times daily with a meal. 60 tablet 0    midodrine (PROAMATINE) 5 MG tablet Take 1 tablet (5 mg total) by mouth 2 (two) times daily with a meal. 180 tablet 0   Multiple Vitamins-Minerals (MULTIVITAL) tablet Take 1 tablet by mouth daily. Gummy     Naproxen Sodium (ALEVE PO) Take 2 tablets by mouth daily as needed (for pain).     PARoxetine (PAXIL) 10 MG tablet Take 1/2 tablet at bedtime daily for 5 days, then increase to 1 tablet daily for 5 days, then increase to 2 tablets daily 30 tablet 0   PARoxetine (PAXIL) 20 MG tablet Take 1 tablet (20 mg total) by mouth daily. 30 tablet 0   traZODone (DESYREL) 100 MG tablet Take 1/2-1 tablet by mouth at bedtime for insomnia 90 tablet 3   Vilazodone HCl (VIIBRYD) 10 MG TABS Take 1.5 tablets daily with food for 5 days, then decrease to 1 tablet daily for 5 days, then decrease to 1/2 tablet daily for 5 days, then stop. 30 tablet 0   Vilazodone HCl 20 MG TABS Take 0.5-1 tablets (10-20 mg total) by mouth daily. 8 tablet 0   ALPRAZolam (XANAX) 0.5 MG tablet Take 1 tablet (0.5 mg total) by mouth as needed for anxiety or sleep. 30 tablet 3   lamoTRIgine (LAMICTAL) 200 MG tablet Take 1 tablet (200 mg total) by mouth daily. 90 tablet 0   No current facility-administered medications for this visit.    Medication Side Effects: Other: Diarrhea  Allergies:  Allergies  Allergen Reactions   Cymbalta [Duloxetine Hcl] Other (See Comments)    Hypotension   Daytrana [Methylphenidate] Rash    Like a sunburn   Duloxetine Other (See Comments)    Fainting    Past Medical History:  Diagnosis Date   ADHD (attention deficit hyperactivity disorder)    Allergy    Anxiety    Depression    Environmental allergies    GERD (gastroesophageal reflux disease)    Myopia of both eyes    Obesity    Syncope and collapse     Family History  Problem Relation Age of Onset   Depression Mother    Hyperlipidemia Mother    Sleep apnea Mother    Bipolar disorder Father    Diabetes Father    Hyperlipidemia Father     Bipolar disorder Brother    Breast cancer Maternal Aunt    Bipolar disorder Paternal Aunt    Heart disease Maternal Grandfather    Hyperlipidemia Maternal Grandfather    Diabetes Maternal Grandmother    Heart disease Maternal Grandmother    Hyperlipidemia Maternal Grandmother    Hypertension Maternal Grandmother    Cancer Paternal Grandfather    Diabetes Paternal Grandfather    Heart attack Paternal Grandfather    Sleep apnea Paternal Grandmother    Heart disease Paternal Grandmother    Hypertension Paternal Grandmother    Epilepsy Cousin     Social History   Socioeconomic History   Marital status: Single    Spouse name: Not on file   Number of children: Not on file   Years of education: Not on file   Highest education level: Not on file  Occupational History   Occupation: Ship broker  Tobacco Use  Smoking status: Never   Smokeless tobacco: Never  Vaping Use   Vaping Use: Never used  Substance and Sexual Activity   Alcohol use: Yes    Comment: Occasionaly   Drug use: No   Sexual activity: Never    Birth control/protection: I.U.D.  Other Topics Concern   Not on file  Social History Narrative   Lives with mom   For fun: plays video games   Social Determinants of Health   Financial Resource Strain: Not on file  Food Insecurity: Not on file  Transportation Needs: Not on file  Physical Activity: Not on file  Stress: Not on file  Social Connections: Not on file  Intimate Partner Violence: Not on file    Past Medical History, Surgical history, Social history, and Family history were reviewed and updated as appropriate.   Please see review of systems for further details on the patient's review from today.   Objective:   Physical Exam:  There were no vitals taken for this visit.  Physical Exam Neurological:     Mental Status: She is alert and oriented to person, place, and time.     Cranial Nerves: No dysarthria.  Psychiatric:        Attention and  Perception: Attention and perception normal.        Mood and Affect: Mood is anxious and depressed.        Speech: Speech normal.        Behavior: Behavior is cooperative.        Thought Content: Thought content normal. Thought content is not paranoid or delusional. Thought content does not include homicidal or suicidal ideation. Thought content does not include homicidal or suicidal plan.        Cognition and Memory: Cognition and memory normal.        Judgment: Judgment normal.     Comments: Insight intact     Lab Review:  No results found for: "NA", "K", "CL", "CO2", "GLUCOSE", "BUN", "CREATININE", "CALCIUM", "PROT", "ALBUMIN", "AST", "ALT", "ALKPHOS", "BILITOT", "GFRNONAA", "GFRAA"  No results found for: "WBC", "RBC", "HGB", "HCT", "PLT", "MCV", "MCH", "MCHC", "RDW", "LYMPHSABS", "MONOABS", "EOSABS", "BASOSABS"  No results found for: "POCLITH", "LITHIUM"   No results found for: "PHENYTOIN", "PHENOBARB", "VALPROATE", "CBMZ"   .res Assessment: Plan:    Pt seen for 20 minutes and time spent discussing possible treatment options since she reports intolerable GI side effects and breakthrough anxiety and depressive symptoms with Viibryd. Discussed potential benefits, risks, and side effects of Paxil and Celexa. Also discussed option of re-starting Sertraline since Sertraline was effective in the past and then lost efficacy over time. She reports that she would like to start Paxil for anxiety and depression due to lower risk of GI side effects. Discussed cross-titration of medications to minimize risk of discontinuation and possible side effects. Will start Paxil 5 mg daily for 5 days, then 10 mg daily for 5 days, then 20 mg daily.  Will decrease Viibryd to 15 mg daily for 5 days, then 10 mg daily for 5 days, then 5 mg for 5 days, then stop.  Continue Lamictal 200 mg daily for mood symptoms.  Continue Xanax 0.5 mg po qd prn anxiety.  Continue Trazodone 100 mg 1/2-1 tab po QHS prn insomnia.   Discussed considering Belsomra if insomnia does not improve.  Recommend continuing therapy with [name], [facility].  Recommended follow-up in 6-8 weeks. Pt reports that she would like to extend follow-up to 6 months due to provider being out  of network and agrees to follow-up sooner if symptoms do not improve or worsen.  Recommend continuing therapy with [name], [facility].  Patient advised to contact office with any questions, adverse effects, or acute worsening in signs and symptoms.   [name] was seen today for depression, anxiety and insomnia.  Diagnoses and all orders for this visit:  Major depressive disorder, recurrent episode, moderate (HCC) -     PARoxetine (PAXIL) 10 MG tablet; Take 1/2 tablet at bedtime daily for 5 days, then increase to 1 tablet daily for 5 days, then increase to 2 tablets daily -     Vilazodone HCl (VIIBRYD) 10 MG TABS; Take 1.5 tablets daily with food for 5 days, then decrease to 1 tablet daily for 5 days, then decrease to 1/2 tablet daily for 5 days, then stop. -     PARoxetine (PAXIL) 20 MG tablet; Take 1 tablet (20 mg total) by mouth daily. -     lamoTRIgine (LAMICTAL) 200 MG tablet; Take 1 tablet (200 mg total) by mouth daily.  Anxiety state -     PARoxetine (PAXIL) 10 MG tablet; Take 1/2 tablet at bedtime daily for 5 days, then increase to 1 tablet daily for 5 days, then increase to 2 tablets daily -     Vilazodone HCl (VIIBRYD) 10 MG TABS; Take 1.5 tablets daily with food for 5 days, then decrease to 1 tablet daily for 5 days, then decrease to 1/2 tablet daily for 5 days, then stop. -     PARoxetine (PAXIL) 20 MG tablet; Take 1 tablet (20 mg total) by mouth daily.  Primary insomnia     Please see After Visit Summary for patient specific instructions.  Future Appointments  Date Time Provider [place]  04/27/2022  9:45 AM Evans Lance, MD [facility]    No orders of the defined types were placed in this encounter.      -------------------------------

## 2022-04-26 ENCOUNTER — Other Ambulatory Visit: Payer: Self-pay | Admitting: Psychiatry

## 2022-04-26 DIAGNOSIS — F411 Generalized anxiety disorder: Secondary | ICD-10-CM

## 2022-04-26 DIAGNOSIS — F331 Major depressive disorder, recurrent, moderate: Secondary | ICD-10-CM

## 2022-04-27 ENCOUNTER — Ambulatory Visit: Payer: Medicaid Other | Attending: Internal Medicine | Admitting: Internal Medicine

## 2022-04-27 ENCOUNTER — Encounter: Payer: Self-pay | Admitting: Internal Medicine

## 2022-04-27 ENCOUNTER — Other Ambulatory Visit (HOSPITAL_BASED_OUTPATIENT_CLINIC_OR_DEPARTMENT_OTHER): Payer: Self-pay

## 2022-04-27 VITALS — BP 112/68 | HR 90 | Ht 62.0 in | Wt 194.0 lb

## 2022-04-27 DIAGNOSIS — G909 Disorder of the autonomic nervous system, unspecified: Secondary | ICD-10-CM | POA: Diagnosis not present

## 2022-04-27 DIAGNOSIS — R55 Syncope and collapse: Secondary | ICD-10-CM | POA: Diagnosis not present

## 2022-04-27 MED ORDER — MIDODRINE HCL 5 MG PO TABS
5.0000 mg | ORAL_TABLET | Freq: Two times a day (BID) | ORAL | 0 refills | Status: DC
  Filled 2022-04-27 (×2): qty 60, 30d supply, fill #0
  Filled 2022-06-20: qty 60, 30d supply, fill #1
  Filled 2022-08-16: qty 60, 30d supply, fill #2

## 2022-04-27 NOTE — Patient Instructions (Addendum)
Medication Instructions:  Your physician recommends that you continue on your current medications as directed. Please refer to the Current Medication list given to you today.  REFILL of Midodrine 5 mg sent to Pharmacy.  You will-   Take 1 tablet (5 mg total) by mouth 2 (two) times daily with a meal.   *If you need a refill on your cardiac medications before your next appointment, please call your pharmacy*  Lab Work: None ordered.  If you have labs (blood work) drawn today and your tests are completely normal, you will receive your results only by: MyChart Message (if you have MyChart) OR A paper copy in the mail If you have any lab test that is abnormal or we need to change your treatment, we will call you to review the results.  Testing/Procedures: None ordered.  Follow-Up: At [facility], you and your health needs are our priority.  As part of our continuing mission to provide you with exceptional heart care, we have created designated Provider Care Teams.  These Care Teams include your primary Cardiologist (physician) and Advanced Practice Providers (APPs -  Physician Assistants and Nurse Practitioners) who all work together to provide you with the care you need, when you need it.   Your next appointment:   1 year(s)  The format for your next appointment:   In Person  Provider:   Lewayne Bunting, MD{or one of the following Advanced Practice Providers on your designated Care Team:   Francis Dowse, [place] Casimiro Needle "[facility]" [place], [place]      \

## 2022-04-27 NOTE — Progress Notes (Signed)
HPI [name] returns today for followup. She is a pleasant 32 yo woman with autonomic dysfunction as well as other medical and psychiatric problems who I initially saw in person several years ago, and since only virtually.  The patient wore a cardiac monitor which showed no arrhythmias. She has not had frank syncope since I saw her last. She has had a couple of spells where she had to lie down quickly, avoiding passing out. She has started exercising and feels a bit better though she still has occasional episodes where she feels poorly. On bid midodrine she has HA's.   Allergies  Allergen Reactions   Cymbalta [Duloxetine Hcl] Other (See Comments)    Hypotension   Daytrana [Methylphenidate] Rash    Like a sunburn   Duloxetine Other (See Comments)    Fainting     Current Outpatient Medications  Medication Sig Dispense Refill   lamoTRIgine (LAMICTAL) 200 MG tablet Take 1 tablet (200 mg total) by mouth daily. 90 tablet 0   levonorgestrel (KYLEENA) 19.5 MG IUD 1 each by Intrauterine route once.     LORATADINE PO Take 10 mg by mouth daily.     Meclizine HCl (BONINE PO) Take by mouth daily as needed.      midodrine (PROAMATINE) 5 MG tablet Take 1 tablet (5 mg total) by mouth 2 (two) times daily with a meal. 60 tablet 0   Multiple Vitamins-Minerals (MULTIVITAL) tablet Take 1 tablet by mouth daily. Gummy     Naproxen Sodium (ALEVE PO) Take 2 tablets by mouth daily as needed (for pain).     PARoxetine (PAXIL) 10 MG tablet Take 1/2 tablet at bedtime daily for 5 days, then increase to 1 tablet daily for 5 days, then increase to 2 tablets daily 30 tablet 0   PARoxetine (PAXIL) 20 MG tablet Take 1 tablet (20 mg total) by mouth daily. 30 tablet 0   traZODone (DESYREL) 100 MG tablet Take 1/2-1 tablet by mouth at bedtime for insomnia 90 tablet 3   ALPRAZolam (XANAX) 0.5 MG tablet Take 1 tablet (0.5 mg total) by mouth as needed for anxiety or sleep. 30 tablet 3   midodrine (PROAMATINE) 5 MG tablet  Take 1 tablet (5 mg total) by mouth 2 (two) times daily with a meal. 180 tablet 0   No current facility-administered medications for this visit.     Past Medical History:  Diagnosis Date   ADHD (attention deficit hyperactivity disorder)    Allergy    Anxiety    Depression    Environmental allergies    GERD (gastroesophageal reflux disease)    Myopia of both eyes    Obesity    Syncope and collapse     ROS:   All systems reviewed and negative except as noted in the HPI.   History reviewed. No pertinent surgical history.   Family History  Problem Relation Age of Onset   Depression Mother    Hyperlipidemia Mother    Sleep apnea Mother    Bipolar disorder Father    Diabetes Father    Hyperlipidemia Father    Bipolar disorder Brother    Breast cancer Maternal Aunt    Bipolar disorder Paternal Aunt    Heart disease Maternal Grandfather    Hyperlipidemia Maternal Grandfather    Diabetes Maternal Grandmother    Heart disease Maternal Grandmother    Hyperlipidemia Maternal Grandmother    Hypertension Maternal Grandmother    Cancer Paternal Grandfather    Diabetes  Paternal Grandfather    Heart attack Paternal Grandfather    Sleep apnea Paternal Grandmother    Heart disease Paternal Grandmother    Hypertension Paternal Grandmother    Epilepsy Cousin      Social History   Socioeconomic History   Marital status: Single    Spouse name: Not on file   Number of children: Not on file   Years of education: Not on file   Highest education level: Not on file  Occupational History   Occupation: Student  Tobacco Use   Smoking status: Never   Smokeless tobacco: Never  Vaping Use   Vaping Use: Never used  Substance and Sexual Activity   Alcohol use: Yes    Comment: Occasionaly   Drug use: No   Sexual activity: Never    Birth control/protection: I.U.D.  Other Topics Concern   Not on file  Social History Narrative   Lives with mom   For fun: plays video games    Social Determinants of Health   Financial Resource Strain: Not on file  Food Insecurity: Not on file  Transportation Needs: Not on file  Physical Activity: Not on file  Stress: Not on file  Social Connections: Not on file  Intimate Partner Violence: Not on file     BP 112/68   Pulse 90   Ht  (1.575 m)   Wt 194 lb (88 kg)   SpO2 97%   BMI 35.48 kg/m   Physical Exam:  Well appearing NAD HEENT: Unremarkable Neck:  No JVD, no thyromegally Lymphatics:  No adenopathy Back:  No CVA tenderness Lungs:  Clear with no wheezes HEART:  Regular rate rhythm, no murmurs, no rubs, no clicks Abd:  soft, positive bowel sounds, no organomegally, no rebound, no guarding Ext:  2 plus pulses, no edema, no cyanosis, no clubbing Skin:  No rashes no nodules Neuro:  CN II through XII intact, motor grossly intact  EKG -nsr  Assess/Plan:  1. Autonomic dysfunction - she is still having some spells. I have discussed the mechanism of her symptoms with the patient. She will continue midodrine and I encouraged her to exercise 2. Obesity - she is encouraged to increase her physical activity.  3. Palpitations - she has not had any symptomatic SVT. Will follow. These appear to be improved.   [name],M.D.

## 2022-04-28 ENCOUNTER — Other Ambulatory Visit (HOSPITAL_BASED_OUTPATIENT_CLINIC_OR_DEPARTMENT_OTHER): Payer: Self-pay

## 2022-04-28 MED ORDER — PAROXETINE HCL 10 MG PO TABS
20.0000 mg | ORAL_TABLET | Freq: Every day | ORAL | 0 refills | Status: DC
  Filled 2022-04-28: qty 180, 90d supply, fill #0

## 2022-05-13 ENCOUNTER — Other Ambulatory Visit (HOSPITAL_COMMUNITY): Payer: Self-pay

## 2022-05-17 ENCOUNTER — Other Ambulatory Visit: Payer: Self-pay | Admitting: Psychiatry

## 2022-05-17 DIAGNOSIS — F331 Major depressive disorder, recurrent, moderate: Secondary | ICD-10-CM

## 2022-05-17 DIAGNOSIS — F411 Generalized anxiety disorder: Secondary | ICD-10-CM

## 2022-05-18 ENCOUNTER — Other Ambulatory Visit (HOSPITAL_BASED_OUTPATIENT_CLINIC_OR_DEPARTMENT_OTHER): Payer: Self-pay

## 2022-05-18 MED ORDER — PAROXETINE HCL 20 MG PO TABS
20.0000 mg | ORAL_TABLET | Freq: Every day | ORAL | 0 refills | Status: DC
  Filled 2022-05-18 – 2022-05-21 (×3): qty 90, 90d supply, fill #0

## 2022-05-21 ENCOUNTER — Other Ambulatory Visit (HOSPITAL_BASED_OUTPATIENT_CLINIC_OR_DEPARTMENT_OTHER): Payer: Self-pay

## 2022-05-21 ENCOUNTER — Other Ambulatory Visit: Payer: Self-pay

## 2022-06-09 ENCOUNTER — Ambulatory Visit
Admission: EM | Admit: 2022-06-09 | Discharge: 2022-06-09 | Disposition: A | Discharge status: Home / Self Care | Payer: Medicaid Other | Attending: Urgent Care | Admitting: Urgent Care

## 2022-06-09 ENCOUNTER — Telehealth: Payer: Self-pay | Admitting: Physician Assistant

## 2022-06-09 ENCOUNTER — Other Ambulatory Visit (HOSPITAL_BASED_OUTPATIENT_CLINIC_OR_DEPARTMENT_OTHER): Payer: Self-pay

## 2022-06-09 DIAGNOSIS — R195 Other fecal abnormalities: Secondary | ICD-10-CM

## 2022-06-09 DIAGNOSIS — K5900 Constipation, unspecified: Secondary | ICD-10-CM | POA: Insufficient documentation

## 2022-06-09 DIAGNOSIS — K921 Melena: Secondary | ICD-10-CM | POA: Diagnosis not present

## 2022-06-09 DIAGNOSIS — K644 Residual hemorrhoidal skin tags: Secondary | ICD-10-CM | POA: Insufficient documentation

## 2022-06-09 MED ORDER — HYDROCORTISONE ACETATE 25 MG RE SUPP
25.0000 mg | Freq: Two times a day (BID) | RECTAL | 0 refills | Status: DC
  Filled 2022-06-09: qty 12, 6d supply, fill #0

## 2022-06-09 NOTE — ED Triage Notes (Signed)
Pt reports she had some "jelly blood" in stools in the pass 12 hrs, 2 times.   Pt is concern for shigella, as she had potatoes salad 5 days ago.

## 2022-06-09 NOTE — ED Notes (Addendum)
In with Mani, PA-C for exam 

## 2022-06-09 NOTE — ED Provider Notes (Addendum)
[facility]  Note:  This document was prepared using Conservation officer, historic buildings and may include unintentional dictation errors.  MRN: [id number] DOB: 1990-07-24  Subjective:   [name] is a 32 y.o. female presenting for acute onset of bloody stools today.  Noticed two distinct episodes.  Denies fever, nausea, vomiting, abdominal pain.  She does have concerns for an infectious process.  Does not have diarrhea but does endorse loose stools.  She generally has constipation.  No fever, recent antibiotic use, hospitalizations or long distance travel.  Has not eaten raw foods, drank unfiltered water.  No history of GI disorders including Crohn's, IBS, ulcerative colitis.  However she does have a primary concern that she ate a potato salad 5 days ago.  Lastly, in March she was seen at a different facility, requested review of the visit for today's concerns.  For that particular episode she had more GI symptoms including nausea, vomiting, diarrhea and abdominal cramping.  Imaging was performed and is included below.  No current facility-administered medications for this encounter.  Current Outpatient Medications:    ALPRAZolam (XANAX) 0.5 MG tablet, Take 1 tablet (0.5 mg total) by mouth as needed for anxiety or sleep., Disp: 30 tablet, Rfl: 3   lamoTRIgine (LAMICTAL) 200 MG tablet, Take 1 tablet (200 mg total) by mouth daily., Disp: 90 tablet, Rfl: 0   levonorgestrel (KYLEENA) 19.5 MG IUD, 1 each by Intrauterine route once., Disp: , Rfl:    LORATADINE PO, Take 10 mg by mouth daily., Disp: , Rfl:    Meclizine HCl (BONINE PO), Take by mouth daily as needed. , Disp: , Rfl:    midodrine (PROAMATINE) 5 MG tablet, Take 1 tablet (5 mg total) by mouth 2 (two) times daily with a meal., Disp: 60 tablet, Rfl: 0   midodrine (PROAMATINE) 5 MG tablet, Take 1 tablet (5 mg total) by mouth 2 (two) times daily with a meal., Disp: 180 tablet, Rfl: 0   Multiple Vitamins-Minerals  (MULTIVITAL) tablet, Take 1 tablet by mouth daily. Gummy, Disp: , Rfl:    Naproxen Sodium (ALEVE PO), Take 2 tablets by mouth daily as needed (for pain)., Disp: , Rfl:    PARoxetine (PAXIL) 10 MG tablet, Take 2 tablets (20 mg total) by mouth daily., Disp: 180 tablet, Rfl: 0   PARoxetine (PAXIL) 20 MG tablet, Take 1 tablet (20 mg total) by mouth daily., Disp: 90 tablet, Rfl: 0   traZODone (DESYREL) 100 MG tablet, Take 1/2-1 tablet by mouth at bedtime for insomnia, Disp: 90 tablet, Rfl: 3   Allergies  Allergen Reactions   Cymbalta [Duloxetine Hcl] Other (See Comments)    Hypotension   Daytrana [Methylphenidate] Rash    Like a sunburn   Duloxetine Other (See Comments)    Fainting    Past Medical History:  Diagnosis Date   ADHD (attention deficit hyperactivity disorder)    Allergy    Anxiety    Depression    Environmental allergies    GERD (gastroesophageal reflux disease)    Myopia of both eyes    Obesity    Syncope and collapse      History reviewed. No pertinent surgical history.  Family History  Problem Relation Age of Onset   Depression Mother    Hyperlipidemia Mother    Sleep apnea Mother    Bipolar disorder Father    Diabetes Father    Hyperlipidemia Father    Colon polyps Father    Bipolar disorder Brother  Diabetes Maternal Grandmother    Heart disease Maternal Grandmother    Hyperlipidemia Maternal Grandmother    Hypertension Maternal Grandmother    Heart disease Maternal Grandfather    Hyperlipidemia Maternal Grandfather    Sleep apnea Paternal Grandmother    Heart disease Paternal Grandmother    Hypertension Paternal Grandmother    Cancer Paternal Grandfather    Diabetes Paternal Grandfather    Heart attack Paternal Grandfather    Colon cancer Paternal Grandfather    Breast cancer Maternal Aunt    Bipolar disorder Paternal Aunt    Epilepsy Cousin     Social History   Tobacco Use   Smoking status: Never   Smokeless tobacco: Never  Vaping Use    Vaping Use: Never used  Substance Use Topics   Alcohol use: Yes    Comment: Occasionaly   Drug use: No    ROS   Objective:   Vitals: BP 111/75 (BP Location: Left Arm)   Pulse 93   Temp 98.2 F (36.8 C) (Oral)   Resp 18   SpO2 95%   Physical Exam Constitutional:      General: She is not in acute distress.    Appearance: Normal appearance. She is well-developed. She is not ill-appearing, toxic-appearing or diaphoretic.  HENT:     Head: Normocephalic and atraumatic.     Nose: Nose normal.     Mouth/Throat:     Mouth: Mucous membranes are moist.  Eyes:     General: No scleral icterus.       Right eye: No discharge.        Left eye: No discharge.     Extraocular Movements: Extraocular movements intact.  Cardiovascular:     Rate and Rhythm: Normal rate.  Pulmonary:     Effort: Pulmonary effort is normal.  Genitourinary:   Skin:    General: Skin is warm and dry.  Neurological:     General: No focal deficit present.     Mental Status: She is alert and oriented to person, place, and time.  Psychiatric:        Mood and Affect: Mood normal.        Behavior: Behavior normal.    CT ABDOMEN PELVIS W/O CONTRAST  Anatomical Region Laterality Modality  Abdomen -- Computed Tomography   Impression  IMPRESSION:  1. Diffusely fluid-filled appearance of the stomach, small bowel, and colon. Fluid within the colon can be seen in the setting of diarrhea. Consider underlying viral gastroenteritis. Obstruction felt to be unlikely. NG tube decompression may be of benefit as clinically warranted. No pneumoperitoneum or drainable fluid collection. Please clinically correlate.  2. Intrauterine device in satisfactory position. Both ovaries are slightly enlarged. No adjacent inflammation. Might consider underlying etiologies such as polycystic ovarian syndrome. Further assessment with nonemergent pelvic ultrasound may be of benefit.  Findings discussed with Dr. Azell Der in the ER  03/14/2022 at 10:00 AM.  Assessment and Plan :   PDMP not reviewed this encounter.  1. Loose stools   2. Bloody stools   3. External hemorrhoids   4. Constipation, unspecified constipation type    External chart reviewed performed. Will pursue GI testing including the GI panel, C. difficile testing.  Patient does have obvious small hemorrhoids for which I am prescribing hydrocortisone suppositories.  Will update treatment plan based off of results.  Counseled patient on potential for adverse effects with medications prescribed/recommended today, ER and return-to-clinic precautions discussed, patient verbalized understanding.   Wallis Bamberg, PA-C 06/09/22  [id number]  

## 2022-06-09 NOTE — Telephone Encounter (Signed)
FYI: This call has been transferred to Access Nurse. Once the result note has been entered staff can address the message at that time.  Patient called in with the following symptoms:  Red Word: blood in stool and diarrhea; occurred twice since last night   Please advise at Mobile [phone] (mobile)  Message is routed to Provider Pool and [facility] Triage.

## 2022-06-09 NOTE — Discharge Instructions (Addendum)
Will let you know about your test results when they come back. There is a really good possibility that the blood in the stool you are seeing is related to external hemorrhoids.  For now, I would like you to start hydrocortisone suppositories to help with this for the next week.  In general for moderate to severe constipation (not having a bowel movement in more than 3 days) then try to use an enema or Miralax once daily until you have a good bowel movement.  It is not a good idea to use an enema or laxatives daily. If you find you are doing this, then please follow up with a gastroenterologist. Otherwise, a medication you could use daily to help with promoting bowel movements is docusate (Colace) 100mg . It is okay to use this 1-2 times daily as a stool softener.  Try to stay active physically including regular exercise 2-3 times a week.  Make sure you hydrate well every day with about 64 ounces of water daily (that is 2 liters).  Try to avoid carb heavy foods, dairy. This includes cutting out breads, pasta, pizza, pastries, potatoes, rice, starchy foods in general. Eat more fiber as listed below:  Salads - kale, spinach, cabbage, spring mix, arugula Fruits - avocadoes, berries (blueberries, raspberries, blackberries), apples, oranges, pomegranate, grapefruit, kiwi Vegetables - asparagus, cauliflower, broccoli, green beans, brussel sprouts, bell peppers, beets; stay away from or limit starchy vegetables like potatoes, carrots, peas Other general foods - kidney beans, egg whites, almonds, walnuts, sunflower seeds, pumpkin seeds, fat free yogurt, almond milk, flax seeds, quinoa, oats  Meat - It is better to eat lean meats and limit your red meat including pork to once a week.  Wild caught fish, chicken breast are good options as they tend to be leaner sources of good protein. Still be mindful of the sodium labels for the meats you buy.  DO NOT EAT ANY FOODS ON THIS LIST THAT YOU ARE ALLERGIC TO. For more  specific needs, I highly recommend consulting a dietician or nutritionist but this can definitely be a good starting point.

## 2022-06-10 ENCOUNTER — Telehealth: Payer: Self-pay | Admitting: Urgent Care

## 2022-06-10 ENCOUNTER — Other Ambulatory Visit (HOSPITAL_BASED_OUTPATIENT_CLINIC_OR_DEPARTMENT_OTHER): Payer: Self-pay

## 2022-06-10 LAB — C DIFFICILE QUICK SCREEN W PCR REFLEX
C Diff antigen: POSITIVE — AB
C Diff interpretation: DETECTED
C Diff toxin: POSITIVE — AB

## 2022-06-10 MED ORDER — VANCOMYCIN HCL 125 MG PO CAPS
125.0000 mg | ORAL_CAPSULE | Freq: Four times a day (QID) | ORAL | 0 refills | Status: DC
  Filled 2022-06-10: qty 24, 6d supply, fill #0
  Filled 2022-06-10: qty 40, 10d supply, fill #0
  Filled 2022-06-10: qty 16, 4d supply, fill #0
  Filled 2022-06-10: qty 24, 6d supply, fill #0
  Filled 2022-06-10: qty 16, 4d supply, fill #1
  Filled 2022-06-10: qty 16, 4d supply, fill #0

## 2022-06-10 NOTE — Telephone Encounter (Signed)
Critical result received from the lab this morning.  Patient tested positive for C. difficile.  Will start her on vancomycin.  Results communicated to the patient's mother who showed up with her in clinic yesterday.  She verbalized understanding, referral placed to infectious disease clinic for follow-up and further management.

## 2022-06-10 NOTE — Telephone Encounter (Signed)
Pt was seen at [facility] on 06/09/22  Patient Name First: [facility] Last: Diedrich Gender: Female DOB: 10/04/1990 Age: 32 Y 10 M 27 D Return Phone Number: [phone] (Primary) Address: City/ State/ Zip: [place]  [address] Client [facility], [facility] Provider Bufford Buttner, [place]- [place] Contact Type Call Who Is Calling Patient / Member / Family / Caregiver Call Type Triage / Clinical Relationship To Patient Self Return Phone Number [phone] (Primary) Chief Complaint SEVERE ABDOMINAL PAIN - Severe pain in abdomen Reason for Call Symptomatic / Request for Health Information Initial Comment The patient is has experienced some diarrhea and has blood in her stool twice since last night. Caller is having symtpoms of red mucus mucus in her stool. It was bright red. She's also having abdominal cramps and constipation. Her family has a high risk hsitory of colon cancer. The abdominal pain will be rock hard all the time and she will groan in agony. Translation No Nurse Assessment Nurse: Hassan Rowan, RN, Melissa Date/Time (Eastern Time): 06/09/2022 3:09:31 PM Confirm and document reason for call. If symptomatic, describe symptoms. ---The patient is has experienced some diarrhea and has blood in her stool twice since last night. Caller is having symptoms of red mucus in her stool, like red jelly. It was bright red. She's also having abdominal cramps and constipation. Her family has a high risk history of colon cancer. The abdominal pain will be rock hard all the time and she will groan in agony when things are moving through intestine Does the patient have any new or worsening symptoms? ---Yes Will a triage be completed? ---Yes Related visit to physician within the last 2 weeks? ---No Does the PT have any chronic conditions? (i.e. diabetes, asthma, this includes High risk factors for pregnancy,  etc.) ---Yes List chronic conditions. ---ANS-low blood pressure, dizziness, obesity Is the patient pregnant or possibly pregnant? (Ask all females between the ages of 58-55) ---No Nurse Assessment Is this a behavioral health or substance abuse call? ---No Guidelines Guideline Title Affirmed Question Affirmed Notes Nurse Date/Time (Eastern Time) Rectal Bleeding Patient sounds very sick or weak to the triager [place], RN, Efraim Kaufmann 06/09/2022 3:12:28 PM Disp. Time Lamount Cohen Time) Disposition Final User 06/09/2022 3:08:05 PM Send to Urgent Queue Lorre Munroe 06/09/2022 3:16:04 PM Go to ED Now (or PCP triage) Yes Hassan Rowan, RN, Melissa Final Disposition 06/09/2022 3:16:04 PM Go to ED Now (or PCP triage) Yes [place], RN, Melissa Caller Disagree/Comply Comply Caller Understands Yes PreDisposition InappropriateToAsk Care Advice Given Per Guideline GO TO ED NOW (OR PCP TRIAGE): * IF NO PCP (PRIMARY CARE PROVIDER) SECOND-LEVEL TRIAGE: You need to be seen within the next hour. Go to the ED/UCC at _____________ Hospital. Leave as soon as you can. * Please bring a list of your current medicines when you go to see the doctor. CARE ADVICE given per Rectal Bleeding (Adult) guideline. Comments User: Lauris Poag, RN Date/Time Lamount Cohen Time): 06/09/2022 3:17:34 PM instructed caller to be seen, can call back for new or worsening sx, verbalized understanding. caller states will find place where medicaid is accepted. recommendation for ED/PCP now due to caller reporting abd pain, blood and red mucus in stool Referrals GO TO FACILITY UNDECIDED

## 2022-06-10 NOTE — Telephone Encounter (Signed)
FYI, see Triage note. Pt was seen at [facility] yesterday.

## 2022-06-11 ENCOUNTER — Telehealth: Payer: Self-pay

## 2022-06-11 NOTE — Telephone Encounter (Signed)
Pt called this clinic asking if she should continue using the suppository if she has started the antibiotics. The pt is informed by this nurse that the suppositories will help with the bleeding from the hemorrhoids, so it is ok to take them and the antibiotic. The patient also asked if she can take a probiotic while taking antibiotics. The patient is encouraged to take a probiotic as it will help with the diarrhea while having c.diff and being on a probiotic. The patient was understanding.

## 2022-06-21 ENCOUNTER — Other Ambulatory Visit: Payer: Self-pay

## 2022-06-21 ENCOUNTER — Other Ambulatory Visit (HOSPITAL_BASED_OUTPATIENT_CLINIC_OR_DEPARTMENT_OTHER): Payer: Self-pay

## 2022-06-22 ENCOUNTER — Other Ambulatory Visit: Payer: Self-pay

## 2022-06-22 ENCOUNTER — Encounter: Payer: Self-pay | Admitting: Internal Medicine

## 2022-06-22 ENCOUNTER — Ambulatory Visit (INDEPENDENT_AMBULATORY_CARE_PROVIDER_SITE_OTHER): Payer: Medicaid Other | Admitting: Internal Medicine

## 2022-06-22 VITALS — BP 105/73 | HR 92 | Temp 98.1°F | Resp 16 | Wt 198.4 lb

## 2022-06-22 DIAGNOSIS — A0472 Enterocolitis due to Clostridium difficile, not specified as recurrent: Secondary | ICD-10-CM | POA: Diagnosis not present

## 2022-06-22 NOTE — Progress Notes (Signed)
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 F with PMHX of ADHD, anxiety/depression, GERD, obesity referred by emergency department for C. difficile infection.She had presented to the ED on 5/29 with r bloody stools.  Noted by she had 2 distinct episodes without nausea/vomiting, abdominal pain, fever.  Noted loose stools.  No history of recent antibiotic use/hospitalizations.  Noted that she had potato salad 5 days prior.  She had similar episode vack in March 2024 with nausea, vomiting, diarrhea, abdominal pain.  C. difficile testing returned positive for antigen and toxin.  She was prescribed vancomycin 125 mg p.o. 4 times daily x 10 days. Today 06/22/22: Pt has states diarrhea/ bloody stool stopped 5 days into treatment. Completed abx 2 days ago. Now has normal stools.  Denies prior episode of bloody stool.  Reports 12-13 episodes in 3 months, which only lasts once. Some times has constipation. Pt has had diarrhea/constipation 5-6 years. Pt is an Dealer.  Review of Systems  All other systems reviewed and are negative.   Past Medical History:  Diagnosis Date   ADHD (attention deficit hyperactivity disorder)    Allergy    Anxiety    Depression    Environmental allergies    GERD (gastroesophageal reflux disease)    Myopia of both eyes    Obesity    Syncope and collapse     Outpatient Medications Prior to Visit  Medication Sig Dispense Refill   ALPRAZolam (XANAX) 0.5 MG tablet Take 1 tablet (0.5 mg total) by mouth as needed for anxiety or sleep. 30 tablet 3   hydrocortisone  (ANUSOL-HC) 25 MG suppository Place 1 suppository (25 mg total) rectally 2 (two) times daily. 14 suppository 0   lamoTRIgine (LAMICTAL) 200 MG tablet Take 1 tablet (200 mg total) by mouth daily. 90 tablet 0   levonorgestrel (KYLEENA) 19.5 MG IUD 1 each by Intrauterine route once.     LORATADINE PO Take 10 mg by mouth daily.     Meclizine HCl (BONINE PO) Take by mouth daily as needed.      midodrine (PROAMATINE) 5 MG tablet Take 1 tablet (5 mg total) by mouth 2 (two) times daily with a meal. 60 tablet 0   midodrine (PROAMATINE) 5 MG tablet Take 1 tablet (5 mg total) by mouth 2 (two) times daily with a meal. 180 tablet 0   Multiple Vitamins-Minerals (MULTIVITAL) tablet Take 1 tablet by mouth daily. Gummy     Naproxen Sodium (ALEVE PO) Take 2 tablets by mouth daily as needed (for pain).     PARoxetine (PAXIL) 10 MG tablet Take 2 tablets (20 mg total) by mouth daily. 180 tablet 0   PARoxetine (PAXIL) 20 MG tablet Take 1 tablet (20 mg total) by mouth daily. 90 tablet 0   traZODone (DESYREL) 100 MG tablet Take 1/2-1 tablet by mouth at bedtime for insomnia 90 tablet 3   vancomycin (VANCOCIN) 125 MG capsule Take 1 capsule (125 mg total) by mouth 4 (four) times daily. 40 capsule 0   No facility-administered medications prior to visit.     Allergies  Allergen Reactions   Cymbalta [Duloxetine Hcl] Other (See Comments)    Hypotension   Daytrana [Methylphenidate] Rash    Like a sunburn   Duloxetine Other (See Comments)    Fainting    Social History   Tobacco Use   Smoking status: Never   Smokeless tobacco: Never  Vaping Use   Vaping Use: Never used  Substance Use Topics   Alcohol use: Yes    Comment: Occasionaly   Drug use: No    Family History  Problem Relation Age of Onset   Depression Mother    Hyperlipidemia Mother    Sleep apnea Mother    Bipolar disorder Father    Diabetes Father    Hyperlipidemia Father    Colon polyps Father    Bipolar disorder Brother    Diabetes  Maternal Grandmother    Heart disease Maternal Grandmother    Hyperlipidemia Maternal Grandmother    Hypertension Maternal Grandmother    Heart disease Maternal Grandfather    Hyperlipidemia Maternal Grandfather    Sleep apnea Paternal Grandmother    Heart disease Paternal Grandmother    Hypertension Paternal Grandmother    Cancer Paternal Grandfather    Diabetes Paternal Grandfather    Heart attack Paternal Grandfather    Colon cancer Paternal Grandfather    Breast cancer Maternal Aunt    Bipolar disorder Paternal Aunt    Epilepsy Cousin     Objective:  There were no vitals filed for this visit. There is no height or weight on file to calculate BMI.  Physical Exam Constitutional:      Appearance: Normal appearance.  HENT:     Head: Normocephalic and atraumatic.     Right Ear: Tympanic membrane normal.     Left Ear: Tympanic membrane normal.     Nose: Nose normal.     Mouth/Throat:     Mouth: Mucous membranes are moist.  Eyes:     Extraocular Movements: Extraocular movements intact.     Conjunctiva/sclera: Conjunctivae normal.     Pupils: Pupils are equal, round, and reactive to light.  Cardiovascular:     Rate and Rhythm: Normal rate and regular rhythm.     Heart sounds: No murmur heard.    No friction rub. No gallop.  Pulmonary:     Effort: Pulmonary effort is normal.     Breath sounds: Normal breath sounds.  Abdominal:     General: Abdomen is flat.     Palpations: Abdomen is soft.  Musculoskeletal:        General: Normal range of motion.  Skin:    General: Skin is warm and dry.  Neurological:     General: No focal deficit present.     Mental Status: She is alert and oriented to person, place, and time.  Psychiatric:        Mood and Affect: Mood normal.     Lab Results: No results found for: "WBC", "HGB", "HCT", "MCV", "PLT" No results found for: "CREATININE", "BUN", "NA", "K", "CL", "CO2" No results found for: "ALT", "AST", "GGT", "ALKPHOS", "BILITOT"    Assessment & Plan:  #Cdiff bloody stool/colitis #5-6 year history of diarrhea/constipation -06/09/22 Cdiff + toxin/Ag - Symptoms resolved on vancomycin, completed abx x 10 days(2 days ago) - Pt has not been on abx, PPI. 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  -I placed a referral to GI given over 5 year history of intermittent diarrhea and constipation with abdominal cramping. -F/U with  ID PRN Danelle Earthly, MD [facility]   06/22/22  5:30 AM   I have personally spent 65 minutes involved in face-to-face and non-face-to-face activities for this patient on the day of the visit. Professional time spent includes the following activities: Preparing to see the patient (review of tests), Obtaining and/or reviewing separately obtained history (admission/discharge record), Performing a medically appropriate examination and/or evaluation , Ordering medications/tests/procedures, referring and communicating with other health care professionals, Documenting clinical information in the EMR, Independently interpreting results (not separately reported), Communicating results to the patient/family/caregiver, Counseling and educating the patient/family/caregiver and Care coordination (not separately reported).

## 2022-07-20 ENCOUNTER — Other Ambulatory Visit (HOSPITAL_BASED_OUTPATIENT_CLINIC_OR_DEPARTMENT_OTHER): Payer: Self-pay

## 2022-07-20 ENCOUNTER — Ambulatory Visit (INDEPENDENT_AMBULATORY_CARE_PROVIDER_SITE_OTHER): Payer: Medicaid Other | Admitting: Physician Assistant

## 2022-07-20 ENCOUNTER — Other Ambulatory Visit: Payer: Self-pay | Admitting: Psychiatry

## 2022-07-20 ENCOUNTER — Encounter: Payer: Self-pay | Admitting: Gastroenterology

## 2022-07-20 VITALS — BP 100/70 | HR 84 | Temp 98.0°F | Ht 62.0 in | Wt 186.5 lb

## 2022-07-20 DIAGNOSIS — N838 Other noninflammatory disorders of ovary, fallopian tube and broad ligament: Secondary | ICD-10-CM | POA: Diagnosis not present

## 2022-07-20 DIAGNOSIS — R197 Diarrhea, unspecified: Secondary | ICD-10-CM

## 2022-07-20 DIAGNOSIS — R29818 Other symptoms and signs involving the nervous system: Secondary | ICD-10-CM

## 2022-07-20 DIAGNOSIS — F411 Generalized anxiety disorder: Secondary | ICD-10-CM

## 2022-07-20 MED ORDER — ALPRAZOLAM 0.5 MG PO TABS
0.5000 mg | ORAL_TABLET | ORAL | 3 refills | Status: DC | PRN
Start: 2022-07-20 — End: 2022-10-07
  Filled 2022-07-20: qty 30, 30d supply, fill #0
  Filled 2022-08-30: qty 30, 30d supply, fill #1

## 2022-07-20 NOTE — Patient Instructions (Signed)
It was great to see you!  I will place referral to Sleep Studies  [facility] sent you a MyChart message: "Please call our office at [phone] and choose option 1, to get this appointment scheduled."  I will be in touch with all results  If you feel worse - go to the ER

## 2022-07-20 NOTE — Progress Notes (Signed)
[name] is a 32 y.o. female here for a follow up of a pre-existing problem.  History of Present Illness:   Chief Complaint  Patient presents with   Diarrhea    Pt c/o diarrhea x 1 week again. She would like to be retested for C-Diff.   Snoring    Pt would like a sleep study    Diarrhea Tested positive for C diff on 06/06/22 -- she was prescribed vancomycin. She had no antibiotic(s) exposure prior to onset of symptom(s). She had a follow up appointment with her infectious disease specialist and found her symptoms had improved completely.  She complains of her diarrhea returning a week ago. She is also feeling lightheadedness, general abdominal pain for the past couple of days, and urinary frequency for the past couple of days.  She is trying to drink plenty of fluids but has to limit herself due to blood pressure drops.  She received a referral to a GI specialist but has not scheduled yet.   Snoring  She complains of snoring while sleeping.  She mother and father have a history of snoring.  She was snoring back in 2011 and was told she also sleep talks.  She has day time fatigue and does not feel well rested after waking up.  She developed headaches during the day frequently.   Enlarged ovaries Her last abdominal scan showed her ovaries are enlarged.  She is having significant cramping and bleeding with periods, which she did not have with prior IUD. She is planning on following up her issues with her GYN specialist.  She has a IUD placed in and reports no new issues.     Past Medical History:  Diagnosis Date   ADHD (attention deficit hyperactivity disorder)    Allergy    Anxiety    Depression    Environmental allergies    GERD (gastroesophageal reflux disease)    Myopia of both eyes    Obesity    Syncope and collapse      Social History   Tobacco Use   Smoking status: Never   Smokeless tobacco: Never  Vaping Use   Vaping Use: Never used  Substance Use  Topics   Alcohol use: Not Currently    Comment: Occasionaly   Drug use: No    No past surgical history on file.  Family History  Problem Relation Age of Onset   Depression Mother    Hyperlipidemia Mother    Sleep apnea Mother    Bipolar disorder Father    Diabetes Father    Hyperlipidemia Father    Colon polyps Father    Bipolar disorder Brother    Diabetes Maternal Grandmother    Heart disease Maternal Grandmother    Hyperlipidemia Maternal Grandmother    Hypertension Maternal Grandmother    Heart disease Maternal Grandfather    Hyperlipidemia Maternal Grandfather    Sleep apnea Paternal Grandmother    Heart disease Paternal Grandmother    Hypertension Paternal Grandmother    Cancer Paternal Grandfather    Diabetes Paternal Grandfather    Heart attack Paternal Grandfather    Colon cancer Paternal Grandfather    Breast cancer Maternal Aunt    Bipolar disorder Paternal Aunt    Epilepsy Cousin     Allergies  Allergen Reactions   Cymbalta [Duloxetine Hcl] Other (See Comments)    Hypotension   Daytrana [Methylphenidate] Rash    Like a sunburn   Duloxetine Other (See Comments)    Fainting  Current Medications:   Current Outpatient Medications:    ALPRAZolam (XANAX) 0.5 MG tablet, Take 1 tablet (0.5 mg total) by mouth as needed for anxiety or sleep., Disp: 30 tablet, Rfl: 3   lamoTRIgine (LAMICTAL) 200 MG tablet, Take 1 tablet (200 mg total) by mouth daily., Disp: 90 tablet, Rfl: 0   levonorgestrel (KYLEENA) 19.5 MG IUD, 1 each by Intrauterine route once., Disp: , Rfl:    LORATADINE PO, Take 10 mg by mouth daily., Disp: , Rfl:    Meclizine HCl (BONINE PO), Take by mouth daily as needed. , Disp: , Rfl:    midodrine (PROAMATINE) 5 MG tablet, Take 1 tablet (5 mg total) by mouth 2 (two) times daily with a meal., Disp: 180 tablet, Rfl: 0   Multiple Vitamins-Minerals (MULTIVITAL) tablet, Take 1 tablet by mouth daily. Gummy, Disp: , Rfl:    Naproxen Sodium (ALEVE PO),  Take 2 tablets by mouth daily as needed (for pain)., Disp: , Rfl:    PARoxetine (PAXIL) 20 MG tablet, Take 1 tablet (20 mg total) by mouth daily., Disp: 90 tablet, Rfl: 0   traZODone (DESYREL) 100 MG tablet, Take 1/2-1 tablet by mouth at bedtime for insomnia, Disp: 90 tablet, Rfl: 3   Review of Systems:   Review of Systems  Constitutional:  Positive for malaise/fatigue (constant day time fatigue).  HENT:         (+)snoring  Gastrointestinal:  Positive for abdominal pain (generalized) and diarrhea.  Genitourinary:  Positive for frequency.  Musculoskeletal:        (+)left leg stiffness  Neurological:  Positive for dizziness and headaches.    Vitals:   Vitals:   07/20/22 1408  BP: 100/70  Pulse: 84  Temp: 98 F (36.7 C)  TempSrc: Temporal  SpO2: 95%  Weight: 186 lb 8 oz (84.6 kg)  Height: 5\' 2"  (1.575 m)     Body mass index is 34.11 kg/m.  Physical Exam:   Physical Exam Vitals and nursing note reviewed.  Constitutional:      General: She is not in acute distress.    Appearance: She is well-developed. She is not ill-appearing or toxic-appearing.  Cardiovascular:     Rate and Rhythm: Normal rate and regular rhythm.     Pulses: Normal pulses.     Heart sounds: Normal heart sounds, S1 normal and S2 normal.  Pulmonary:     Effort: Pulmonary effort is normal.     Breath sounds: Normal breath sounds.  Skin:    General: Skin is warm and dry.  Neurological:     Mental Status: She is alert.     GCS: GCS eye subscore is 4. GCS verbal subscore is 5. GCS motor subscore is 6.  Psychiatric:        Speech: Speech normal.        Behavior: Behavior normal. Behavior is cooperative.     Assessment and Plan:   Suspected sleep apnea Referral to sleep studies placed today  Diarrhea of presumed infectious origin Will repeat testing due to symptom(s) Refused blood work today -- has severe needle phobia Discussed with her that if symptom(s) worsen to the point where she is  lightheaded or dizzy, decreased urine output, needs to go to the ER for IVF and blood work If C diff returns, will likely repeat vancomycin Gastroenterology contact info given   Enlarged ovary Printed off results of her CT scan from OSH and recommended that she follow-up with gynecology regarding this  [employer] as a  scribe for Jarold Motto, PA.,have documented all relevant documentation on the behalf of Jarold Motto, PA,as directed by  Jarold Motto, PA while in the presence of Jarold Motto, [place].  I, Jarold Motto, [place], have reviewed all documentation for this visit. The documentation on 07/20/22 for the exam, diagnosis, procedures, and orders are all accurate and complete.  Jarold Motto, PA-C

## 2022-07-21 ENCOUNTER — Other Ambulatory Visit: Payer: Medicaid Other

## 2022-07-21 DIAGNOSIS — R350 Frequency of micturition: Secondary | ICD-10-CM | POA: Diagnosis not present

## 2022-07-21 DIAGNOSIS — R197 Diarrhea, unspecified: Secondary | ICD-10-CM | POA: Diagnosis not present

## 2022-07-21 LAB — POC URINALSYSI DIPSTICK (AUTOMATED)
Bilirubin, UA: NEGATIVE
Blood, UA: NEGATIVE
Glucose, UA: NEGATIVE
Ketones, UA: NEGATIVE
Leukocytes, UA: NEGATIVE
Nitrite, UA: NEGATIVE
Protein, UA: NEGATIVE
Spec Grav, UA: >=1.03 — AB (ref 1.010–1.025)
Urobilinogen, UA: 0.2 E.U./dL
pH, UA: 5.5 (ref 5.0–8.0)

## 2022-07-24 LAB — GI PROFILE, STOOL, PCR
Adenovirus F 40/41: NOT DETECTED
Astrovirus: NOT DETECTED
C difficile toxin A/B: DETECTED — AB
Campylobacter: NOT DETECTED
Cryptosporidium: NOT DETECTED
Cyclospora cayetanensis: NOT DETECTED
Entamoeba histolytica: NOT DETECTED
Enteroaggregative E coli: NOT DETECTED
Enteropathogenic E coli: NOT DETECTED
Enterotoxigenic E coli: NOT DETECTED
Giardia lamblia: NOT DETECTED
Norovirus GI/GII: DETECTED — AB
Plesiomonas shigelloides: NOT DETECTED
Rotavirus A: NOT DETECTED
Salmonella: NOT DETECTED
Sapovirus: NOT DETECTED
Shiga-toxin-producing E coli: NOT DETECTED
Shigella/Enteroinvasive E coli: NOT DETECTED
Vibrio cholerae: NOT DETECTED
Vibrio: NOT DETECTED
Yersinia enterocolitica: NOT DETECTED

## 2022-07-24 LAB — SPECIMEN STATUS REPORT

## 2022-07-25 ENCOUNTER — Other Ambulatory Visit: Payer: Self-pay | Admitting: Physician Assistant

## 2022-07-25 ENCOUNTER — Telehealth: Payer: Self-pay | Admitting: Family Medicine

## 2022-07-25 MED ORDER — VANCOMYCIN HCL 125 MG PO CAPS: 125.0000 mg | ORAL_CAPSULE | Freq: Four times a day (QID) | ORAL | 0 refills | Status: DC

## 2022-07-25 MED ORDER — FIDAXOMICIN 200 MG PO TABS: 200.0000 mg | ORAL_TABLET | Freq: Two times a day (BID) | ORAL | 0 refills | Status: DC

## 2022-07-25 NOTE — Telephone Encounter (Signed)
Received call from Call A Nurse on patient. [facility] requests different antibiotic given Dificid $5000 cost to patient.  I gave verbal order to phone in vancomycin 125mg  QID x10 days #40.  Reviewing chart, she had initial C diff infection 06/06/2022 treated with vancomycin so this would be first recurrence.  Will leave up to PCP if she wants to do pulsed taper of vanc after initial 10d treatment dose.

## 2022-07-26 ENCOUNTER — Other Ambulatory Visit: Payer: Self-pay

## 2022-07-26 ENCOUNTER — Telehealth: Payer: Self-pay | Admitting: Physician Assistant

## 2022-07-26 ENCOUNTER — Other Ambulatory Visit (HOSPITAL_BASED_OUTPATIENT_CLINIC_OR_DEPARTMENT_OTHER): Payer: Self-pay

## 2022-07-26 LAB — OVA AND PARASITE EXAMINATION
CONCENTRATE RESULT:: NONE SEEN
MICRO NUMBER:: 15182772
SPECIMEN QUALITY:: ADEQUATE
TRICHROME RESULT:: NONE SEEN

## 2022-07-26 LAB — URINE CULTURE
MICRO NUMBER:: 15182773
Result:: NO GROWTH
SPECIMEN QUALITY:: ADEQUATE

## 2022-07-26 MED ORDER — VANCOMYCIN HCL 125 MG PO CAPS
125.0000 mg | ORAL_CAPSULE | Freq: Four times a day (QID) | ORAL | 0 refills | Status: AC
  Filled 2022-07-26: qty 40, 10d supply, fill #0

## 2022-07-26 NOTE — Telephone Encounter (Signed)
Spoke to pt asked her if she is taking the antibiotic that was called in over the weekend to [facility]? Pt said no, they closed before she could get there. Asked her if she needs me to send the medication to [facility] for her? Pt said yes. Told her okay will send now, please make sure you to start medication today. Pt verbalized understanding.

## 2022-07-26 NOTE — Telephone Encounter (Signed)
Taken care of by Eustaquio Boyden - MD   Patient Name First: [facility] Last: [name] Gender: Female DOB: 1990/03/23 Age: 32 Y 12 D Return Phone Number: [phone] (Primary) Address: City/ State/ Zip: [place]  [address] Client [facility] - Human resources officer [facility] [employer] Provider Jarold Motto- [place] Contact Type Call Who Is Calling Pharmacy Call Type Pharmacy Send to RN Chief Complaint Paging or Request for Consult Reason for Call Request to change medication order Initial Comment Caller states they received a script on a patient but it isn't covered by insurance and it is $5,000. Was wanting to change to something different for the patient. Medication is Dificid 200 mg tabs. Pharmacy Name [facility] Pharmacist Name [facility] Pharmacy Number [phone] Translation No Disp. Time [name] Time) Disposition Final User 07/25/2022 3:10:23 PM Paged On Call back to [facility], RNLurena Joiner 07/25/2022 3:47:28 PM Clinical Call Yes Tresa Endo, RN, [name] Final Disposition 07/25/2022 3:47:28 PM Clinical Call Yes Tresa Endo, RN, [name] Verbal Orders/Maintenance Medications Medication Refill Route Dosage Regime Duration Admin Instructions User Name Vancomycin Oral 125 mg 10 Days one capsule by mouth four times daily for ten days. VO Dr Eustaquio Boyden covering for [facility] PA Tresa Endo, RN, [facility] Phone DateTime Result/ Outcome Message Type Notes Eustaquio Boyden - MD [phone] 07/25/2022 3:10:23 PM Paged On Call Back to Call Center Doctor Paged Please call [name] with [facility] back at [phone] regarding a patient. Thank you Eustaquio Boyden - MD 07/25/2022 3:31:50 PM Spoke with On Call - General Message Result OCP called back with verbal order for Vancomycin 125 mg 4xdaily # 40. This RN called in to [facility] pharmacy

## 2022-07-26 NOTE — Telephone Encounter (Signed)
Advised to see provide within 24 hrs   Patient Name First: [facility] Last: Portee Gender: Female DOB: [birth year] Age: 32 Y 11 D Return Phone Number: [phone] (Primary) Address: City/ State/ Zip: [place]  [address] Client [facility] - Human resources officer [facility] [employer] Provider Jarold Motto- [place] Contact Type Call Who Is Calling Patient / Member / Family / Caregiver Call Type Triage / Clinical Relationship To Patient Self Return Phone Number [phone] (Primary) Chief Complaint Abdominal Pain Reason for Call Medication Question / Request Initial Comment (1/1) Caller has lab results back and wanted to get medications called in for C-diff/norovirus. She is having abd pain after eating but no vomiting. Translation No Nurse Assessment Nurse: Tresa Endo, RN, [name] Date/Time Lamount Cohen Time): 07/24/2022 10:17:13 AM Confirm and document reason for call. If symptomatic, describe symptoms. ---Caller states she is having diarrhea, bloating, abdominal cramping. Has a history of Cdiff. Stool test came back this morning as + antigens for Cdiff and Nero virus from stool sample taken on 7-10. Cramping still present with loose stools. Does the patient have any new or worsening symptoms? ---Yes Will a triage be completed? ---Yes Related visit to physician within the last 2 weeks? ---Yes Does the PT have any chronic conditions? (i.e. diabetes, asthma, this includes High risk factors for pregnancy, etc.) ---Yes List chronic conditions. ---ANS Is the patient pregnant or possibly pregnant? (Ask all females between the ages of 28-55) ---No Is this a behavioral health or substance abuse call? ---No Guidelines Guideline Title Affirmed Question Affirmed Notes Nurse Date/Time Lamount Cohen Time) Diarrhea Abdominal pain (Exception: Pain clears with each Tresa Endo, RN, [name] 07/24/2022 10:23:12 AM Guidelines Guideline Title Affirmed  Question Affirmed Notes Nurse Date/Time Lamount Cohen Time) passage of diarrhea stool.) Disp. Time Lamount Cohen Time) Disposition Final User 07/24/2022 10:47:27 AM See PCP within 24 Hours Yes Tresa Endo RN, [name] Final Disposition 07/24/2022 10:47:27 AM See PCP within 24 Hours Yes Tresa Endo, RN, Cecil Cobbs Disagree/Comply Comply Caller Understands Yes PreDisposition Call Doctor Care Advice Given Per Guideline SEE PCP WITHIN 24 HOURS: CLEAR FLUIDS: * Drink more fluids. * Other options: An oral rehydration solution (e.g., Pedialyte, Rehydralyte). CALL BACK IF: * You become worse CARE ADVICE given per Diarrhea (Adult) guideline. Referrals GO TO FACILITY UNDECIDED

## 2022-07-26 NOTE — Telephone Encounter (Signed)
RX was ordered but not sent in.   RX:  vancomycin (VANCOCIN) 125 MG capsule   Pharm:  [facility] Phone: [phone]  Fax: [phone]

## 2022-07-26 NOTE — Telephone Encounter (Signed)
Please see message. °

## 2022-07-29 LAB — CLOSTRIDIUM DIFFICILE BY PCR

## 2022-08-12 ENCOUNTER — Other Ambulatory Visit (HOSPITAL_COMMUNITY)
Admission: RE | Admit: 2022-08-12 | Discharge: 2022-08-12 | Disposition: A | Discharge status: Home / Self Care | Payer: Medicaid Other | Source: Ambulatory Visit | Attending: Family Medicine | Admitting: Family Medicine

## 2022-08-12 ENCOUNTER — Telehealth: Payer: Self-pay

## 2022-08-12 ENCOUNTER — Other Ambulatory Visit: Payer: Self-pay

## 2022-08-12 ENCOUNTER — Encounter: Payer: Self-pay | Admitting: Family Medicine

## 2022-08-12 ENCOUNTER — Ambulatory Visit (INDEPENDENT_AMBULATORY_CARE_PROVIDER_SITE_OTHER): Payer: Medicaid Other | Admitting: Family Medicine

## 2022-08-12 VITALS — BP 96/67 | HR 70 | Wt 183.6 lb

## 2022-08-12 DIAGNOSIS — N939 Abnormal uterine and vaginal bleeding, unspecified: Secondary | ICD-10-CM | POA: Insufficient documentation

## 2022-08-12 DIAGNOSIS — Z124 Encounter for screening for malignant neoplasm of cervix: Secondary | ICD-10-CM

## 2022-08-12 DIAGNOSIS — Z30431 Encounter for routine checking of intrauterine contraceptive device: Secondary | ICD-10-CM | POA: Diagnosis not present

## 2022-08-12 NOTE — Assessment & Plan Note (Signed)
Offered trial of doxy, but just getting over C. Diff. Offered TSH and CBC but has severe needle phobia. Offered pack of COCs and she declined this. Will check pelvic sonogram to r/o uterine pathology and decide next steps.

## 2022-08-12 NOTE — Progress Notes (Signed)
Subjective:    Patient ID: [name] is a 32 y.o. female presenting with IUD Check  on 08/12/2022  HPI: Has bleeding with orgasms since her Rutha Bouchard was changed. Bleeding lasts 7 days. No sexual intercourse x 4 years. Cycles were terrible prior to [facility] 1st one, due to terrible cycles which were heavy and bleeding and painful. Cycles were quite irregular and lasted a long time. Could last up to a month.Menarche at age 22. Had CT scan which showed enlarged ovaries. She also reports hair growth on her abdomen and chin.  Review of Systems  Constitutional:  Negative for chills and fever.  Respiratory:  Negative for shortness of breath.   Cardiovascular:  Negative for chest pain.  Gastrointestinal:  Negative for abdominal pain, nausea and vomiting.  Genitourinary:  Positive for vaginal bleeding. Negative for dysuria.  Skin:  Negative for rash.      Objective:    Wt 183 lb 9.6 oz (83.3 kg)   LMP 07/12/2022 (Exact Date)   BMI 33.58 kg/m  Physical Exam Exam conducted with a chaperone present.  Constitutional:      General: She is not in acute distress.    Appearance: She is well-developed.  HENT:     Head: Normocephalic and atraumatic.  Eyes:     General: No scleral icterus.    Pupils: Pupils are equal, round, and reactive to light.  Neck:     Thyroid: No thyromegaly.  Cardiovascular:     Rate and Rhythm: Normal rate and regular rhythm.  Pulmonary:     Effort: Pulmonary effort is normal.     Breath sounds: Normal breath sounds.  Chest:  Breasts:    Right: Normal. No mass or tenderness.     Left: Normal. No mass or tenderness.  Abdominal:     General: There is no distension.     Palpations: Abdomen is soft.     Tenderness: There is no abdominal tenderness.  Genitourinary:    General: Normal vulva.     Vagina: Normal.     Cervix: No cervical bleeding.     Uterus: Not enlarged and not tender.      Adnexa:        Right: No mass or tenderness.         Left: No mass  or tenderness.       Comments: IUD strings noted Musculoskeletal:     Cervical back: Normal range of motion and neck supple.  Skin:    General: Skin is warm and dry.  Neurological:     Mental Status: She is alert and oriented to person, place, and time.         Assessment & Plan:   Problem List Items Addressed This Visit       Unprioritized   Abnormal uterine bleeding    Offered trial of doxy, but just getting over C. Diff. Offered TSH and CBC but has severe needle phobia. Offered pack of COCs and she declined this. Will check pelvic sonogram to r/o uterine pathology and decide next steps.      Relevant Orders   US PELVIC COMPLETE WITH TRANSVAGINAL   Other Visit Diagnoses     Papanicolaou smear for cervical cancer screening    -  Primary   pap smear with STD screening today   Relevant Orders   Cytology - PAP( [place])        Return in about 2 months (around 10/12/2022).  Reva Bores, MD 08/12/2022 8:31  AM

## 2022-08-12 NOTE — Telephone Encounter (Signed)
Called and identified patient using 2 forms or ID.   I informed patient that she has been scheduled for a(n) pelvic ultrasound and the she must arrive to her appointment with a full bladder.  Appointment Date: Monday August, 5th, 2024   Time: 2:15pm  Location: [facility]  Patient verbalized understanding and had no questions or concerns.  [name]   08/12/22  9:46am

## 2022-08-16 ENCOUNTER — Ambulatory Visit (HOSPITAL_COMMUNITY): Admission: RE | Admit: 2022-08-16 | Payer: Medicaid Other | Source: Ambulatory Visit

## 2022-08-17 ENCOUNTER — Ambulatory Visit (INDEPENDENT_AMBULATORY_CARE_PROVIDER_SITE_OTHER): Payer: Medicaid Other | Admitting: Physician Assistant

## 2022-08-17 ENCOUNTER — Encounter: Payer: Self-pay | Admitting: Physician Assistant

## 2022-08-17 VITALS — BP 110/78 | HR 88 | Temp 97.3°F | Ht 62.0 in | Wt 181.0 lb

## 2022-08-17 DIAGNOSIS — R197 Diarrhea, unspecified: Secondary | ICD-10-CM

## 2022-08-17 NOTE — Patient Instructions (Signed)
It was great to see you!  Drink at least 64 oz water daily  Please talk to your mom to collect all colon cancer family history in preparation for visit with Danis tomorrow  If any worsening overnight go to the ER   Take care,  Jarold Motto PA-C

## 2022-08-17 NOTE — Progress Notes (Signed)
[name] is a 32 y.o. female here for a follow up of a pre-existing problem.  History of Present Illness:   Chief Complaint  Patient presents with   Diarrhea    Pt c/o diarrhea started again on Sunday.    HPI  Diarrhea:  Tested positive for C diff on 06/06/22 at [facility] -- she was prescribed vancomycin. She had no antibiotic(s) exposure prior to onset of symptom(s). She had a follow up appointment with her infectious disease specialist and found her symptoms had improved completely.   She then saw me for return of symptom(s) on 07/20/22 and was positive again for C Diff -- I sent in Dificid but this was not affordable for her so another round of vancomycin was sent in. She took this as prescribed.  She continues to complain of chronic diarrhea.  She's also experiencing blood in stool and abdominal cramping.   Her symptoms started back this last Sunday.  She was feeling somewhat naseous and had chills at the beginning of her symptoms.  She's unsure if it's C.diff or if she has some sort of inflammatory bowel disease.  She's had a persistent headache that lasted for 48 hours. She also has chronic/ persistent fatigue.   She's had a chronic cough that has persistent for a year now.  Has ongoing gastroesophageal reflux disease but nothing that causes daily symptom(s).   Mother with benign colon polyps Father wit pre-cancerous colon polyps Paternal grandfather with colon cancer - she thinks <20 years old but is not sure  Past Medical History:  Diagnosis Date   ADHD (attention deficit hyperactivity disorder)    Allergy    Anxiety    Depression    Environmental allergies    GERD (gastroesophageal reflux disease)    Myopia of both eyes    Obesity    Syncope and collapse      Social History   Tobacco Use   Smoking status: Never   Smokeless tobacco: Never  Vaping Use   Vaping status: Never Used  Substance Use Topics   Alcohol use: Not Currently    Comment:  Occasionaly   Drug use: No    No past surgical history on file.  Family History  Problem Relation Age of Onset   Depression Mother    Hyperlipidemia Mother    Sleep apnea Mother    Colon polyps Mother    Bipolar disorder Father    Diabetes Father    Hyperlipidemia Father    Colon polyps Father    Bipolar disorder Brother    Diabetes Maternal Grandmother    Heart disease Maternal Grandmother    Hyperlipidemia Maternal Grandmother    Hypertension Maternal Grandmother    Heart disease Maternal Grandfather    Hyperlipidemia Maternal Grandfather    Sleep apnea Paternal Grandmother    Heart disease Paternal Grandmother    Hypertension Paternal Grandmother    Cancer Paternal Grandfather        colon   Diabetes Paternal Grandfather    Heart attack Paternal Grandfather    Colon cancer Paternal Grandfather    Breast cancer Maternal Aunt 40   Bipolar disorder Paternal Aunt    Epilepsy Cousin     Allergies  Allergen Reactions   Cymbalta [Duloxetine Hcl] Other (See Comments)    Hypotension   Daytrana [Methylphenidate] Rash    Like a sunburn   Duloxetine Other (See Comments)    Fainting    Current Medications:   Current Outpatient Medications:  ALPRAZolam (XANAX) 0.5 MG tablet, Take 1 tablet (0.5 mg total) by mouth as needed for anxiety or sleep., Disp: 30 tablet, Rfl: 3   lamoTRIgine (LAMICTAL) 200 MG tablet, Take 1 tablet (200 mg total) by mouth daily., Disp: 90 tablet, Rfl: 0   levonorgestrel (KYLEENA) 19.5 MG IUD, 1 each by Intrauterine route once., Disp: , Rfl:    LORATADINE PO, Take 10 mg by mouth daily., Disp: , Rfl:    Meclizine HCl (BONINE PO), Take by mouth daily as needed. , Disp: , Rfl:    midodrine (PROAMATINE) 5 MG tablet, Take 1 tablet (5 mg total) by mouth 2 (two) times daily with a meal., Disp: 180 tablet, Rfl: 0   Multiple Vitamins-Minerals (MULTIVITAL) tablet, Take 1 tablet by mouth daily. Gummy, Disp: , Rfl:    Naproxen Sodium (ALEVE PO), Take 2 tablets  by mouth daily as needed (for pain)., Disp: , Rfl:    PARoxetine (PAXIL) 20 MG tablet, Take 1 tablet (20 mg total) by mouth daily., Disp: 90 tablet, Rfl: 0   traZODone (DESYREL) 100 MG tablet, Take 1/2-1 tablet by mouth at bedtime for insomnia, Disp: 90 tablet, Rfl: 3   Review of Systems:   Review of Systems  Constitutional:  Positive for malaise/fatigue.  Respiratory:  Positive for cough.   Gastrointestinal:  Positive for diarrhea.  Neurological:  Positive for headaches.    Vitals:   Vitals:   08/17/22 1508  BP: 110/78  Pulse: 88  Temp: (!) 97.3 F (36.3 C)  TempSrc: Temporal  SpO2: 95%  Weight: 181 lb (82.1 kg)  Height: 5\' 2"  (1.575 m)     Body mass index is 33.11 kg/m.  Physical Exam:   Physical Exam Vitals and nursing note reviewed.  Constitutional:      General: She is not in acute distress.    Appearance: She is well-developed. She is not ill-appearing or toxic-appearing.  Cardiovascular:     Rate and Rhythm: Normal rate and regular rhythm.     Pulses: Normal pulses.     Heart sounds: Normal heart sounds, S1 normal and S2 normal.  Pulmonary:     Effort: Pulmonary effort is normal.     Breath sounds: Normal breath sounds.  Abdominal:     General: Abdomen is flat. Bowel sounds are decreased.     Palpations: Abdomen is soft.     Tenderness: There is generalized abdominal tenderness.  Skin:    General: Skin is warm and dry.  Neurological:     Mental Status: She is alert.     GCS: GCS eye subscore is 4. GCS verbal subscore is 5. GCS motor subscore is 6.  Psychiatric:        Speech: Speech normal.        Behavior: Behavior normal. Behavior is cooperative.     Assessment and Plan:   Diarrhea, unspecified type Thankfully she has secured an appointment with Dr. Myrtie Neither tomorrow No evidence of acute abdomen ; vitals stable on our visit today I encouraged her to go to the ER if any worsening symptom(s) tonight She is asking for "all blood work tests" to be  done -- I discussed that gastroenterology will order appropriate testing however she is requesting additional labs - I have ordered these per her request Continue to work on adequate hydration  I,Safa M Kadhim,acting as a scribe for [employer], PA.,have documented all relevant documentation on the behalf of Jarold Motto, PA,as directed by  Jarold Motto, PA while in the presence  of Jarold Motto, [place].   I, Jarold Motto, [place], have reviewed all documentation for this visit. The documentation on 08/17/22 for the exam, diagnosis, procedures, and orders are all accurate and complete.   Jarold Motto, PA-C

## 2022-08-18 ENCOUNTER — Ambulatory Visit (INDEPENDENT_AMBULATORY_CARE_PROVIDER_SITE_OTHER): Payer: Medicaid Other | Admitting: Gastroenterology

## 2022-08-18 ENCOUNTER — Other Ambulatory Visit: Payer: Medicaid Other

## 2022-08-18 ENCOUNTER — Encounter: Payer: Self-pay | Admitting: Gastroenterology

## 2022-08-18 VITALS — BP 102/70 | HR 102 | Ht 62.0 in | Wt 182.0 lb

## 2022-08-18 DIAGNOSIS — K529 Noninfective gastroenteritis and colitis, unspecified: Secondary | ICD-10-CM | POA: Diagnosis not present

## 2022-08-18 DIAGNOSIS — A498 Other bacterial infections of unspecified site: Secondary | ICD-10-CM | POA: Diagnosis not present

## 2022-08-18 NOTE — Progress Notes (Signed)
[facility] Consult Note:  History: [name] 08/18/2022  Referring provider: Jarold Motto, PA  Reason for consult/chief complaint: Diarrhea (Hx of C.diff x 2 positive results )   Subjective  HPI: [name] was referred to [place] for evaluation of diarrhea and recent C. difficile infection treated in late May/early June and evaluated at infectious disease clinic by Dr. Thedore Mins on 06/22/2022.  Those infectious symptoms resolved on vancomycin, however the patient was describing a history of abdominal cramps with intermittent diarrhea and constipation, so the ID provider referred her to [place]. Repeat C diff toxin test 07/21/22 - PCP wanted dificid, then insurance denied and given vancomycin again.  She is accompanied by her mother.   Today, she complains of chronic diarrhea. She states that's she never had regular BMs.  For years she has had intermittent bloating and crampy abdominal pain with irregular bowel habits, sometimes diarrhea but also constipation.  She has had two episodes of C. Difficile once in May about a month ago. She was treated with vancomycin for 10 days. She was doing well for about 2 weeks but had a flare up this past Sunday. She had about 12 episodes of diarrhea on Monday. She was also experiencing sever accompanying abdominal pain and cramps. She has been taking a probiotic as she has taken several courses of antibiotics.   She reports frequently experiencing abdominal pain when having a BM.  There is been some blood mixed with bowel movements and on the toilet paper.  She states that her grandfather passed away from colon cancer and her father had several polyps, both of which concern her for the possibility of those issues causing her current problem.  Patient denies any constipation, nausea, blood in stool, black stool, vomiting, unintentional weight loss, reflux, dysphagia.  ROS:  Review of Systems  Constitutional:  Negative for appetite change and  fever.  HENT:  Negative for trouble swallowing.   Respiratory:  Negative for cough and shortness of breath.   Cardiovascular:  Negative for chest pain.  Gastrointestinal:  Positive for abdominal distention, abdominal pain and diarrhea. Negative for anal bleeding, blood in stool, constipation, nausea, rectal pain and vomiting.  Genitourinary:  Negative for dysuria.  Musculoskeletal:  Negative for back pain.  Skin:  Negative for rash.  Neurological:  Negative for weakness.  All other systems reviewed and are negative.    Past Medical History: Past Medical History:  Diagnosis Date   ADHD (attention deficit hyperactivity disorder)    Allergy    Anxiety    Depression    Environmental allergies    GERD (gastroesophageal reflux disease)    Myopia of both eyes    Obesity    Syncope and collapse      Past Surgical History: History reviewed. No pertinent surgical history.   Family History: Family History  Problem Relation Age of Onset   Depression Mother    Hyperlipidemia Mother    Sleep apnea Mother    Colon polyps Mother    Bipolar disorder Father    Diabetes Father    Hyperlipidemia Father    Colon polyps Father    Bipolar disorder Brother    Diabetes Maternal Grandmother    Heart disease Maternal Grandmother    Hyperlipidemia Maternal Grandmother    Hypertension Maternal Grandmother    Heart disease Maternal Grandfather    Hyperlipidemia Maternal Grandfather    Sleep apnea Paternal Grandmother    Heart disease Paternal Grandmother    Hypertension Paternal Grandmother  Heart attack Paternal Grandfather    Colon cancer Paternal Grandfather    Breast cancer Maternal Aunt 40   Bipolar disorder Paternal Aunt    Epilepsy Cousin    Stomach cancer Neg Hx    Esophageal cancer Neg Hx    Pancreatic cancer Neg Hx     Social History: Social History   Socioeconomic History   Marital status: Single    Spouse name: Not on file   Number of children: Not on file    Years of education: Not on file   Highest education level: Associate degree: occupational, Scientist, product/process development, or vocational program  Occupational History   Occupation: Consulting civil engineer  Tobacco Use   Smoking status: Never   Smokeless tobacco: Never  Vaping Use   Vaping status: Never Used  Substance and Sexual Activity   Alcohol use: Yes    Comment: Occasionaly   Drug use: No   Sexual activity: Not Currently    Birth control/protection: I.U.D.  Other Topics Concern   Not on file  Social History Narrative   Lives with mom   For fun: plays video games   Social Determinants of Health   Financial Resource Strain: Patient Declined (07/20/2022)   Overall Financial Resource Strain (CARDIA)    Difficulty of Paying Living Expenses: Patient declined  Food Insecurity: No Food Insecurity (08/12/2022)   Hunger Vital Sign    Worried About Running Out of Food in the Last Year: Never true    Ran Out of Food in the Last Year: Never true  Transportation Needs: No Transportation Needs (08/12/2022)   PRAPARE - Administrator, Civil Service (Medical): No    Lack of Transportation (Non-Medical): No  Physical Activity: Insufficiently Active (07/20/2022)   Exercise Vital Sign    Days of Exercise per Week: 1 day    Minutes of Exercise per Session: 30 min  Stress: Stress Concern Present (07/20/2022)   [employer] of Occupational Health - Occupational Stress Questionnaire    Feeling of Stress : Very much  Social Connections: Socially Isolated (07/20/2022)   Social Connection and Isolation Panel [NHANES]    Frequency of Communication with Friends and Family: Never    Frequency of Social Gatherings with Friends and Family: Once a week    Attends Religious Services: Never    Database administrator or Organizations: No    Attends Engineer, structural: Not on file    Marital Status: Never married    Allergies: Allergies  Allergen Reactions   Cymbalta [Duloxetine Hcl] Other (See Comments)     Hypotension   Daytrana [Methylphenidate] Rash    Like a sunburn   Duloxetine Other (See Comments)    Fainting    Outpatient Meds: Current Outpatient Medications  Medication Sig Dispense Refill   ALPRAZolam (XANAX) 0.5 MG tablet Take 1 tablet (0.5 mg total) by mouth as needed for anxiety or sleep. 30 tablet 3   lamoTRIgine (LAMICTAL) 200 MG tablet Take 1 tablet (200 mg total) by mouth daily. 90 tablet 0   levonorgestrel (KYLEENA) 19.5 MG IUD 1 each by Intrauterine route once.     LORATADINE PO Take 10 mg by mouth daily.     Meclizine HCl (BONINE PO) Take by mouth daily as needed.      midodrine (PROAMATINE) 5 MG tablet Take 1 tablet (5 mg total) by mouth 2 (two) times daily with a meal. 180 tablet 0   Multiple Vitamins-Minerals (MULTIVITAL) tablet Take 1 tablet by mouth daily. Gummy  Naproxen Sodium (ALEVE PO) Take 2 tablets by mouth daily as needed (for pain).     PARoxetine (PAXIL) 20 MG tablet Take 1 tablet (20 mg total) by mouth daily. 90 tablet 0   traZODone (DESYREL) 100 MG tablet Take 1/2-1 tablet by mouth at bedtime for insomnia 90 tablet 3   No current facility-administered medications for this visit.      ___________________________________________________________________ Objective   Exam:  BP 102/70   Pulse (!) 102   Ht 5\' 2"  (1.575 m)   Wt 182 lb (82.6 kg)   LMP 07/12/2022 (Exact Date)   BMI 33.29 kg/m  Wt Readings from Last 3 Encounters:  08/18/22 182 lb (82.6 kg)  08/17/22 181 lb (82.1 kg)  08/12/22 183 lb 9.6 oz (83.3 kg)   Mother present for entire visit. General: well-appearing-not acutely ill-appearing and she is well-hydrated. Eyes: sclera anicteric, no redness ENT: oral mucosa moist without lesions, no cervical or supraclavicular lymphadenopathy CV: RRR, no JVD, no peripheral edema Resp: clear to auscultation bilaterally, normal RR and effort noted GI: soft, mild scattered tenderness, with normal active bowel sounds without distention. No  guarding or palpable organomegaly noted. Skin; warm and dry, no rash or jaundice noted Neuro: awake, alert and oriented x 3. Normal gross motor function and fluent speech   Labs: Stool studies noted above and on file in EMR.  She reportedly had a visit to an outside [facility] about February of this year with what she believed to be a GI bug with multiple other family members were also sick.  She says labs were done the CT scan suggested an abnormality.  I then discovered a visit [place] health [place] in [place] on [place] 2 for diarrhea where she was tachycardic and had a WBC of 25K with a left shift. CT scan at admission as follows   Radiologic Studies: EXAM: CT ABDOMEN PELVIS W/O CONTRAST 03/14/2022 2:00 AM  COMPARISON: None available.  HISTORY: Nausea, vomiting, and diarrhea for one day. Abdominal cramping and lightheadedness.  PROCEDURE: Patient refused IV contrast administration. No oral contrast administered per provider request. This results in suboptimal assessment of the solid organs, vasculature, and bowel. Dose reduction techniques were utilized. DLP [id number].  FINDINGS:  ABDOMEN:  Liver: Normal contour and density. Gallbladder: No calcified gallstones. Spleen: Normal contour and density. Pancreas: No peripancreatic fat stranding. Stomach and duodenum: The stomach is distended with fluid. Normal course of the duodenum. Adrenals: No masses. Kidneys: No renal or ureteral calculi. No hydronephrosis or solid exophytic renal mass.  Bowel: Normal appendix. The colon is fluid-filled. Nondistended. This can be seen in the setting of diarrhea. Small bowel loops are mostly fluid-filled as well. Small bowel loops measure up to about 2.5 cm in diameter. No active bowel inflammation. No pneumoperitoneum or drainable fluid collection. No discrete or focal obstructing transition point identified.  Retroperitoneum: Nonaneurysmal abdominal aorta. No retroperitoneal  lymphadenopathy.  Pelvis: No bladder calculi. Intrauterine device in satisfactory position. Both ovaries are slightly enlarged. No adjacent inflammation. No significant free pelvic fluid or lymphadenopathy.  Lower chest: Visualized lung bases are clear. Bones: No destructive bone lesions.  IMPRESSION:  1. Diffusely fluid-filled appearance of the stomach, small bowel, and colon. Fluid within the colon can be seen in the setting of diarrhea. Consider underlying viral gastroenteritis. Obstruction felt to be unlikely. NG tube decompression may be of benefit as clinically warranted. No pneumoperitoneum or drainable fluid collection. Please clinically correlate.  2. Intrauterine device in satisfactory position. Both ovaries are  slightly enlarged. No adjacent inflammation. Might consider underlying etiologies such as polycystic ovarian syndrome. Further assessment with nonemergent pelvic ultrasound may be of benefit.  Findings discussed with Dr. Azell Der in the ER 03/14/2022 at 10:00 AM.   Assessment:  Chronic diarrhea  Clostridium difficile infection  Recent refractory C. difficile infection with 2 courses of vancomycin, now symptoms recurred shortly after finishing the most recent antibiotic course.  I suspect she most likely has recurrence of C. difficile.  If so, this needs the close attention of the infectious disease clinic to see if they can obtain approval for Fidaxomicin, consideration of a 6-week tapering course of vancomycin (less likely to be effective if she has had 2 failed treatments with vancomycin standard course), Zinplava and possible Vowst after clearance of the infection. Plan: -C. Difficile , PCR, GDH antigen, Toxin A/B   If these are positive, then we will start her on vancomycin and send an urgent request to infectious disease for reevaluation.  Regarding her years of chronic symptoms, they sound most likely IBS, possibly IBD.  However, we cannot pursue a colonoscopy at  present until her C. difficile is resolved.  I reassured her that the symptoms are not at all typical for colorectal cancer.  Thank you for the courtesy of this consult.  Please call me with any questions or concerns.   I,Safa M Kadhim,acting as a scribe for Charlie Pitter III, MD.,have documented all relevant documentation on the behalf of Mary Rist, MD,as directed by  Mary Rist, MD while in the presence of Mary Rist, MD.   Marvis Repress III, MD, have reviewed all documentation for this visit. The documentation on 08/18/22 for the exam, diagnosis, procedures, and orders are all accurate and complete.    CC: Referring provider noted above

## 2022-08-18 NOTE — Progress Notes (Deleted)
[facility] Consult Note:  History: [name] 08/18/2022  Referring provider: Jarold Motto, PA  Reason for consult/chief complaint: No chief complaint on file.   Subjective  HPI: [name] was referred to [place] for evaluation of diarrhea and recent C. difficile infection treated in late May/early June and evaluated at infectious disease clinic by Dr. Thedore Mins on 06/22/2022.  Those infectious symptoms resolved on vancomycin, however the patient was describing a history of abdominal cramps with intermittent diarrhea and constipation, so the ID provider referred her to [place]. Repeat C diff toxin test 07/21/22 - PCP wanted dificid, then insurance denied and given vancomycin again.  ***   ROS:  Review of Systems   Past Medical History: Past Medical History:  Diagnosis Date   ADHD (attention deficit hyperactivity disorder)    Allergy    Anxiety    Depression    Environmental allergies    GERD (gastroesophageal reflux disease)    Myopia of both eyes    Obesity    Syncope and collapse      Past Surgical History: No past surgical history on file.   Family History: Family History  Problem Relation Age of Onset   Depression Mother    Hyperlipidemia Mother    Sleep apnea Mother    Colon polyps Mother    Bipolar disorder Father    Diabetes Father    Hyperlipidemia Father    Colon polyps Father    Bipolar disorder Brother    Diabetes Maternal Grandmother    Heart disease Maternal Grandmother    Hyperlipidemia Maternal Grandmother    Hypertension Maternal Grandmother    Heart disease Maternal Grandfather    Hyperlipidemia Maternal Grandfather    Sleep apnea Paternal Grandmother    Heart disease Paternal Grandmother    Hypertension Paternal Grandmother    Cancer Paternal Grandfather        colon   Diabetes Paternal Grandfather    Heart attack Paternal Grandfather    Colon cancer Paternal Grandfather    Breast cancer Maternal Aunt 40   Bipolar  disorder Paternal Aunt    Epilepsy Cousin     Social History: Social History   Socioeconomic History   Marital status: Single    Spouse name: Not on file   Number of children: Not on file   Years of education: Not on file   Highest education level: Associate degree: occupational, Scientist, product/process development, or vocational program  Occupational History   Occupation: Consulting civil engineer  Tobacco Use   Smoking status: Never   Smokeless tobacco: Never  Vaping Use   Vaping status: Never Used  Substance and Sexual Activity   Alcohol use: Not Currently    Comment: Occasionaly   Drug use: No   Sexual activity: Not Currently    Birth control/protection: I.U.D.  Other Topics Concern   Not on file  Social History Narrative   Lives with mom   For fun: plays video games   Social Determinants of Health   Financial Resource Strain: Patient Declined (07/20/2022)   Overall Financial Resource Strain (CARDIA)    Difficulty of Paying Living Expenses: Patient declined  Food Insecurity: No Food Insecurity (08/12/2022)   Hunger Vital Sign    Worried About Running Out of Food in the Last Year: Never true    Ran Out of Food in the Last Year: Never true  Transportation Needs: No Transportation Needs (08/12/2022)   PRAPARE - Transportation    Lack of Transportation (Medical): No    Lack of  Transportation (Non-Medical): No  Physical Activity: Insufficiently Active (07/20/2022)   Exercise Vital Sign    Days of Exercise per Week: 1 day    Minutes of Exercise per Session: 30 min  Stress: Stress Concern Present (07/20/2022)   [employer] of Occupational Health - Occupational Stress Questionnaire    Feeling of Stress : Very much  Social Connections: Socially Isolated (07/20/2022)   Social Connection and Isolation Panel [NHANES]    Frequency of Communication with Friends and Family: Never    Frequency of Social Gatherings with Friends and Family: Once a week    Attends Religious Services: Never    Database administrator or  Organizations: No    Attends Engineer, structural: Not on file    Marital Status: Never married    Allergies: Allergies  Allergen Reactions   Cymbalta [Duloxetine Hcl] Other (See Comments)    Hypotension   Daytrana [Methylphenidate] Rash    Like a sunburn   Duloxetine Other (See Comments)    Fainting    Outpatient Meds: Current Outpatient Medications  Medication Sig Dispense Refill   ALPRAZolam (XANAX) 0.5 MG tablet Take 1 tablet (0.5 mg total) by mouth as needed for anxiety or sleep. 30 tablet 3   lamoTRIgine (LAMICTAL) 200 MG tablet Take 1 tablet (200 mg total) by mouth daily. 90 tablet 0   levonorgestrel (KYLEENA) 19.5 MG IUD 1 each by Intrauterine route once.     LORATADINE PO Take 10 mg by mouth daily.     Meclizine HCl (BONINE PO) Take by mouth daily as needed.      midodrine (PROAMATINE) 5 MG tablet Take 1 tablet (5 mg total) by mouth 2 (two) times daily with a meal. 180 tablet 0   Multiple Vitamins-Minerals (MULTIVITAL) tablet Take 1 tablet by mouth daily. Gummy     Naproxen Sodium (ALEVE PO) Take 2 tablets by mouth daily as needed (for pain).     PARoxetine (PAXIL) 20 MG tablet Take 1 tablet (20 mg total) by mouth daily. 90 tablet 0   traZODone (DESYREL) 100 MG tablet Take 1/2-1 tablet by mouth at bedtime for insomnia 90 tablet 3   No current facility-administered medications for this visit.      ___________________________________________________________________ Objective   Exam:  LMP 07/12/2022 (Exact Date)  Wt Readings from Last 3 Encounters:  08/17/22 181 lb (82.1 kg)  08/12/22 183 lb 9.6 oz (83.3 kg)  07/20/22 186 lb 8 oz (84.6 kg)    General: ***  Eyes: sclera anicteric, no redness ENT: oral mucosa moist without lesions, no cervical or supraclavicular lymphadenopathy CV: ***, no JVD, no peripheral edema Resp: clear to auscultation bilaterally, normal RR and effort noted GI: soft, *** tenderness, with active bowel sounds. No guarding or  palpable organomegaly noted. Skin; warm and dry, no rash or jaundice noted Neuro: awake, alert and oriented x 3. Normal gross motor function and fluent speech  Labs:  ***  Radiologic Studies:  ***  Assessment: No diagnosis found.  ***  Plan:  ***  Thank you for the courtesy of this consult.  Please call me with any questions or concerns.  Charlie Pitter III  CC: Referring provider noted above

## 2022-08-18 NOTE — Patient Instructions (Addendum)
_______________________________________________________  If your blood pressure at your visit was 140/90 or greater, please contact your primary care physician to follow up on this.  _______________________________________________________  If you are age 31 or older, your body mass index should be between 23-30. Your Body mass index is 33.29 kg/m. If this is out of the aforementioned range listed, please consider follow up with your Primary Care Provider.  If you are age 34 or younger, your body mass index should be between 19-25. Your Body mass index is 33.29 kg/m. If this is out of the aformentioned range listed, please consider follow up with your Primary Care Provider.   ________________________________________________________  The [facility] providers would like to encourage you to use [facility] to communicate with providers for non-urgent requests or questions.  Due to long hold times on the telephone, sending your provider a message by [facility] may be a faster and more efficient way to get a response.  Please allow 48 business hours for a response.  Please remember that this is for non-urgent requests.  _______________________________________________________  Your provider has requested that you go to the basement level for lab work before leaving today. Press "B" on the elevator. The lab is located at the first door on the left as you exit the elevator.  It was a pleasure to see you today!  Thank you for trusting me with your gastrointestinal care!

## 2022-08-19 ENCOUNTER — Other Ambulatory Visit: Payer: Medicaid Other

## 2022-08-20 ENCOUNTER — Other Ambulatory Visit: Payer: Self-pay

## 2022-08-20 ENCOUNTER — Other Ambulatory Visit (HOSPITAL_COMMUNITY): Payer: Self-pay

## 2022-08-20 ENCOUNTER — Other Ambulatory Visit (HOSPITAL_BASED_OUTPATIENT_CLINIC_OR_DEPARTMENT_OTHER): Payer: Self-pay

## 2022-08-20 MED ORDER — VANCOMYCIN HCL 125 MG PO CAPS
ORAL_CAPSULE | ORAL | 0 refills | Status: AC
  Filled 2022-08-20: qty 77, 28d supply, fill #0

## 2022-08-23 ENCOUNTER — Encounter: Payer: Self-pay | Admitting: Gastroenterology

## 2022-08-24 ENCOUNTER — Ambulatory Visit (HOSPITAL_COMMUNITY)
Admission: RE | Admit: 2022-08-24 | Discharge: 2022-08-24 | Disposition: A | Discharge status: Home / Self Care | Payer: Medicaid Other | Source: Ambulatory Visit | Attending: Family Medicine | Admitting: Family Medicine

## 2022-08-24 DIAGNOSIS — N939 Abnormal uterine and vaginal bleeding, unspecified: Secondary | ICD-10-CM | POA: Diagnosis not present

## 2022-08-25 ENCOUNTER — Telehealth: Payer: Self-pay | Admitting: Gastroenterology

## 2022-08-25 NOTE — Telephone Encounter (Signed)
Dr. Thedore Mins,  You recently saw this patient after her first episode of C. difficile.  She has continued to relapse, and has been unable to get fidaxomicin for cost considerations.  Studies again positive (which I will send to you as a result note) so we put her on a 6-week vancomycin taper. In addition, she needs your assistance soon for ongoing treatment.(?  Another way to obtain fidaxomicin, eventual [facility], [facility]in[facility])  Please expedite an appointment with you as best you can.  Thank you,  Amada Jupiter, [facility]

## 2022-08-30 ENCOUNTER — Other Ambulatory Visit: Payer: Self-pay | Admitting: Psychiatry

## 2022-08-30 ENCOUNTER — Other Ambulatory Visit (HOSPITAL_BASED_OUTPATIENT_CLINIC_OR_DEPARTMENT_OTHER): Payer: Self-pay

## 2022-08-30 ENCOUNTER — Other Ambulatory Visit: Payer: Self-pay | Admitting: Internal Medicine

## 2022-08-30 ENCOUNTER — Other Ambulatory Visit: Payer: Self-pay

## 2022-08-30 DIAGNOSIS — G909 Disorder of the autonomic nervous system, unspecified: Secondary | ICD-10-CM

## 2022-08-30 DIAGNOSIS — F331 Major depressive disorder, recurrent, moderate: Secondary | ICD-10-CM

## 2022-08-30 DIAGNOSIS — F411 Generalized anxiety disorder: Secondary | ICD-10-CM

## 2022-08-30 DIAGNOSIS — R55 Syncope and collapse: Secondary | ICD-10-CM

## 2022-08-30 MED ORDER — MIDODRINE HCL 5 MG PO TABS
5.0000 mg | ORAL_TABLET | Freq: Two times a day (BID) | ORAL | 2 refills | Status: DC
Start: 2022-08-30 — End: 2023-09-11
  Filled 2022-08-30: qty 180, 90d supply, fill #0
  Filled 2022-10-24: qty 60, 30d supply, fill #0
  Filled 2023-01-10: qty 60, 30d supply, fill #1
  Filled 2023-02-27: qty 60, 30d supply, fill #2
  Filled 2023-03-30 – 2023-05-16 (×2): qty 60, 30d supply, fill #3
  Filled 2023-06-14: qty 60, 30d supply, fill #4
  Filled 2023-07-19: qty 60, 30d supply, fill #5

## 2022-08-31 ENCOUNTER — Other Ambulatory Visit (HOSPITAL_BASED_OUTPATIENT_CLINIC_OR_DEPARTMENT_OTHER): Payer: Self-pay

## 2022-08-31 MED ORDER — PAROXETINE HCL 20 MG PO TABS
20.0000 mg | ORAL_TABLET | Freq: Every day | ORAL | 0 refills | Status: DC
Start: 2022-08-31 — End: 2022-10-07
  Filled 2022-08-31: qty 90, 90d supply, fill #0

## 2022-09-01 ENCOUNTER — Telehealth: Payer: Self-pay | Admitting: Psychiatry

## 2022-09-01 ENCOUNTER — Other Ambulatory Visit (HOSPITAL_BASED_OUTPATIENT_CLINIC_OR_DEPARTMENT_OTHER): Payer: Self-pay

## 2022-09-01 NOTE — Telephone Encounter (Signed)
RF was sent yesterday. If it goes to a [employer] we do not get the confirmation notation like we do with outside pharmacies.

## 2022-09-01 NOTE — Telephone Encounter (Signed)
[name] yesterdaya at 4:57 asking for refill of her paroxetine.  She has been out about 4 days now.  Appt 9/26.  It looks like a prescription was sent but it doesn't show that it went through.  Please check and resent if needed.

## 2022-09-06 ENCOUNTER — Ambulatory Visit (INDEPENDENT_AMBULATORY_CARE_PROVIDER_SITE_OTHER): Payer: Medicaid Other | Admitting: Neurology

## 2022-09-06 ENCOUNTER — Encounter: Payer: Self-pay | Admitting: Neurology

## 2022-09-06 VITALS — BP 106/69 | HR 94 | Ht 62.0 in | Wt 182.0 lb

## 2022-09-06 DIAGNOSIS — R0683 Snoring: Secondary | ICD-10-CM

## 2022-09-06 DIAGNOSIS — Z9189 Other specified personal risk factors, not elsewhere classified: Secondary | ICD-10-CM

## 2022-09-06 DIAGNOSIS — E669 Obesity, unspecified: Secondary | ICD-10-CM

## 2022-09-06 DIAGNOSIS — Z820 Family history of epilepsy and other diseases of the nervous system: Secondary | ICD-10-CM | POA: Diagnosis not present

## 2022-09-06 DIAGNOSIS — G4719 Other hypersomnia: Secondary | ICD-10-CM | POA: Diagnosis not present

## 2022-09-06 DIAGNOSIS — E66811 Obesity, class 1: Secondary | ICD-10-CM

## 2022-09-06 NOTE — Patient Instructions (Signed)

## 2022-09-06 NOTE — Progress Notes (Signed)
Subjective:    Patient ID: [name] is a 32 y.o. female.  HPI    Mary Foley, MD, PhD [facility] [address], [address] [address] [place], [place] [address]  Dear [name],  I saw your patient, [name], upon your kind request in my sleep clinic today for evaluation of her sleep disorder, in particular, concern for underlying obstructive sleep apnea.  The patient is unaccompanied today.  As you know, [name] is a 32 year old female with an underlying medical history of ADHD, anxiety, depression, allergies, reflux disease, history of syncope and TIA (for which she saw my colleague, Dr. Pearlean [name]), currently on treatment for C. difficile colitis, and obesity, who reports snoring and excessive daytime somnolence.  Her Epworth sleepiness score is 4 out of 24, fatigue severity score is 53 out of 63. I reviewed your office note from 07/20/2022.  She has felt tired during the day for years.  She currently does not work, she is a Consulting civil engineer, Chief Executive Officer.  Bedtime varies, typically between 2 and 4 AM and rise time about 12 hours later.  She is a non-smoker, drinks alcohol rarely, drinks occasional caffeine, no daily caffeine consumption.  Her mom has sleep apnea.  She lives with her mother and brother.  She has no nightly nocturia and denies recurrent morning headaches.  She does not have any pets in the household.  She does not have a TV on in her bedroom at night.    Her Past Medical History Is Significant For: Past Medical History:  Diagnosis Date   ADHD (attention deficit hyperactivity disorder)    Allergy    Anxiety    Depression    Environmental allergies    GERD (gastroesophageal reflux disease)    Myopia of both eyes    Obesity    Syncope and collapse     Her Past Surgical History Is Significant For: No past surgical history on file.  Her Family History Is Significant For: Family History  Problem Relation Age of Onset   Depression  Mother    Hyperlipidemia Mother    Sleep apnea Mother    Colon polyps Mother    Bipolar disorder Father    Diabetes Father    Hyperlipidemia Father    Colon polyps Father    Bipolar disorder Brother    Diabetes Maternal Grandmother    Heart disease Maternal Grandmother    Hyperlipidemia Maternal Grandmother    Hypertension Maternal Grandmother    Heart disease Maternal Grandfather    Hyperlipidemia Maternal Grandfather    Sleep apnea Paternal Grandmother    Heart disease Paternal Grandmother    Hypertension Paternal Grandmother    Heart attack Paternal Grandfather    Colon cancer Paternal Grandfather    Breast cancer Maternal Aunt 40   Bipolar disorder Paternal Aunt    Epilepsy Cousin    Stomach cancer Neg Hx    Esophageal cancer Neg Hx    Pancreatic cancer Neg Hx     Her Social History Is Significant For: Social History   Socioeconomic History   Marital status: Single    Spouse name: Not on file   Number of children: Not on file   Years of education: Not on file   Highest education level: Associate degree: occupational, Scientist, product/process development, or vocational program  Occupational History   Occupation: Consulting civil engineer  Tobacco Use   Smoking status: Never   Smokeless tobacco: Never  Vaping Use   Vaping status: Never Used  Substance and Sexual Activity   Alcohol use: Yes    Comment: Occasionaly   Drug use: No   Sexual activity: Not Currently    Birth control/protection: I.U.D.  Other Topics Concern   Not on file  Social History Narrative   ** Merged History Encounter **       Lives with mom For fun: plays video games   Social Determinants of Health   Financial Resource Strain: Patient Declined (07/20/2022)   Overall Financial Resource Strain (CARDIA)    Difficulty of Paying Living Expenses: Patient declined  Food Insecurity: No Food Insecurity (08/12/2022)   Hunger Vital Sign    Worried About Running Out of Food in the Last Year: Never true    Ran Out of Food in the Last Year:  Never true  Transportation Needs: No Transportation Needs (08/12/2022)   PRAPARE - Administrator, Civil Service (Medical): No    Lack of Transportation (Non-Medical): No  Physical Activity: Insufficiently Active (07/20/2022)   Exercise Vital Sign    Days of Exercise per Week: 1 day    Minutes of Exercise per Session: 30 min  Stress: Stress Concern Present (07/20/2022)   [employer] of Occupational Health - Occupational Stress Questionnaire    Feeling of Stress : Very much  Social Connections: Socially Isolated (07/20/2022)   Social Connection and Isolation Panel [NHANES]    Frequency of Communication with Friends and Family: Never    Frequency of Social Gatherings with Friends and Family: Once a week    Attends Religious Services: Never    Database administrator or Organizations: No    Attends Engineer, structural: Not on file    Marital Status: Never married    Her Allergies Are:  Allergies  Allergen Reactions   Cymbalta [Duloxetine Hcl] Other (See Comments)    Hypotension   Daytrana [Methylphenidate] Rash    Like a sunburn   Duloxetine Other (See Comments)    Fainting  :   Her Current Medications Are:  Outpatient Encounter Medications as of 09/06/2022  Medication Sig   PARoxetine (PAXIL) 20 MG tablet Take 1 tablet (20 mg total) by mouth daily.   ALPRAZolam (XANAX) 0.5 MG tablet Take 1 tablet (0.5 mg total) by mouth as needed for anxiety or sleep.   lamoTRIgine (LAMICTAL) 200 MG tablet Take 1 tablet (200 mg total) by mouth daily.   levonorgestrel (KYLEENA) 19.5 MG IUD 1 each by Intrauterine route once.   LORATADINE PO Take 10 mg by mouth daily.   Meclizine HCl (BONINE PO) Take by mouth daily as needed.    midodrine (PROAMATINE) 5 MG tablet Take 1 tablet (5 mg total) by mouth 2 (two) times daily with a meal.   Multiple Vitamins-Minerals (MULTIVITAL) tablet Take 1 tablet by mouth daily. Gummy   Naproxen Sodium (ALEVE PO) Take 2 tablets by mouth daily as  needed (for pain).   traZODone (DESYREL) 100 MG tablet Take 1/2-1 tablet by mouth at bedtime for insomnia   vancomycin (VANCOCIN) 125 MG capsule Take 1 capsule (125 mg total) by mouth 4 (four) times daily for 14 days, THEN 1 capsule (125 mg total) in the morning and at bedtime for 7 days, THEN 1 capsule (125 mg total) daily for 7 days, THEN 1 capsule (125 mg total) every other day for 28 days.   No facility-administered encounter medications on file as of 09/06/2022.  :   Review of Systems:  Out of a complete 14 point review  of systems, all are reviewed and negative with the exception of these symptoms as listed below:  Review of Systems  Objective:  Neurological Exam  Physical Exam Physical Examination:   Vitals:   09/06/22 1420  BP: 106/69  Pulse: 94    General Examination: The patient is a very pleasant 32 y.o. female in no acute distress. She appears well-developed and well groomed.   HEENT: Normocephalic, atraumatic, pupils are equal, round and reactive to light, extraocular tracking is good without limitation to gaze excursion or nystagmus noted. Hearing is grossly intact. Face is symmetric with normal facial animation. Speech is clear with no dysarthria noted. There is no hypophonia. There is no lip, neck/head, jaw or voice tremor. Neck is supple with full range of passive and active motion. There are no carotid bruits on auscultation. Oropharynx exam reveals: mild mouth dryness, adequate dental hygiene and mild airway crowding, due to somewhat wider uvula, tonsils on the smaller side, Mallampati class I.  Neck circumference 13-7/8 inches.  Moderate to significant overbite noted.  Tongue protrudes centrally and palate elevates symmetrically.  Chest: Clear to auscultation without wheezing, rhonchi or crackles noted.  Heart: S1+S2+0, regular and normal without murmurs, rubs or gallops noted.   Abdomen: Soft, non-tender and non-distended.  Extremities: There is no obvious  swelling in the distal lower extremities bilaterally.   Skin: Warm and dry without trophic changes noted.   Musculoskeletal: exam reveals no obvious joint deformities.   Neurologically:  Mental status: The patient is awake, alert and oriented in all 4 spheres. Her immediate and remote memory, attention, language skills and fund of knowledge are appropriate. There is no evidence of aphasia, agnosia, apraxia or anomia. Speech is clear with normal prosody and enunciation. Thought process is linear. Mood is normal and affect is normal.  Cranial nerves II - XII are as described above under HEENT exam.  Motor exam: Normal bulk, strength and tone is noted. There is no obvious action or resting tremor.  Fine motor skills and coordination: grossly intact.  Cerebellar testing: No dysmetria or intention tremor. There is no truncal or gait ataxia.  Sensory exam: intact to light touch in the upper and lower extremities.  Gait, station and balance: She stands easily. No veering to one side is noted. No leaning to one side is noted. Posture is age-appropriate and stance is narrow based. Gait shows normal stride length and normal pace. No problems turning are noted.   Assessment and Plan:  In summary, [name] is a very pleasant 32 y.o.-year old female with an underlying medical history of ADHD, anxiety, depression, allergies, reflux disease, history of syncope and TIA (for which she saw my colleague, Dr. Pearlean [name]), currently on treatment for C. difficile colitis, and obesity, whose history and physical exam are concerning for sleep disordered breathing, particularly obstructive sleep apnea (OSA).  While a laboratory attended sleep study is typically considered "gold standard" for evaluation of sleep disordered breathing, we mutually agreed to proceed with a home sleep test at this time, particularly because of her varying sleep schedule.   I had a long chat with the patient about my findings and the diagnosis of  sleep apnea, particularly OSA, its prognosis and treatment options. We talked about medical/conservative treatments, surgical interventions and non-pharmacological approaches for symptom control. I explained, in particular, the risks and ramifications of untreated moderate to severe OSA, especially with respect to developing cardiovascular disease down the road, including congestive heart failure (CHF), difficult to treat hypertension, cardiac  arrhythmias (particularly A-fib), neurovascular complications including TIA, stroke and dementia. Even type 2 diabetes has, in part, been linked to untreated OSA. Symptoms of untreated OSA may include (but may not be limited to) daytime sleepiness, nocturia (i.e. frequent nighttime urination), memory problems, mood irritability and suboptimally controlled or worsening mood disorder such as depression and/or anxiety, lack of energy, lack of motivation, physical discomfort, as well as recurrent headaches, especially morning or nocturnal headaches. We talked about the importance of maintaining a healthy lifestyle and striving for healthy weight. In addition, we talked about the importance of striving for and maintaining good sleep hygiene. I recommended a sleep study at this time. I outlined the differences between a laboratory attended sleep study which is considered more comprehensive and accurate over the option of a home sleep test (HST); the latter may lead to underestimation of sleep disordered breathing in some instances and does not help with diagnosing upper airway resistance syndrome and is not accurate enough to diagnose primary central sleep apnea typically. I outlined possible surgical and non-surgical treatment options of OSA, including the use of a positive airway pressure (PAP) device (i.e. CPAP, AutoPAP/APAP or BiPAP in certain circumstances), a custom-made dental device (aka oral appliance, which would require a referral to a specialist dentist or orthodontist  typically, and is generally speaking not considered for patients with full dentures or edentulous state), upper airway surgical options, such as traditional UPPP (which is not considered a first-line treatment) or the Inspire device (hypoglossal nerve stimulator, which would involve a referral for consultation with an ENT surgeon, after careful selection, following inclusion criteria - also not first-line treatment). I explained the PAP treatment option to the patient in detail, as this is generally considered first-line treatment.  The patient indicated that she would be willing to try PAP therapy, if the need arises. I explained the importance of being compliant with PAP treatment, not only for insurance purposes but primarily to improve patient's symptoms symptoms, and for the patient's long term health benefit, including to reduce Her cardiovascular risks longer-term.    We will pick up our discussion about the next steps and treatment options after testing.  We will keep her posted as to the test results by phone call and/or MyChart messaging where possible.  We will plan to follow-up in sleep clinic accordingly as well.  I answered all her questions today and the patient was in agreement.   I encouraged her to call with any interim questions, concerns, problems or updates or email [place] through MyChart.  Generally speaking, sleep test authorizations may take up to 2 weeks, sometimes less, sometimes longer, the patient is encouraged to get in touch with [place] if they do not hear back from the sleep lab staff directly within the next 2 weeks.  Thank you very much for allowing me to participate in the care of this nice patient. If I can be of any further assistance to you please do not hesitate to call me at [phone].  Sincerely,   Mary Foley, MD, PhD

## 2022-09-06 NOTE — Progress Notes (Signed)
Patient is here alone for sleep consult. She states she is always tired/exhausted no matter how much sleep she gets. She is told she snores. She has had problems with this since childhood. Her mother has sleep apnea and so do some uncles and aunts. She is currently being treated by infectious disease with Vancomycin for C-diff.

## 2022-09-07 ENCOUNTER — Other Ambulatory Visit: Payer: Self-pay

## 2022-09-07 ENCOUNTER — Ambulatory Visit (INDEPENDENT_AMBULATORY_CARE_PROVIDER_SITE_OTHER): Payer: Medicaid Other | Admitting: Internal Medicine

## 2022-09-07 ENCOUNTER — Encounter: Payer: Self-pay | Admitting: Internal Medicine

## 2022-09-07 VITALS — BP 108/77 | HR 78 | Resp 16 | Ht 62.0 in | Wt 181.0 lb

## 2022-09-07 DIAGNOSIS — A0472 Enterocolitis due to Clostridium difficile, not specified as recurrent: Secondary | ICD-10-CM

## 2022-09-07 NOTE — Progress Notes (Signed)
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   LAMOTRIGINE (LAMICTAL) 200 MG TABLET    Take 1 tablet (200 mg total) by mouth daily.   LEVONORGESTREL (KYLEENA) 19.5 MG IUD    1 each by Intrauterine route once.   LORATADINE PO    Take 10 mg by mouth daily.   MECLIZINE HCL (BONINE PO)    Take by mouth daily as needed.    MIDODRINE (PROAMATINE) 5 MG TABLET    Take 1 tablet (5 mg total) by mouth 2 (two) times daily with a meal.   MULTIPLE VITAMINS-MINERALS (MULTIVITAL) TABLET    Take 1 tablet by mouth daily. Gummy   NAPROXEN SODIUM (ALEVE PO)    Take 2 tablets by mouth daily as needed (for pain).   PAROXETINE (PAXIL) 20 MG TABLET    Take 1 tablet (20 mg total) by mouth daily.   TRAZODONE (DESYREL) 100 MG TABLET    Take 1/2-1 tablet by mouth at bedtime for insomnia   VANCOMYCIN (VANCOCIN) 125 MG CAPSULE    Take 1 capsule (125 mg total) by mouth 4 (four) times daily for 14 days, THEN 1 capsule (125 mg total) in the morning and at bedtime for 7 days, THEN 1 capsule (125 mg total) daily for 7 days, THEN 1 capsule (125 mg total) every other day for 28 days.  Modified Medications   No medications on file  Discontinued Medications   No medications on file    Subjective: 46 YF F with PMHX of ADHD, anxiety/depression, GERD, obesity referred by emergency department for C. difficile infection.She had presented to the ED on 5/29 with r bloody stools.  Noted by she had 2 distinct episodes  without nausea/vomiting, abdominal pain, fever.  Noted loose stools.  No history of recent antibiotic use/hospitalizations.  Noted that she had potato salad 5 days prior.  She had similar episode vack in March 2024 with nausea, vomiting, diarrhea, abdominal pain.  C. difficile testing returned positive for antigen and toxin.  She was prescribed vancomycin 125 mg p.o. 4 times daily x 10 days.  06/22/22: Pt has states diarrhea/ bloody stool stopped 5 days into treatment. Completed abx 2 days ago. Now has normal stools.  Denies prior episode of bloody stool.  Reports 12-13 episodes in 3 months, which only lasts once. Some times has constipation. Pt has had diarrhea/constipation 5-6 years. Pt is an Dealer.  Pt was seen by GI, initial plan was for colonoscopy but 8/7 stool testin + ag and toxin. Started on vanc with 8 week taper. Today 8/27 reports she is constipated.  Review of Systems: Review of Systems  All other systems reviewed and are negative.   Past Medical History:  Diagnosis Date   ADHD (attention deficit hyperactivity disorder)    Allergy    Anxiety    Autonomic nervous system disorder    states she has falling but not syncope due to "loss of blood pressure"  Depression    Environmental allergies    GERD (gastroesophageal reflux disease)    Myopia of both eyes    Obesity    Syncope and collapse     Social History   Tobacco Use   Smoking status: Never   Smokeless tobacco: Never  Vaping Use   Vaping status: Never Used  Substance Use Topics   Alcohol use: Yes    Comment: rarely, <1 drink per month, typically wine   Drug use: No    Family History  Problem Relation Age of Onset   Depression Mother    Hyperlipidemia Mother    Sleep apnea Mother    Colon polyps Mother    Bipolar disorder Father    Diabetes Father    Hyperlipidemia Father    Colon polyps Father    Bipolar disorder Brother    Alzheimer's disease Maternal Grandmother    Diabetes Maternal Grandmother     Heart disease Maternal Grandmother    Hyperlipidemia Maternal Grandmother    Hypertension Maternal Grandmother    Alzheimer's disease Maternal Grandfather    Heart disease Maternal Grandfather    Hyperlipidemia Maternal Grandfather    Sleep apnea Paternal Grandmother    Heart disease Paternal Grandmother    Hypertension Paternal Grandmother    Snoring Paternal Grandmother    Heart attack Paternal Grandfather    Colon cancer Paternal Grandfather    Breast cancer Maternal Aunt 40   Bipolar disorder Paternal Aunt    Epilepsy Cousin    Sleep apnea Other        aunts and uncles   Stomach cancer Neg Hx    Esophageal cancer Neg Hx    Pancreatic cancer Neg Hx     Allergies  Allergen Reactions   Cymbalta [Duloxetine Hcl] Other (See Comments)    Hypotension   Daytrana [Methylphenidate] Rash    Like a sunburn   Duloxetine Other (See Comments)    Fainting    Health Maintenance  Topic Date Due   HIV Screening  Never done   Hepatitis C Screening  Never done   DTaP/Tdap/Td (7 - Td or Tdap) 10/31/2017   INFLUENZA VACCINE  04/11/2023 (Originally 08/12/2022)   COVID-19 Vaccine (3 - Pfizer risk series) 07/20/2023 (Originally 07/25/2019)   PAP SMEAR-Modifier  08/11/2025   HPV VACCINES  Aged Out    Objective:  Vitals:   09/07/22 1513  Resp: 16  Height: 5\' 2"  (1.575 m)   Body mass index is 33.29 kg/m.  Physical Exam Constitutional:      Appearance: Normal appearance.  HENT:     Head: Normocephalic and atraumatic.     Right Ear: Tympanic membrane normal.     Left Ear: Tympanic membrane normal.     Nose: Nose normal.     Mouth/Throat:     Mouth: Mucous membranes are moist.  Eyes:     Extraocular Movements: Extraocular movements intact.     Conjunctiva/sclera: Conjunctivae normal.     Pupils: Pupils are equal, round, and reactive to light.  Cardiovascular:     Rate and Rhythm: Normal rate and regular rhythm.     Heart sounds: No murmur heard.    No friction rub. No  gallop.  Pulmonary:     Effort: Pulmonary effort is normal.     Breath sounds: Normal breath sounds.  Abdominal:     General: Abdomen is flat.     Palpations: Abdomen is soft.  Musculoskeletal:        General: Normal range  of motion.  Skin:    General: Skin is warm and dry.  Neurological:     General: No focal deficit present.     Mental Status: She is alert and oriented to person, place, and time.  Psychiatric:        Mood and Affect: Mood normal.     Lab Results No results found for: "WBC", "HGB", "HCT", "MCV", "PLT" No results found for: "CREATININE", "BUN", "NA", "K", "CL", "CO2" No results found for: "ALT", "AST", "GGT", "ALKPHOS", "BILITOT"  No results found for: "CHOL", "HDL", "LDLCALC", "LDLDIRECT", "TRIG", "CHOLHDL" No results found for: "LABRPR", "RPRTITER" No results found for: "HIV1RNAQUANT", "HIV1RNAVL", "CD4TABS"   Problem List Items Addressed This Visit   None  Assessment/Plan #Cdiff bloody stool/colitis #5-6 year history of diarrhea/constipation -06/09/22 Cdiff + toxin/Ag - Symptoms resolved on vancomycin, completed abx x 10 days(2 days ago) - Pt has not been on abx, PPI. 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  -I placed a referral to GI given over 5 year history of intermittent diarrhea and constipation with abdominal cramping. 7/11 GIP + cdiff toxin A/B and norvirus. Restarted on vanco PO which she completed I supect GIG symptom 2/2 Nororvius -Pt was seen by GI, and initially plan was for colonoscopy.  Routine stool testing showed toxin and antigen positive.  She was restarted vancomycin with the aid of a taper.  Patient states that she did have some diarrhea at time of C. difficile testing on 8/7, which resolved "right away" and is now constipated.  - I discussed the case with Dr. Myrtie Neither.  I think certainly there is an underlying GI issue that is not infectious as patient has had the cyclical episodes of diarrhea and constipation over  the years.  Given that her C. difficile testing with toxin and  antigen was positive, as such unable to do colonoscopy. Plan: - Complete vancomycin with 8-week taper.  Although, test of cure for C. difficile is not routinely done.  Patient does need a colonoscopy(per my discussion with GI, cannot be done if antigen and toxin are positive due to If patient is asymptomatic, no diarrhea, would not retest at the end 8 weeks.  Of note C. difficile testing can stay positive for few months.  Danelle Earthly, MD [facility] 09/07/2022, 3:16 PM   I have personally spent 65 minutes involved in face-to-face and non-face-to-face activities for this patient on the day of the visit. Professional time spent includes the following activities: Preparing to see the patient (review of tests), Obtaining and/or reviewing separately obtained history (admission/discharge record), Performing a medically appropriate examination and/or evaluation , Ordering medications/tests/procedures, referring and communicating with other health care professionals, Documenting clinical information in the EMR, Independently interpreting results (not separately reported), Communicating results to the patient/family/caregiver, Counseling and educating the patient/family/caregiver and Care coordination (not separately reported).

## 2022-09-16 ENCOUNTER — Other Ambulatory Visit: Payer: Self-pay | Admitting: Psychiatry

## 2022-09-16 DIAGNOSIS — F331 Major depressive disorder, recurrent, moderate: Secondary | ICD-10-CM

## 2022-09-16 MED ORDER — LAMOTRIGINE 200 MG PO TABS
200.0000 mg | ORAL_TABLET | Freq: Every day | ORAL | 0 refills | Status: DC
  Filled 2022-09-16: qty 30, 30d supply, fill #0

## 2022-09-17 ENCOUNTER — Other Ambulatory Visit (HOSPITAL_BASED_OUTPATIENT_CLINIC_OR_DEPARTMENT_OTHER): Payer: Self-pay

## 2022-09-22 ENCOUNTER — Ambulatory Visit: Payer: Medicaid Other | Admitting: Neurology

## 2022-09-22 DIAGNOSIS — R0683 Snoring: Secondary | ICD-10-CM

## 2022-09-22 DIAGNOSIS — Z820 Family history of epilepsy and other diseases of the nervous system: Secondary | ICD-10-CM

## 2022-09-22 DIAGNOSIS — G4733 Obstructive sleep apnea (adult) (pediatric): Secondary | ICD-10-CM | POA: Diagnosis not present

## 2022-09-22 DIAGNOSIS — E66811 Obesity, class 1: Secondary | ICD-10-CM

## 2022-09-22 DIAGNOSIS — G4719 Other hypersomnia: Secondary | ICD-10-CM

## 2022-09-22 DIAGNOSIS — Z9189 Other specified personal risk factors, not elsewhere classified: Secondary | ICD-10-CM

## 2022-09-22 DIAGNOSIS — E669 Obesity, unspecified: Secondary | ICD-10-CM

## 2022-09-23 NOTE — Procedures (Signed)
   [facility]  HOME SLEEP TEST (Watch PAT) REPORT  STUDY DATE: 09/22/2022  DOB: March 07, 1990  MRN: [id number]  ORDERING CLINICIAN: Huston Foley, MD, PhD   REFERRING CLINICIAN: Jarold Motto, [place]   CLINICAL INFORMATION/HISTORY: 32 year old female with an underlying medical history of ADHD, anxiety, depression, allergies, reflux disease, history of syncope and TIA (for which she saw my colleague, Dr. Pearlean Brownie), currently on treatment for C. difficile colitis, and obesity, who reports snoring and excessive daytime somnolence.    Epworth sleepiness score: 4/24.  BMI: 33.7 kg/m  FINDINGS:   Sleep Summary:   Total Recording Time (hours, min): 11 hours, 42 min  Total Sleep Time (hours, min):  10 hours, 42 min  Percent REM (%):    26.6%   Respiratory Indices:   Calculated pAHI (per hour):  20.1/hour         REM pAHI:    42.7/hour       NREM pAHI: 13/hour  Central pAHI: 0/hour  Oxygen Saturation Statistics:    Oxygen Saturation (%) Mean: 94%   Minimum oxygen saturation (%):                 85%   O2 Saturation Range (%): 85 -97%    O2 Saturation (minutes) <=88%: 0.9 min  Pulse Rate Statistics:   Pulse Mean (bpm):    68/min    Pulse Range (49 - 102/min)   IMPRESSION: OSA (obstructive sleep apnea), moderate  RECOMMENDATION:  This home sleep test demonstrates moderate obstructive sleep apnea with a total AHI of 20.1/hour and O2 nadir of 85%.  Mild to moderate snoring was detected, at times louder. Treatment with a positive airway pressure (PAP) device is recommended. The patient will be advised to proceed with an autoPAP titration/trial at home for now. A full night titration study may be considered to optimize treatment settings, monitor proper oxygen saturations and aid with improvement of tolerance and adherence, if needed down the road. Alternative treatment options may include a dental device through dentistry or orthodontics in selected patients or  Inspire (hypoglossal nerve stimulator) in carefully selected patients (meeting inclusion criteria).  Concomitant weight loss is recommended (where clinically appropriate). Please note that untreated obstructive sleep apnea may carry additional perioperative morbidity. Patients with significant obstructive sleep apnea should receive perioperative PAP therapy and the surgeons and particularly the anesthesiologist should be informed of the diagnosis and the severity of the sleep disordered breathing. The patient should be cautioned not to drive, work at heights, or operate dangerous or heavy equipment when tired or sleepy. Review and reiteration of good sleep hygiene measures should be pursued with any patient. Other causes of the patient's symptoms, including circadian rhythm disturbances, an underlying mood disorder, medication effect and/or an underlying medical problem cannot be ruled out based on this test. Clinical correlation is recommended.  The patient and her referring provider will be notified of the test results. The patient will be seen in follow up in sleep clinic at [facility].  I certify that I have reviewed the raw data recording prior to the issuance of this report in accordance with the standards of the American Academy of Sleep Medicine (AASM).  INTERPRETING PHYSICIAN:   Huston Foley, MD, PhD Medical Director, [facility]) Diplomat, ABPN (Neurology and Sleep)   [facility] [address], [address] [place], [place] [address] [phone]

## 2022-09-23 NOTE — Addendum Note (Signed)
Addended by: Huston Foley on: 09/23/2022 05:56 PM   Modules accepted: Orders

## 2022-09-23 NOTE — Progress Notes (Signed)
See procedure note.

## 2022-09-28 ENCOUNTER — Telehealth: Payer: Self-pay | Admitting: *Deleted

## 2022-09-28 NOTE — Telephone Encounter (Signed)
Spoke with patient and discussed her sleep study results.  The patient verbalized understanding that her sleep study showed obstructive sleep apnea in the moderate range.  Patient is amenable to proceeding with AutoPap therapy.  We discussed the insurance compliance requirements which includes using the machine for at least 4 hours at night and also being seen by our office between 30 and 90 days after set up.  The patient scheduled an initial follow-up for 12/01/2022 at 2:15 PM via MyChart visit with Maralyn Sago NP.  Her questions were answered. Referral sent to [facility] (pt did not have DME preference).

## 2022-09-28 NOTE — Telephone Encounter (Signed)
-----   Message from Huston Foley sent at 09/23/2022  5:56 PM EDT ----- Patient referred by PCP, seen by me on 09/06/2022, patient had a HST on 09/22/2022.    Please call and notify the patient that the recent home sleep test showed obstructive sleep apnea in the moderate range. I recommend treatment in the form of autoPAP, which means, that we don't have to bring her in for a sleep study with CPAP, but will let her start using a so called autoPAP machine at home, which is a CPAP-like machine with self-adjusting pressures. We will send the order to a local DME company (of her choice, or as per insurance requirement). The DME representative will fit her with a mask, educate her on how to use the machine, how to put the mask on, etc. I have placed an order in the chart. Please send the order, talk to patient, send report to referring MD. We will need a FU in sleep clinic for 10 weeks post-PAP set up, please arrange that with me or one of our NPs. Also reinforce the need for compliance with treatment. Thanks,   Huston Foley, MD, PhD [facility])

## 2022-09-29 NOTE — Telephone Encounter (Signed)
Zott, Linnell Fulling, Otilio Jefferson, RN; [place], [place] Got It Thank  You

## 2022-10-01 ENCOUNTER — Ambulatory Visit: Payer: Medicaid Other | Admitting: Gastroenterology

## 2022-10-07 ENCOUNTER — Telehealth (INDEPENDENT_AMBULATORY_CARE_PROVIDER_SITE_OTHER): Payer: Medicaid Other | Admitting: Psychiatry

## 2022-10-07 ENCOUNTER — Other Ambulatory Visit (HOSPITAL_BASED_OUTPATIENT_CLINIC_OR_DEPARTMENT_OTHER): Payer: Self-pay

## 2022-10-07 ENCOUNTER — Encounter: Payer: Self-pay | Admitting: Psychiatry

## 2022-10-07 DIAGNOSIS — F331 Major depressive disorder, recurrent, moderate: Secondary | ICD-10-CM

## 2022-10-07 DIAGNOSIS — F411 Generalized anxiety disorder: Secondary | ICD-10-CM

## 2022-10-07 MED ORDER — LAMOTRIGINE 200 MG PO TABS
200.0000 mg | ORAL_TABLET | Freq: Every day | ORAL | 1 refills | Status: DC
  Filled 2022-10-07 – 2022-10-24 (×2): qty 90, 90d supply, fill #0
  Filled 2023-01-10: qty 90, 90d supply, fill #1

## 2022-10-07 MED ORDER — ALPRAZOLAM 0.5 MG PO TABS
0.5000 mg | ORAL_TABLET | ORAL | 4 refills | Status: DC | PRN
  Filled 2022-10-07: qty 30, 30d supply, fill #0

## 2022-10-07 MED ORDER — PAROXETINE HCL 20 MG PO TABS
20.0000 mg | ORAL_TABLET | Freq: Every day | ORAL | 1 refills | Status: DC
Start: 2022-10-07 — End: 2023-04-26
  Filled 2022-10-07 – 2022-11-21 (×2): qty 90, 90d supply, fill #0
  Filled 2023-02-27: qty 90, 90d supply, fill #1

## 2022-10-08 ENCOUNTER — Other Ambulatory Visit (HOSPITAL_BASED_OUTPATIENT_CLINIC_OR_DEPARTMENT_OTHER): Payer: Self-pay

## 2022-10-13 ENCOUNTER — Encounter: Payer: Self-pay | Admitting: Internal Medicine

## 2022-10-15 ENCOUNTER — Telehealth: Payer: Self-pay | Admitting: Internal Medicine

## 2022-10-15 ENCOUNTER — Encounter: Payer: Self-pay | Admitting: Gastroenterology

## 2022-10-15 NOTE — Telephone Encounter (Signed)
In regards to my chart message: I had communicated with GI in regards to repeat c diff testing prior to colonscopy at the end of van taper. I can reach back out.

## 2022-10-21 DIAGNOSIS — G4733 Obstructive sleep apnea (adult) (pediatric): Secondary | ICD-10-CM | POA: Diagnosis not present

## 2022-10-25 ENCOUNTER — Other Ambulatory Visit (HOSPITAL_BASED_OUTPATIENT_CLINIC_OR_DEPARTMENT_OTHER): Payer: Self-pay

## 2022-10-27 ENCOUNTER — Other Ambulatory Visit (HOSPITAL_BASED_OUTPATIENT_CLINIC_OR_DEPARTMENT_OTHER): Payer: Self-pay

## 2022-11-12 DIAGNOSIS — G4733 Obstructive sleep apnea (adult) (pediatric): Secondary | ICD-10-CM | POA: Diagnosis not present

## 2022-11-16 NOTE — Progress Notes (Shared)
[name] is a 32 y.o. female here for a follow-up on an existing problem.  History of Present Illness:   No chief complaint on file.   HPI  Chronic Fatigue  Followed by Dr. Huston Foley at [facility] for sleep disorder. 09/22/22 home sleep study showed moderate obstructive sleep apnea, O2 nadir of 85%, mild-moderate snoring. Now on CPAP.   Past Medical History:  Diagnosis Date   ADHD (attention deficit hyperactivity disorder)    Allergy    Anxiety    Autonomic nervous system disorder    states she has falling but not syncope due to "loss of blood pressure"   Depression    Environmental allergies    GERD (gastroesophageal reflux disease)    Myopia of both eyes    Obesity    Syncope and collapse      Social History   Tobacco Use   Smoking status: Never   Smokeless tobacco: Never  Vaping Use   Vaping status: Never Used  Substance Use Topics   Alcohol use: Yes    Comment: rarely, <1 drink per month, typically wine   Drug use: No    No past surgical history on file.  Family History  Problem Relation Age of Onset   Depression Mother    Hyperlipidemia Mother    Sleep apnea Mother    Colon polyps Mother    Bipolar disorder Father    Diabetes Father    Hyperlipidemia Father    Colon polyps Father    Bipolar disorder Brother    Alzheimer's disease Maternal Grandmother    Diabetes Maternal Grandmother    Heart disease Maternal Grandmother    Hyperlipidemia Maternal Grandmother    Hypertension Maternal Grandmother    Alzheimer's disease Maternal Grandfather    Heart disease Maternal Grandfather    Hyperlipidemia Maternal Grandfather    Sleep apnea Paternal Grandmother    Heart disease Paternal Grandmother    Hypertension Paternal Grandmother    Snoring Paternal Grandmother    Heart attack Paternal Grandfather    Colon cancer Paternal Grandfather    Breast cancer Maternal Aunt 40   Bipolar disorder Paternal Aunt    Epilepsy Cousin     Sleep apnea Other        aunts and uncles   Stomach cancer Neg Hx    Esophageal cancer Neg Hx    Pancreatic cancer Neg Hx     Allergies  Allergen Reactions   Cymbalta [Duloxetine Hcl] Other (See Comments)    Hypotension   Daytrana [Methylphenidate] Rash    Like a sunburn   Duloxetine Other (See Comments)    Fainting    Current Medications:   Current Outpatient Medications:    ALPRAZolam (XANAX) 0.5 MG tablet, Take 1 tablet (0.5 mg total) by mouth as needed for anxiety or sleep., Disp: 30 tablet, Rfl: 4   lamoTRIgine (LAMICTAL) 200 MG tablet, Take 1 tablet (200 mg total) by mouth daily., Disp: 90 tablet, Rfl: 1   levonorgestrel (KYLEENA) 19.5 MG IUD, 1 each by Intrauterine route once., Disp: , Rfl:    LORATADINE PO, Take 10 mg by mouth daily., Disp: , Rfl:    Meclizine HCl (BONINE PO), Take by mouth daily as needed. , Disp: , Rfl:    midodrine (PROAMATINE) 5 MG tablet, Take 1 tablet (5 mg total) by mouth 2 (two) times daily with a meal., Disp: 180 tablet, Rfl: 2   Multiple Vitamins-Minerals (MULTIVITAL) tablet, Take 1 tablet by mouth daily. Gummy,  Disp: , Rfl:    Naproxen Sodium (ALEVE PO), Take 2 tablets by mouth daily as needed (for pain)., Disp: , Rfl:    PARoxetine (PAXIL) 20 MG tablet, Take 1 tablet (20 mg total) by mouth daily., Disp: 90 tablet, Rfl: 1   traZODone (DESYREL) 100 MG tablet, Take 1/2-1 tablet by mouth at bedtime for insomnia, Disp: 90 tablet, Rfl: 3   Review of Systems:   ROS  Vitals:   There were no vitals filed for this visit.   There is no height or weight on file to calculate BMI.  Physical Exam:   Physical Exam  Assessment and Plan:   ***   I,Alexander Ruley,acting as a scribe for Jarold Motto, PA.,have documented all relevant documentation on the behalf of Jarold Motto, PA,as directed by  Jarold Motto, PA while in the presence of Jarold Motto, [place].   ***   Jarold Motto, PA-C

## 2022-11-17 ENCOUNTER — Ambulatory Visit: Payer: Medicaid Other | Admitting: Physician Assistant

## 2022-11-18 NOTE — Progress Notes (Signed)
[name] is a 32 y.o. female here for a follow-up of a pre-existing problem.  History of Present Illness:   Chief Complaint  Patient presents with   Fatigue    Pt c/o fatigue for the past week.   HPI - here w/ mother  Fatigue: Complains of fatigue for the past week. Reports experiencing abnormal fatigue, distinguishable from her daytime somnolence.  On 9/11 sh was dx with moderate OSA and mild-moderate snoring following a home sleep test; fhx of sleep apnea. She received an AutoPap and has a follow-up appointment 11/20 with neurology to discuss AutoPap effectiveness. Reports she is using her machine nightly, but hasn't noticed much difference in her daytime somnolence States she has an about 5 events/hour according to her app.  Describes instance of severe fatigue on Tuesday Notes her sleep schedule has changed and her activity level has decreased for the past 2-3 weeks.  Endorses acute stress recently and constipation. States she would like to have blood work done today.   She has severe needle phobia and has put off blood work until today. Denies bright red blood/black & tarry stools, sick contact, unusual cough, recent travel, pregnancy concern (abstinent since 2019), or nocturnal hidrosis  Past Medical History:  Diagnosis Date   ADHD (attention deficit hyperactivity disorder)    Allergy    Anxiety    Autonomic nervous system disorder    states she has falling but not syncope due to "loss of blood pressure"   Depression    Environmental allergies    GERD (gastroesophageal reflux disease)    Myopia of both eyes    Obesity    Syncope and collapse     Social History   Tobacco Use   Smoking status: Never   Smokeless tobacco: Never  Vaping Use   Vaping status: Never Used  Substance Use Topics   Alcohol use: Yes    Comment: rarely, <1 drink per month, typically wine   Drug use: No   No past surgical history on file. Family History  Problem Relation Age of Onset    Depression Mother    Hyperlipidemia Mother    Sleep apnea Mother    Colon polyps Mother    Bipolar disorder Father    Diabetes Father    Hyperlipidemia Father    Colon polyps Father    Bipolar disorder Brother    Alzheimer's disease Maternal Grandmother    Diabetes Maternal Grandmother    Heart disease Maternal Grandmother    Hyperlipidemia Maternal Grandmother    Hypertension Maternal Grandmother    Alzheimer's disease Maternal Grandfather    Heart disease Maternal Grandfather    Hyperlipidemia Maternal Grandfather    Sleep apnea Paternal Grandmother    Heart disease Paternal Grandmother    Hypertension Paternal Grandmother    Snoring Paternal Grandmother    Heart attack Paternal Grandfather    Colon cancer Paternal Grandfather    Breast cancer Maternal Aunt 40   Bipolar disorder Paternal Aunt    Epilepsy Cousin    Sleep apnea Other        aunts and uncles   Stomach cancer Neg Hx    Esophageal cancer Neg Hx    Pancreatic cancer Neg Hx    Allergies  Allergen Reactions   Cymbalta [Duloxetine Hcl] Other (See Comments)    Hypotension   Daytrana [Methylphenidate] Rash    Like a sunburn   Duloxetine Other (See Comments)    Fainting   Current Medications:   Current  Outpatient Medications:    ALPRAZolam (XANAX) 0.5 MG tablet, Take 1 tablet (0.5 mg total) by mouth as needed for anxiety or sleep., Disp: 30 tablet, Rfl: 4   lamoTRIgine (LAMICTAL) 200 MG tablet, Take 1 tablet (200 mg total) by mouth daily., Disp: 90 tablet, Rfl: 1   levonorgestrel (KYLEENA) 19.5 MG IUD, 1 each by Intrauterine route once., Disp: , Rfl:    LORATADINE PO, Take 10 mg by mouth daily., Disp: , Rfl:    Meclizine HCl (BONINE PO), Take by mouth daily as needed. , Disp: , Rfl:    midodrine (PROAMATINE) 5 MG tablet, Take 1 tablet (5 mg total) by mouth 2 (two) times daily with a meal., Disp: 180 tablet, Rfl: 2   Multiple Vitamins-Minerals (MULTIVITAL) tablet, Take 1 tablet by mouth daily. Gummy, Disp: ,  Rfl:    Naproxen Sodium (ALEVE PO), Take 2 tablets by mouth daily as needed (for pain)., Disp: , Rfl:    PARoxetine (PAXIL) 20 MG tablet, Take 1 tablet (20 mg total) by mouth daily., Disp: 90 tablet, Rfl: 1   traZODone (DESYREL) 100 MG tablet, Take 1/2-1 tablet by mouth at bedtime for insomnia, Disp: 90 tablet, Rfl: 3  Review of Systems:   ROS See pertinent positives and negatives as per the HPI.  Vitals:   Vitals:   11/19/22 0812  BP: 110/70  Pulse: 90  Temp: (!) 97.5 F (36.4 C)  TempSrc: Temporal  SpO2: 97%  Weight: 176 lb 8 oz (80.1 kg)  Height: 5\' 2"  (1.575 m)     Body mass index is 32.28 kg/m.  Physical Exam:   Physical Exam Vitals and nursing note reviewed.  Constitutional:      General: She is not in acute distress.    Appearance: She is well-developed. She is not ill-appearing or toxic-appearing.  Cardiovascular:     Rate and Rhythm: Normal rate and regular rhythm.     Pulses: Normal pulses.     Heart sounds: Normal heart sounds, S1 normal and S2 normal.  Pulmonary:     Effort: Pulmonary effort is normal.     Breath sounds: Normal breath sounds.  Skin:    General: Skin is warm and dry.  Neurological:     Mental Status: She is alert.     GCS: GCS eye subscore is 4. GCS verbal subscore is 5. GCS motor subscore is 6.  Psychiatric:        Speech: Speech normal.        Behavior: Behavior normal. Behavior is cooperative.     Assessment and Plan:   Fatigue, unspecified type Update blood work today to further evaluate Continue current efforts at healthy lifestyle Next steps based on results  Leukocytosis, unspecified type This was an incidental finding at ER -- will update blood work today and add smear Low threshold for hematology if persistently abnormal  Vitamin D deficiency Update vitamin D  Screening examination for STD (sexually transmitted disease) Update sexual transmitted infection screening  Encounter for screening for other viral  diseases Update hepatitis c  Encounter for lipid screening for cardiovascular disease Update lipid panel  Diarrhea, unspecified type This has resolved but we will further evaluate blood work from prior office visit as she has severe needle phobia and wants comprehensive panel today  I,Emily Lagle,acting as a scribe for [employer], PA.,have documented all relevant documentation on the behalf of Jarold Motto, PA,as directed by  Jarold Motto, PA while in the presence of Jarold Motto, [place].  I,  Jarold Motto, [place], have reviewed all documentation for this visit. The documentation on 11/19/22 for the exam, diagnosis, procedures, and orders are all accurate and complete.  Jarold Motto, PA-C

## 2022-11-19 ENCOUNTER — Ambulatory Visit (INDEPENDENT_AMBULATORY_CARE_PROVIDER_SITE_OTHER): Payer: Medicaid Other | Admitting: Physician Assistant

## 2022-11-19 ENCOUNTER — Encounter: Payer: Self-pay | Admitting: Physician Assistant

## 2022-11-19 VITALS — BP 110/70 | HR 90 | Temp 97.5°F | Ht 62.0 in | Wt 176.5 lb

## 2022-11-19 DIAGNOSIS — Z13220 Encounter for screening for lipoid disorders: Secondary | ICD-10-CM | POA: Diagnosis not present

## 2022-11-19 DIAGNOSIS — D72829 Elevated white blood cell count, unspecified: Secondary | ICD-10-CM

## 2022-11-19 DIAGNOSIS — Z113 Encounter for screening for infections with a predominantly sexual mode of transmission: Secondary | ICD-10-CM

## 2022-11-19 DIAGNOSIS — Z1159 Encounter for screening for other viral diseases: Secondary | ICD-10-CM

## 2022-11-19 DIAGNOSIS — Z136 Encounter for screening for cardiovascular disorders: Secondary | ICD-10-CM | POA: Diagnosis not present

## 2022-11-19 DIAGNOSIS — R5383 Other fatigue: Secondary | ICD-10-CM | POA: Diagnosis not present

## 2022-11-19 DIAGNOSIS — R197 Diarrhea, unspecified: Secondary | ICD-10-CM

## 2022-11-19 DIAGNOSIS — E559 Vitamin D deficiency, unspecified: Secondary | ICD-10-CM

## 2022-11-19 LAB — COMPREHENSIVE METABOLIC PANEL
ALT: 12 U/L (ref 0–35)
AST: 12 U/L (ref 0–37)
Albumin: 4.5 g/dL (ref 3.5–5.2)
Alkaline Phosphatase: 95 U/L (ref 39–117)
BUN: 9 mg/dL (ref 6–23)
CO2: 29 meq/L (ref 19–32)
Calcium: 9.4 mg/dL (ref 8.4–10.5)
Chloride: 104 meq/L (ref 96–112)
Creatinine, Ser: 0.68 mg/dL (ref 0.40–1.20)
GFR: 116.11 mL/min (ref >60.00)
Glucose, Bld: 111 mg/dL — ABNORMAL HIGH (ref 70–99)
Potassium: 4.1 meq/L (ref 3.5–5.1)
Sodium: 141 meq/L (ref 135–145)
Total Bilirubin: 0.3 mg/dL (ref 0.2–1.2)
Total Protein: 7.1 g/dL (ref 6.0–8.3)

## 2022-11-19 LAB — CBC WITH DIFFERENTIAL/PLATELET
Basophils Absolute: 0 10*3/uL (ref 0.0–0.1)
Basophils Relative: 0.3 % (ref 0.0–3.0)
Eosinophils Absolute: 0.1 10*3/uL (ref 0.0–0.7)
Eosinophils Relative: 1 % (ref 0.0–5.0)
HCT: 38.8 % (ref 36.0–46.0)
Hemoglobin: 12.9 g/dL (ref 12.0–15.0)
Lymphocytes Relative: 37.9 % (ref 12.0–46.0)
Lymphs Abs: 3.3 10*3/uL (ref 0.7–4.0)
MCHC: 33.2 g/dL (ref 30.0–36.0)
MCV: 92.6 fL (ref 78.0–100.0)
Monocytes Absolute: 0.5 10*3/uL (ref 0.1–1.0)
Monocytes Relative: 5.9 % (ref 3.0–12.0)
Neutro Abs: 4.9 10*3/uL (ref 1.4–7.7)
Neutrophils Relative %: 54.9 % (ref 43.0–77.0)
Platelets: 359 10*3/uL (ref 150.0–400.0)
RBC: 4.19 Mil/uL (ref 3.87–5.11)
RDW: 13.3 % (ref 11.5–15.5)
WBC: 8.8 10*3/uL (ref 4.0–10.5)

## 2022-11-19 LAB — IBC + FERRITIN
Ferritin: 24.1 ng/mL (ref 10.0–291.0)
Iron: 45 ug/dL (ref 42–145)
Saturation Ratios: 13.7 % — ABNORMAL LOW (ref 20.0–50.0)
TIBC: 329 ug/dL (ref 250.0–450.0)
Transferrin: 235 mg/dL (ref 212.0–360.0)

## 2022-11-19 LAB — VITAMIN D 25 HYDROXY (VIT D DEFICIENCY, FRACTURES): VITD: 21.87 ng/mL — ABNORMAL LOW (ref 30.00–100.00)

## 2022-11-19 LAB — T4, FREE: Free T4: 0.74 ng/dL (ref 0.60–1.60)

## 2022-11-19 LAB — LIPID PANEL
Cholesterol: 177 mg/dL (ref 0–200)
HDL: 52.8 mg/dL (ref >39.00)
LDL Cholesterol: 106 mg/dL — ABNORMAL HIGH (ref 0–99)
NonHDL: 124.2
Total CHOL/HDL Ratio: 3
Triglycerides: 90 mg/dL (ref 0.0–149.0)
VLDL: 18 mg/dL (ref 0.0–40.0)

## 2022-11-19 LAB — VITAMIN B12: Vitamin B-12: 212 pg/mL (ref 211–911)

## 2022-11-19 LAB — T3, FREE: T3, Free: 3.3 pg/mL (ref 2.3–4.2)

## 2022-11-19 LAB — TSH: TSH: 2.24 u[IU]/mL (ref 0.35–5.50)

## 2022-11-19 LAB — LIPASE: Lipase: 16 U/L (ref 11.0–59.0)

## 2022-11-19 LAB — HEMOGLOBIN A1C: Hgb A1c MFr Bld: 5.7 % (ref 4.6–6.5)

## 2022-11-20 ENCOUNTER — Other Ambulatory Visit (HOSPITAL_BASED_OUTPATIENT_CLINIC_OR_DEPARTMENT_OTHER): Payer: Self-pay

## 2022-11-21 ENCOUNTER — Other Ambulatory Visit (HOSPITAL_BASED_OUTPATIENT_CLINIC_OR_DEPARTMENT_OTHER): Payer: Self-pay

## 2022-11-22 ENCOUNTER — Other Ambulatory Visit: Payer: Self-pay

## 2022-11-22 ENCOUNTER — Ambulatory Visit (INDEPENDENT_AMBULATORY_CARE_PROVIDER_SITE_OTHER): Payer: Medicaid Other | Admitting: Internal Medicine

## 2022-11-22 ENCOUNTER — Encounter: Payer: Self-pay | Admitting: Internal Medicine

## 2022-11-22 ENCOUNTER — Other Ambulatory Visit (HOSPITAL_BASED_OUTPATIENT_CLINIC_OR_DEPARTMENT_OTHER): Payer: Self-pay

## 2022-11-22 ENCOUNTER — Other Ambulatory Visit: Payer: Self-pay | Admitting: Physician Assistant

## 2022-11-22 VITALS — BP 115/78 | HR 88 | Temp 97.8°F | Ht 62.0 in | Wt 180.0 lb

## 2022-11-22 DIAGNOSIS — A0472 Enterocolitis due to Clostridium difficile, not specified as recurrent: Secondary | ICD-10-CM

## 2022-11-22 LAB — PATHOLOGIST SMEAR REVIEW

## 2022-11-22 LAB — RPR: RPR Ser Ql: NONREACTIVE

## 2022-11-22 LAB — HIV ANTIBODY (ROUTINE TESTING W REFLEX): HIV 1&2 Ab, 4th Generation: NONREACTIVE

## 2022-11-22 LAB — HEPATITIS C ANTIBODY: Hepatitis C Ab: NONREACTIVE

## 2022-11-22 MED ORDER — VITAMIN D (ERGOCALCIFEROL) 1.25 MG (50000 UNIT) PO CAPS
50000.0000 [IU] | ORAL_CAPSULE | ORAL | 0 refills | Status: DC
  Filled 2022-11-22: qty 12, 84d supply, fill #0

## 2022-11-22 NOTE — Progress Notes (Signed)
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 difficile infection.She had presented to the ED on 5/29 with r bloody stools.  Noted by she had 2 distinct episodes without nausea/vomiting, abdominal pain, fever.  Noted loose stools.  No history of recent antibiotic use/hospitalizations.  Noted that she had potato salad 5 days prior.  She had similar episode vack in March 2024 with nausea, vomiting, diarrhea, abdominal pain.  C. difficile testing returned positive for antigen and toxin.  She was prescribed vancomycin 125 mg p.o. 4 times daily x 10 days.  06/22/22: Pt has states diarrhea/ bloody stool stopped 5 days into treatment. Completed abx 2 days ago. Now has normal stools.  Denies prior episode of bloody stool.  Reports 12-13 episodes in 3 months, which only lasts once. Some times has constipation. Pt has had diarrhea/constipation 5-6 years. Pt is an Dealer.  Pt was seen by GI, initial plan was for colonoscopy but 8/7 stool testin + ag and toxin. Started on vanc with 8 week taper. \  8/27 reports she is constipated.  TODAY 11/11: Pt reports she is constipated x 1months. Completed  vanc about a month ago. Did not taek a prolonged taped Review of Systems  All other systems reviewed and are negative.   Past Medical History:  Diagnosis Date   ADHD (attention deficit hyperactivity disorder)    Allergy    Anxiety    Autonomic nervous system disorder    states she has falling but not  syncope due to "loss of blood pressure"   Depression    Environmental allergies    GERD (gastroesophageal reflux disease)    Myopia of both eyes    Obesity    Syncope and collapse     Outpatient Medications Prior to Visit  Medication Sig Dispense Refill   ALPRAZolam (XANAX) 0.5 MG tablet Take 1 tablet (0.5 mg total) by mouth as needed for anxiety or sleep. 30 tablet 4   lamoTRIgine (LAMICTAL) 200 MG tablet Take 1 tablet (200 mg total) by mouth daily. 90 tablet 1   levonorgestrel (KYLEENA) 19.5 MG IUD 1 each by Intrauterine route once.     LORATADINE PO Take 10 mg by mouth daily.     Meclizine HCl (BONINE PO) Take by mouth daily as needed.      midodrine (PROAMATINE) 5 MG tablet Take 1 tablet (5 mg total) by mouth 2 (two) times daily with a meal. 180 tablet 2   Multiple Vitamins-Minerals (MULTIVITAL) tablet Take 1 tablet by mouth daily. Gummy     Naproxen Sodium (ALEVE PO) Take 2 tablets by mouth daily as needed (for pain).     PARoxetine (PAXIL) 20 MG tablet Take 1 tablet (20 mg total) by mouth daily. 90 tablet 1   traZODone (DESYREL) 100 MG tablet Take 1/2-1 tablet by mouth at bedtime for insomnia 90 tablet 3   Vitamin D, Ergocalciferol, (DRISDOL) 1.25 MG (50000 UNIT) CAPS capsule Take 1 capsule (50,000 Units total) by mouth every 7 (seven)  days. 12 capsule 0   No facility-administered medications prior to visit.     Allergies  Allergen Reactions   Cymbalta [Duloxetine Hcl] Other (See Comments)    Hypotension   Daytrana [Methylphenidate] Rash    Like a sunburn   Duloxetine Other (See Comments)    Fainting    Social History   Tobacco Use   Smoking status: Never   Smokeless tobacco: Never  Vaping Use   Vaping status: Never Used  Substance Use Topics   Alcohol use: Yes    Comment: rarely, <1 drink per month, typically wine   Drug use: No    Family History  Problem Relation Age of Onset   Depression Mother    Hyperlipidemia Mother    Sleep apnea Mother    Colon  polyps Mother    Bipolar disorder Father    Diabetes Father    Hyperlipidemia Father    Colon polyps Father    Bipolar disorder Brother    Alzheimer's disease Maternal Grandmother    Diabetes Maternal Grandmother    Heart disease Maternal Grandmother    Hyperlipidemia Maternal Grandmother    Hypertension Maternal Grandmother    Alzheimer's disease Maternal Grandfather    Heart disease Maternal Grandfather    Hyperlipidemia Maternal Grandfather    Sleep apnea Paternal Grandmother    Heart disease Paternal Grandmother    Hypertension Paternal Grandmother    Snoring Paternal Grandmother    Heart attack Paternal Grandfather    Colon cancer Paternal Grandfather    Breast cancer Maternal Aunt 40   Bipolar disorder Paternal Aunt    Epilepsy Cousin    Sleep apnea Other        aunts and uncles   Stomach cancer Neg Hx    Esophageal cancer Neg Hx    Pancreatic cancer Neg Hx     Objective:  There were no vitals filed for this visit. There is no height or weight on file to calculate BMI.  Physical Exam Constitutional:      Appearance: Normal appearance.  HENT:     Head: Normocephalic and atraumatic.     Right Ear: Tympanic membrane normal.     Left Ear: Tympanic membrane normal.     Nose: Nose normal.     Mouth/Throat:     Mouth: Mucous membranes are moist.  Eyes:     Extraocular Movements: Extraocular movements intact.     Conjunctiva/sclera: Conjunctivae normal.     Pupils: Pupils are equal, round, and reactive to light.  Cardiovascular:     Rate and Rhythm: Normal rate and regular rhythm.     Heart sounds: No murmur heard.    No friction rub. No gallop.  Pulmonary:     Effort: Pulmonary effort is normal.     Breath sounds: Normal breath sounds.  Abdominal:     General: Abdomen is flat.     Palpations: Abdomen is soft.  Musculoskeletal:        General: Normal range of motion.  Skin:    General: Skin is warm and dry.  Neurological:     General: No focal deficit  present.     Mental Status: She is alert and oriented to person, place, and time.  Psychiatric:        Mood and Affect: Mood normal.     Lab Results: Lab Results  Component Value Date   WBC 8.8 11/19/2022   HGB 12.9 11/19/2022   HCT 38.8 11/19/2022   MCV 92.6 11/19/2022  PLT 359.0 11/19/2022    Lab Results  Component Value Date   CREATININE 0.68 11/19/2022   BUN 9 11/19/2022   NA 141 11/19/2022   K 4.1 11/19/2022   CL 104 11/19/2022   CO2 29 11/19/2022    Lab Results  Component Value Date   ALT 12 11/19/2022   AST 12 11/19/2022   ALKPHOS 95 11/19/2022   BILITOT 0.3 11/19/2022     Assessment & Plan:  #Cdiff diarrhea-resolved -Patient started vancomycin about a month ago.  She states that she was taking it every other day.  She notes that she has been constipated for about a month as well.  She has a bowel movement every 2 to 3 days.  She is inquiring about her ability to have a colonoscopy. - Notified Dr. Elmer Sow epic chat) that okay to have colonoscopy from the ID perspective.  As patient is constipated for about a month no plans for CAD testing as she is not having diarrhea. - She does have worsening alopecia and fatigue.  That may be part of some sort of inflammatory syndrome. - Follow-up with ID as needed  Danelle Earthly, MD [facility]   11/22/22  3:41 PM I have personally spent 35 minutes involved in face-to-face and non-face-to-face activities for this patient on the day of the visit. Professional time spent includes the following activities: Preparing to see the patient (review of tests), Obtaining and/or reviewing separately obtained history (admission/discharge record), Performing a medically appropriate examination and/or evaluation , Ordering medications/tests/procedures, referring and communicating with other health care professionals, Documenting clinical information in the EMR, Independently  interpreting results (not separately reported), Communicating results to the patient/family/caregiver, Counseling and educating the patient/family/caregiver and Care coordination (not separately reported).

## 2022-11-24 ENCOUNTER — Encounter: Payer: Self-pay | Admitting: Psychiatry

## 2022-11-26 ENCOUNTER — Telehealth: Payer: Self-pay | Admitting: Gastroenterology

## 2022-11-26 NOTE — Telephone Encounter (Signed)
Inbound call from patient, states she would like to proceed with her colonoscopy. Patient states she got a "clean bill of health" from her Infectious Disease doctor.

## 2022-11-29 ENCOUNTER — Other Ambulatory Visit: Payer: Self-pay

## 2022-11-29 NOTE — Telephone Encounter (Signed)
Thank you for the update.   I did a chart review since the last visit here in August, and Dr Thedore Mins from ID believes this patient's C difficile has cleared.  She is reportedly experiencing constipation.  There are underlying GI symptoms requiring further evaluation with colonoscopy.  I have a clinic opening tomorrow at 8:20 (8 AM arrival).  This is the only opening I have for the next couple of months. Please contact her today and arrange tomorrow's visit.  - H. Danis

## 2022-11-29 NOTE — Telephone Encounter (Signed)
Pt stated that she got a clean bill of Health from her Infections Disease Doctor and requesting to proceed with her colonoscopy. Please review and advise.

## 2022-11-29 NOTE — Telephone Encounter (Signed)
Pt made aware of Dr. Myrtie Neither recommendations: Pt was scheduled to see Dr. Myrtie Neither 11/30/2022 at 8:20 AM. Pt to arrive at 8:00 AM.  Pt verbalized understanding with all questions answered.

## 2022-11-29 NOTE — Telephone Encounter (Signed)
Left message for pt to call back  °

## 2022-11-30 ENCOUNTER — Ambulatory Visit: Payer: Medicaid Other | Admitting: Gastroenterology

## 2022-11-30 ENCOUNTER — Other Ambulatory Visit (HOSPITAL_BASED_OUTPATIENT_CLINIC_OR_DEPARTMENT_OTHER): Payer: Self-pay

## 2022-11-30 ENCOUNTER — Encounter: Payer: Self-pay | Admitting: Gastroenterology

## 2022-11-30 VITALS — BP 90/60 | HR 80 | Ht 61.5 in | Wt 177.4 lb

## 2022-11-30 DIAGNOSIS — R1030 Lower abdominal pain, unspecified: Secondary | ICD-10-CM | POA: Diagnosis not present

## 2022-11-30 DIAGNOSIS — K5909 Other constipation: Secondary | ICD-10-CM

## 2022-11-30 DIAGNOSIS — A498 Other bacterial infections of unspecified site: Secondary | ICD-10-CM

## 2022-11-30 MED ORDER — METOCLOPRAMIDE HCL 5 MG PO TABS
ORAL_TABLET | ORAL | 0 refills | Status: DC
  Filled 2022-11-30 – 2022-12-23 (×2): qty 2, 1d supply, fill #0

## 2022-11-30 MED ORDER — NA SULFATE-K SULFATE-MG SULF 17.5-3.13-1.6 GM/177ML PO SOLN
1.0000 | Freq: Once | ORAL | 0 refills | Status: AC
  Filled 2022-11-30 – 2022-12-22 (×2): qty 354, 1d supply, fill #0

## 2022-11-30 NOTE — Patient Instructions (Signed)
Start Miralax 1 capful daily in 8 ounces of liquid.  You have been scheduled for a colonoscopy. Please follow written instructions given to you at your visit today.   Please pick up your prep supplies at the pharmacy within the next 1-3 days.  If you use inhalers (even only as needed), please bring them with you on the day of your procedure.  DO NOT TAKE 7 DAYS PRIOR TO TEST- Trulicity (dulaglutide) Ozempic, Wegovy (semaglutide) Mounjaro (tirzepatide) Bydureon Bcise (exanatide extended release)  DO NOT TAKE 1 DAY PRIOR TO YOUR TEST Rybelsus (semaglutide) Adlyxin (lixisenatide) Victoza (liraglutide) Byetta (exanatide) ___________________________________________________________________________  _______________________________________________________  If your blood pressure at your visit was 140/90 or greater, please contact your primary care physician to follow up on this.  _______________________________________________________  If you are age 70 or older, your body mass index should be between 23-30. Your Body mass index is 32.97 kg/m. If this is out of the aforementioned range listed, please consider follow up with your Primary Care Provider.  If you are age 31 or younger, your body mass index should be between 19-25. Your Body mass index is 32.97 kg/m. If this is out of the aformentioned range listed, please consider follow up with your Primary Care Provider.   ________________________________________________________  The [facility] providers would like to encourage you to use [facility] to communicate with providers for non-urgent requests or questions.  Due to long hold times on the telephone, sending your provider a message by [facility] may be a faster and more efficient way to get a response.  Please allow 48 business hours for a response.  Please remember that this is for non-urgent requests.  _______________________________________________________ It was a pleasure to see you  today!  Thank you for trusting me with your gastrointestinal care!

## 2022-11-30 NOTE — Progress Notes (Signed)
[facility] Progress Note  Chief Complaint: Abdominal pain and altered bowel habits  Subjective  Prior history  [name] was seen here on 08/18/2022 for consultation regarding chronic diarrhea and intermittent constipation with chronic abdominal pain.  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  Multiple communications with Dr. Thedore Mins of the ID clinic and clarification of stool tests done at the office visit with [place] led to agreement that there was ongoing active C. difficile infection.  She was treated with a 6-week tapering course of vancomycin and had follow-up with Dr. Thedore Mins most recently on 11/22/2022.  C. difficile was felt to have been successfully treated and cleared, no further stool testing was apparently warranted since patient was reporting constipation.  She was then referred back to see [place] for consideration of colonoscopy that had been discussed for years of underlying GI symptoms described in my initial consult note. _______________  She continues to have constipation with a BM about every other day and some straining.  Denies rectal bleeding.  No chronic upper digestive symptoms.  Bandlike lower abdominal pressure and bloating.  She remains concerned about a family history of colon cancer in a paternal grandfather and her father's history of precancerous polyps.  She is not lately taking any medicines to relieve constipation, but thinks she may try some MiraLAX that had been recommended at 1 point.  She is also been generally fatigued and has some recent lab work with primary care.  This noted B12 level at the low end of normal range and she was started on supplements.  Vitamin D D also low with supplements started.  She was also told her iron levels were low and she was put on an oral supplement with in the last couple of weeks.     ([name] politely declined the use of [employer] AI software for this  visit) ROS: Cardiovascular:  no chest pain Respiratory: no dyspnea  The patient's Past Medical, Family and Social History were reviewed and are on file in the EMR. Past Medical History:  Diagnosis Date   ADHD (attention deficit hyperactivity disorder)    Allergy    Anxiety    Autonomic nervous system disorder    states she has falling but not syncope due to "loss of blood pressure"   Depression    Environmental allergies    GERD (gastroesophageal reflux disease)    Myopia of both eyes    Obesity    Syncope and collapse    Vitamin B 12 deficiency    Vitamin D deficiency     History reviewed. No pertinent surgical history.   Objective:  Med list reviewed  Current Outpatient Medications:    ALPRAZolam (XANAX) 0.5 MG tablet, Take 1 tablet (0.5 mg total) by mouth as needed for anxiety or sleep., Disp: 30 tablet, Rfl: 4   ascorbic acid (VITAMIN C) 500 MG tablet, Take 500 mg by mouth every other day., Disp: , Rfl:    Cyanocobalamin (VITAMIN B-12 PO), Take 1 tablet by mouth daily., Disp: , Rfl:    ferrous sulfate 325 (65 FE) MG EC tablet, Take 325 mg by mouth every other day., Disp: , Rfl:    lamoTRIgine (LAMICTAL) 200 MG tablet, Take 1 tablet (200 mg total) by mouth daily., Disp: 90 tablet, Rfl: 1   levonorgestrel (KYLEENA) 19.5 MG IUD, 1 each by Intrauterine route once., Disp: , Rfl:    LORATADINE PO, Take 10 mg by  mouth daily., Disp: , Rfl:    Meclizine HCl (BONINE PO), Take by mouth daily as needed. , Disp: , Rfl:    metoCLOPramide (REGLAN) 5 MG tablet, Use per colonoscopy instructions., Disp: 2 tablet, Rfl: 0   midodrine (PROAMATINE) 5 MG tablet, Take 1 tablet (5 mg total) by mouth 2 (two) times daily with a meal., Disp: 180 tablet, Rfl: 2   Multiple Vitamins-Minerals (MULTIVITAL) tablet, Take 1 tablet by mouth daily. Gummy, Disp: , Rfl:    Na Sulfate-K Sulfate-Mg Sulf 17.5-3.13-1.6 GM/177ML SOLN, Take 1 kit by mouth once for 1 dose., Disp: 354 mL, Rfl: 0   Naproxen Sodium  (ALEVE PO), Take 2 tablets by mouth daily as needed (for pain)., Disp: , Rfl:    PARoxetine (PAXIL) 20 MG tablet, Take 1 tablet (20 mg total) by mouth daily., Disp: 90 tablet, Rfl: 1   traZODone (DESYREL) 100 MG tablet, Take 1/2-1 tablet by mouth at bedtime for insomnia, Disp: 90 tablet, Rfl: 3   Vitamin D, Ergocalciferol, (DRISDOL) 1.25 MG (50000 UNIT) CAPS capsule, Take 1 capsule (50,000 Units total) by mouth every 7 (seven) days., Disp: 12 capsule, Rfl: 0   Vital signs in last 24 hrs: Vitals:   11/30/22 0816  BP: 90/60  Pulse: 80   Wt Readings from Last 3 Encounters:  11/30/22 177 lb 6 oz (80.5 kg)  11/22/22 180 lb (81.6 kg)  11/19/22 176 lb 8 oz (80.1 kg)    Physical Exam   HEENT: sclera anicteric, oral mucosa moist without lesions Neck: supple, no thyromegaly, JVD or lymphadenopathy Cardiac: Regular without appreciable murmur,  no peripheral edema Pulm: clear to auscultation bilaterally, normal RR and effort noted Abdomen: soft, no tenderness, with active bowel sounds. No guarding or palpable hepatosplenomegaly. Skin; warm and dry, no jaundice or rash   Labs:  Stool testing results from August 2024 on file. ___________________________________________ Radiologic studies:   ____________________________________________ Other:     Latest Ref Rng & Units 11/19/2022    9:28 AM  CBC  WBC 4.0 - 10.5 K/uL 8.8   Hemoglobin 12.0 - 15.0 g/dL 16.1   Hematocrit 09.6 - 46.0 % 38.8   Platelets 150.0 - 400.0 K/uL 359.0    Iron/TIBC/Ferritin/ %Sat    Component Value Date/Time   IRON 45 11/19/2022 0928   TIBC 329.0 11/19/2022 0928   FERRITIN 24.1 11/19/2022 0928   IRONPCTSAT 13.7 (L) 11/19/2022 0928    _____________________________________________   Encounter Diagnoses  Name Primary?   Chronic constipation Yes   Clostridium difficile infection    Lower abdominal pain     Assessment and Plan Years of chronic constipation with abdominal pain and bloating, most likely  motility/IBS.  Could be an element of dyssynergic defecation/pelvic floor dysfunction.  Less likely neoplasia or obstruction given age and no prior surgical history.  Recommend starting half capful of MiraLAX daily, increasing dose and frequency as needed.  Colonoscopy warranted for further evaluation.  Procedure described along with risks and benefits and she was agreeable.  The benefits and risks of the planned procedure were described in detail with the patient or (when appropriate) their health care proxy.  Risks were outlined as including, but not limited to, bleeding, infection, perforation, adverse medication reaction leading to cardiac or pulmonary decompensation, pancreatitis (if ERCP).  The limitation of incomplete mucosal visualization was also discussed.  No guarantees or warranties were given.   It is not clear to me she needs iron supplementation based on the mildly decreased iron saturation with normal  iron, ferritin and hemoglobin.  I am concerned the iron supplement will worsen her constipation.  Will forward my note to primary care for further consideration.  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   32 minutes were spent on this encounter (including chart review, history/exam, counseling/coordination of care, and documentation) > 50% of that time was spent on counseling and coordination of care.   Charlie Pitter III

## 2022-11-30 NOTE — Progress Notes (Unsigned)
   Virtual Visit via Video Note  I connected with [name] on 11/30/22 at  2:15 PM EST by a video enabled telemedicine application and verified that I am speaking with the correct person using two identifiers.  Location: Patient: at her home Provider: in the office    I discussed the limitations of evaluation and management by telemedicine and the availability of in person appointments. The patient expressed understanding and agreed to proceed.  History of Present Illness: Today December 01, 2022 SS: Saw Dr. Frances Furbish in August 2024 reporting snoring and excessive daytime somnolence.  Had HST 09/22/2022 showing moderate OSA total AHI 20.1/hour and O2 nadir of 85%.  Recommended starting CPAP. Started using FFM, it was leaking, got stretched out. Now using nasal mask Air 20, will try tonight. Has not noted much improvement since starting CPAP. Setup date was 10/21/22. Found out several Vitamin D deficiency recently.GI issues, just got all clear for C.Diff. Having colonoscopy coming up.  CPAP data is attached, 93% usage, greater than 4 hours 70%.  6 to 12 cm water.  Leak 9.3.  AHI 5.4.  09/06/22 Dr. Frances Furbish: I saw your patient, [name], upon your kind request in my sleep clinic today for evaluation of her sleep disorder, in particular, concern for underlying obstructive sleep apnea.  The patient is unaccompanied today.  As you know, [name] is a 32 year old female with an underlying medical history of ADHD, anxiety, depression, allergies, reflux disease, history of syncope and TIA (for which she saw my colleague, Dr. Pearlean Brownie), currently on treatment for C. difficile colitis, and obesity, who reports snoring and excessive daytime somnolence.  Her Epworth sleepiness score is 4 out of 24, fatigue severity score is 53 out of 63. I reviewed your office note from 07/20/2022.  She has felt tired during the day for years.  She currently does not work, she is a Consulting civil engineer, Chief Executive Officer.  Bedtime  varies, typically between 2 and 4 AM and rise time about 12 hours later.  She is a non-smoker, drinks alcohol rarely, drinks occasional caffeine, no daily caffeine consumption.  Her mom has sleep apnea.  She lives with her mother and brother.  She has no nightly nocturia and denies recurrent morning headaches.  She does not have any pets in the household.  She does not have a TV on in her bedroom at night.     Observations/Objective: Via video visit, is alert and oriented, is lying in her bed, moves about freely  Assessment and Plan: 1.  OSA on CPAP  -Encouraged to use CPAP nightly for minimum of 4 hours.  She will be trying a new CPAP mask tonight.  Her AHI is 5.4 (central 4.3).  Maxing pressure out at 10.7.  I will pull her CPAP data in 1 month to reevaluate the AHI.  Avoid supine.   Follow Up Instructions: 6 months  VV   I discussed the assessment and treatment plan with the patient. The patient was provided an opportunity to ask questions and all were answered. The patient agreed with the plan and demonstrated an understanding of the instructions.   The patient was advised to call back or seek an in-person evaluation if the symptoms worsen or if the condition fails to improve as anticipated.   Otila Kluver, DNP  [facility] [address], [address] [place], [place] [address] [phone]

## 2022-12-01 ENCOUNTER — Telehealth: Payer: Medicaid Other | Admitting: Neurology

## 2022-12-01 DIAGNOSIS — G4733 Obstructive sleep apnea (adult) (pediatric): Secondary | ICD-10-CM

## 2022-12-01 NOTE — Patient Instructions (Signed)
Recommend nightly usage of CPAP for minimum of 4 hours.  Try her new CPAP mask tonight.  Try to avoid supine sleep.  I will pull your CPAP data in 1 month and reach out via MyChart.  Please let me know if you need anything before that.  Thanks!!

## 2022-12-12 DIAGNOSIS — G4733 Obstructive sleep apnea (adult) (pediatric): Secondary | ICD-10-CM | POA: Diagnosis not present

## 2022-12-13 ENCOUNTER — Other Ambulatory Visit (HOSPITAL_BASED_OUTPATIENT_CLINIC_OR_DEPARTMENT_OTHER): Payer: Self-pay

## 2022-12-22 ENCOUNTER — Other Ambulatory Visit (HOSPITAL_BASED_OUTPATIENT_CLINIC_OR_DEPARTMENT_OTHER): Payer: Self-pay

## 2022-12-22 DIAGNOSIS — G4733 Obstructive sleep apnea (adult) (pediatric): Secondary | ICD-10-CM | POA: Diagnosis not present

## 2022-12-23 ENCOUNTER — Other Ambulatory Visit: Payer: Self-pay

## 2022-12-23 ENCOUNTER — Other Ambulatory Visit (HOSPITAL_BASED_OUTPATIENT_CLINIC_OR_DEPARTMENT_OTHER): Payer: Self-pay

## 2022-12-24 ENCOUNTER — Other Ambulatory Visit (HOSPITAL_BASED_OUTPATIENT_CLINIC_OR_DEPARTMENT_OTHER): Payer: Self-pay

## 2022-12-27 ENCOUNTER — Ambulatory Visit: Payer: Medicaid Other | Admitting: Gastroenterology

## 2022-12-27 ENCOUNTER — Encounter: Payer: Self-pay | Admitting: Gastroenterology

## 2022-12-27 VITALS — BP 120/78 | HR 75 | Temp 98.3°F | Resp 12 | Ht 61.0 in | Wt 177.0 lb

## 2022-12-27 DIAGNOSIS — K5909 Other constipation: Secondary | ICD-10-CM | POA: Diagnosis not present

## 2022-12-27 DIAGNOSIS — K529 Noninfective gastroenteritis and colitis, unspecified: Secondary | ICD-10-CM | POA: Diagnosis not present

## 2022-12-27 DIAGNOSIS — R1030 Lower abdominal pain, unspecified: Secondary | ICD-10-CM

## 2022-12-27 MED ORDER — SODIUM CHLORIDE 0.9 % IV SOLN: 500.0000 mL | Freq: Once | INTRAVENOUS | Status: DC

## 2022-12-27 NOTE — Op Note (Signed)
[facility] Patient Name: [name] Procedure Date: 12/27/2022 3:55 PM MRN: [id number] Endoscopist: Sherilyn Cooter L. Myrtie Neither , MD, [id number] Age: 32 Referring MD:  Date of Birth: 06-Dec-1990 Gender: Female Account #: [id number] Procedure:                Colonoscopy Indications:              Lower abdominal pain, Chronic diarrhea, Constipation                           Clinical details and recent office note(s)                           Patient also had recurrent C. difficile earlier                            this year that finally cleared after treatment and                            evaluation by the infectious disease clinic Medicines:                Monitored Anesthesia Care Procedure:                Pre-Anesthesia Assessment:                           - Prior to the procedure, a History and Physical                            was performed, and patient medications and                            allergies were reviewed. The patient's tolerance of                            previous anesthesia was also reviewed. The risks                            and benefits of the procedure and the sedation                            options and risks were discussed with the patient.                            All questions were answered, and informed consent                            was obtained. Prior Anticoagulants: The patient has                            taken no anticoagulant or antiplatelet agents. ASA                            Grade Assessment: III - A patient with severe  systemic disease. After reviewing the risks and                            benefits, the patient was deemed in satisfactory                            condition to undergo the procedure.                           After obtaining informed consent, the colonoscope                            was passed under direct vision. Throughout the                            procedure, the  patient's blood pressure, pulse, and                            oxygen saturations were monitored continuously. The                            CF [id number] #[id number] was introduced through the anus                            and advanced to the the terminal ileum, with                            identification of the appendiceal orifice and IC                            valve. The colonoscopy was performed without                            difficulty. The patient tolerated the procedure                            well. The quality of the bowel preparation was                            excellent. The terminal ileum, ileocecal valve,                            appendiceal orifice, and rectum were photographed. Scope In: 4:15:34 PM Scope Out: 4:25:48 PM Scope Withdrawal Time: 0 hours 6 minutes 47 seconds  Total Procedure Duration: 0 hours 10 minutes 14 seconds  Findings:                 The perianal and digital rectal examinations were                            normal.                           The terminal ileum appeared normal.  Normal mucosa was found in the entire colon.                            Biopsies for histology were taken with a cold                            forceps from the right colon and left colon for                            evaluation of microscopic colitis.                           The entire examined colon appeared normal on direct                            and retroflexion views. Complications:            No immediate complications. Estimated Blood Loss:     Estimated blood loss was minimal. Impression:               - The examined portion of the ileum was normal.                           - Normal mucosa in the entire examined colon.                            Biopsied.                           - The entire examined colon is normal on direct and                            retroflexion views.                           Overall symptom  complex most consistent with IBS -                            primarily constipation since the resolution of C                            diff noted above.                           Possible element of dyssynergic defecation. Recommendation:           - Patient has a contact number available for                            emergencies. The signs and symptoms of potential                            delayed complications were discussed with the                            patient. Return to normal activities  tomorrow.                            Written discharge instructions were provided to the                            patient.                           - Resume previous diet.                           - Continue present medications.                           - Await pathology results.                           - No recommendation at this time regarding repeat                            colonoscopy due to young age.                           - Consider trying the miralax recommended and                            discussed at the office visits.                           -return to our office as needed. Glendal Cassaday L. Myrtie Neither, MD 12/27/2022 4:32:18 PM This report has been signed electronically.

## 2022-12-27 NOTE — Progress Notes (Signed)
Called to room to assist during endoscopic procedure.  Patient ID and intended procedure confirmed with present staff. Received instructions for my participation in the procedure from the performing physician.  

## 2022-12-27 NOTE — Patient Instructions (Signed)
   Continue previous diet & medications  Await results of biopsies done today  Consider trying Miralax recommended and discussed at the office visits  Return to GI office as needed    YOU HAD AN ENDOSCOPIC PROCEDURE TODAY AT THE [facility]:   Refer to the procedure report that was given to you for any specific questions about what was found during the examination.  If the procedure report does not answer your questions, please call your gastroenterologist to clarify.  If you requested that your care partner not be given the details of your procedure findings, then the procedure report has been included in a sealed envelope for you to review at your convenience later.  YOU SHOULD EXPECT: Some feelings of bloating in the abdomen. Passage of more gas than usual.  Walking can help get rid of the air that was put into your GI tract during the procedure and reduce the bloating. If you had a lower endoscopy (such as a colonoscopy or flexible sigmoidoscopy) you may notice spotting of blood in your stool or on the toilet paper. If you underwent a bowel prep for your procedure, you may not have a normal bowel movement for a few days.  Please Note:  You might notice some irritation and congestion in your nose or some drainage.  This is from the oxygen used during your procedure.  There is no need for concern and it should clear up in a day or so.  SYMPTOMS TO REPORT IMMEDIATELY:  Following lower endoscopy (colonoscopy or flexible sigmoidoscopy):  Excessive amounts of blood in the stool  Significant tenderness or worsening of abdominal pains  Swelling of the abdomen that is new, acute  Fever of 100F or higher  For urgent or emergent issues, a gastroenterologist can be reached at any hour by calling [phone]. Do not use MyChart messaging for urgent concerns.    DIET:  We do recommend a small meal at first, but then you may proceed to your regular diet.  Drink plenty of fluids but  you should avoid alcoholic beverages for 24 hours.  ACTIVITY:  You should plan to take it easy for the rest of today and you should NOT DRIVE or use heavy machinery until tomorrow (because of the sedation medicines used during the test).    FOLLOW UP: Our staff will call the number listed on your records the next business day following your procedure.  We will call around 7:15- 8:00 am to check on you and address any questions or concerns that you may have regarding the information given to you following your procedure. If we do not reach you, we will leave a message.     If any biopsies were taken you will be contacted by phone or by letter within the next 1-3 weeks.  Please call us at [phone] if you have not heard about the biopsies in 3 weeks.    SIGNATURES/CONFIDENTIALITY: You and/or your care partner have signed paperwork which will be entered into your electronic medical record.  These signatures attest to the fact that that the information above on your After Visit Summary has been reviewed and is understood.  Full responsibility of the confidentiality of this discharge information lies with you and/or your care-partner.

## 2022-12-27 NOTE — Progress Notes (Signed)
Pt's states no medical or surgical changes since previsit or office visit. 

## 2022-12-27 NOTE — Progress Notes (Signed)
Sedate, gd SR, tolerated procedure well, VSS, report to RN 

## 2022-12-27 NOTE — Progress Notes (Signed)
No significant changes to clinical history since GI office visit on 11/30/22.  The patient is appropriate for an endoscopic procedure in the ambulatory setting.  - Amada Jupiter, MD

## 2022-12-28 ENCOUNTER — Telehealth: Payer: Self-pay | Admitting: *Deleted

## 2022-12-28 NOTE — Telephone Encounter (Signed)
  Follow up Call-     12/27/2022    3:46 PM  Call back number  Post procedure Call Back phone  # [phone]  Permission to leave phone message Yes     Patient questions:  Do you have a fever, pain , or abdominal swelling? No. Pain Score  0 *  Have you tolerated food without any problems? Yes.    Have you been able to return to your normal activities? Yes.    Do you have any questions about your discharge instructions: Diet   No. Medications  No. Follow up visit  No.  Do you have questions or concerns about your Care? No.  Actions: * If pain score is 4 or above: No action needed, pain <4.

## 2022-12-29 ENCOUNTER — Encounter: Payer: Self-pay | Admitting: Neurology

## 2022-12-30 ENCOUNTER — Encounter: Payer: Self-pay | Admitting: Gastroenterology

## 2022-12-30 LAB — SURGICAL PATHOLOGY

## 2023-01-03 NOTE — Progress Notes (Unsigned)
GYNECOLOGY OFFICE VISIT NOTE  History:  [name] is a 32 y.o. G0 here for IUD removal and reinsertion. She has a [place] and she is getting her period. May be because in the lower uterine segment. She previously has therapeutic amenorrhea.   Prior to [facility], had heavy periods that were painful. They were also irregular.   Last pap smear was on 08/2022 and was normal.  US done on 8/13 - uterus measures 6 cm. IUD in LUS. Normal appearing ovaries. Uterus is anteverted. I reviewed the ultrasound images myself.   The following portions of the patient's history were reviewed and updated as appropriate: allergies, current medications, past family history, past medical history, past social history, past surgical history and problem list.   Review of Systems:  Pertinent items noted in HPI and remainder of comprehensive ROS otherwise negative.  Physical Exam:  BP 109/73   Pulse 89   Ht 5\' 1"  (1.549 m)   Wt 175 lb (79.4 kg)   LMP 12/24/2022 (Within Days)   BMI 33.07 kg/m  CONSTITUTIONAL: Well-developed, well-nourished female in no acute distress.  HEENT:  Normocephalic, atraumatic. External right and left ear normal. No scleral icterus.  NECK: Normal range of motion, supple, no masses noted on observation SKIN: No rash noted. Not diaphoretic. No erythema. No pallor. MUSCULOSKELETAL: Normal range of motion. No edema noted. NEUROLOGIC: Alert and oriented to person, place, and time. Normal muscle tone coordination. No cranial nerve deficit noted. PSYCHIATRIC: Normal mood and affect. Normal behavior. Normal judgment and thought content.  PELVIC:  Normal appearing EFG/vagina and cervix on speculum exam.    GYNECOLOGY OFFICE PROCEDURE NOTE IUD Insertion Procedure Note Procedure: IUD insertion with Mirena GC/CT testing: Up to date  Patient identified.  Risks, benefits and alternatives of procedure were discussed including irregular bleeding, cramping, infection, malpositioning or  misplacement of the IUD outside the uterus which may require further procedure such as laparoscopy. Also discussed >99% contraception efficacy. Consent signed. Time out performed.   Speculum inserted and cervix visualized. The strings of the IUD were grasped and pulled using ring forceps. The IUD was successfully removed in its entirety.   Cervix prepped with 3 swabs of betadine. Cervix sprayed with hurricane spray.   Grasped with a single tooth tenaculum and IUD then inserted without difficulty per manufacturer's instructions to 6 cm and strings cut to 3 cm below cervical os and all instruments removed. Pt tolerated well with minimal pain and bleeding.   Assessment and Plan:   1. Encounter for IUD removal (Primary) Kyleena removed.   2. Hair loss - Will check PCOS type labs but ultimately recommend f/u with derm. She would like to come back for the labs.  - [place] was negative for PCOS.  -     Estradiol; Future -     17-Hydroxyprogesterone; Future -     Follicle stimulating hormone; Future -     Luteinizing hormone; Future -     Testosterone,Free and Total; Future -     Ambulatory referral to Dermatology  3. Encounter for IUD insertion Mirena placed.  Discussed concerning signs/symptoms and to call if heavy bleeding, severe abdominal pain, or fever in the following 3 weeks. Manufacturer pamphlet/patient information given. Reviewed timing of efficacy for contraception and to use an alternative form of birth control until that time. -     levonorgestrel (MIRENA) 20 MCG/DAY IUD 1 each     Meds ordered this encounter  Medications   levonorgestrel (MIRENA) 20  MCG/DAY IUD 1 each     Routine preventative health maintenance measures emphasized. Please refer to After Visit Summary for other counseling recommendations.   Return if symptoms worsen or fail to improve.  Milas Hock, MD, FACOG Obstetrician & Gynecologist, [facility] for [facility], [facility]

## 2023-01-06 ENCOUNTER — Encounter: Payer: Self-pay | Admitting: Obstetrics and Gynecology

## 2023-01-06 ENCOUNTER — Ambulatory Visit (INDEPENDENT_AMBULATORY_CARE_PROVIDER_SITE_OTHER): Payer: Medicaid Other | Admitting: Obstetrics and Gynecology

## 2023-01-06 ENCOUNTER — Other Ambulatory Visit: Payer: Self-pay

## 2023-01-06 VITALS — BP 109/73 | HR 89 | Ht 61.0 in | Wt 175.0 lb

## 2023-01-06 DIAGNOSIS — L659 Nonscarring hair loss, unspecified: Secondary | ICD-10-CM | POA: Diagnosis not present

## 2023-01-06 DIAGNOSIS — Z30430 Encounter for insertion of intrauterine contraceptive device: Secondary | ICD-10-CM

## 2023-01-06 DIAGNOSIS — Z30433 Encounter for removal and reinsertion of intrauterine contraceptive device: Secondary | ICD-10-CM

## 2023-01-06 DIAGNOSIS — Z30432 Encounter for removal of intrauterine contraceptive device: Secondary | ICD-10-CM

## 2023-01-06 MED ORDER — LEVONORGESTREL 20 MCG/DAY IU IUD
1.0000 | INTRAUTERINE_SYSTEM | Freq: Once | INTRAUTERINE | Status: AC
  Administered 2023-01-06: 1 via INTRAUTERINE

## 2023-01-20 ENCOUNTER — Encounter: Payer: Self-pay | Admitting: Obstetrics and Gynecology

## 2023-01-20 DIAGNOSIS — G4733 Obstructive sleep apnea (adult) (pediatric): Secondary | ICD-10-CM | POA: Diagnosis not present

## 2023-01-21 ENCOUNTER — Other Ambulatory Visit: Payer: Self-pay

## 2023-01-21 DIAGNOSIS — L659 Nonscarring hair loss, unspecified: Secondary | ICD-10-CM

## 2023-02-20 DIAGNOSIS — G4733 Obstructive sleep apnea (adult) (pediatric): Secondary | ICD-10-CM | POA: Diagnosis not present

## 2023-02-28 ENCOUNTER — Telehealth: Payer: Self-pay | Admitting: Psychiatry

## 2023-02-28 DIAGNOSIS — F5101 Primary insomnia: Secondary | ICD-10-CM

## 2023-02-28 NOTE — Telephone Encounter (Signed)
Pt called at 11:15 requesting Rx for Trazodone at [facility]. Previous pt of Jessica. Has new provider apt 2/20. [facility] new place doesn't accept her insurance.Pt contact # [phone]

## 2023-03-01 ENCOUNTER — Other Ambulatory Visit (HOSPITAL_BASED_OUTPATIENT_CLINIC_OR_DEPARTMENT_OTHER): Payer: Self-pay

## 2023-03-01 MED ORDER — TRAZODONE HCL 100 MG PO TABS
ORAL_TABLET | ORAL | 0 refills | Status: DC
  Filled 2023-03-01: qty 30, 30d supply, fill #0

## 2023-03-01 NOTE — Telephone Encounter (Signed)
Sent 30-day supply to [facility].

## 2023-03-01 NOTE — Telephone Encounter (Signed)
Pt new provider is not at [facility].

## 2023-03-03 ENCOUNTER — Other Ambulatory Visit (HOSPITAL_BASED_OUTPATIENT_CLINIC_OR_DEPARTMENT_OTHER): Payer: Self-pay

## 2023-03-03 DIAGNOSIS — F411 Generalized anxiety disorder: Secondary | ICD-10-CM | POA: Diagnosis not present

## 2023-03-03 DIAGNOSIS — F5101 Primary insomnia: Secondary | ICD-10-CM | POA: Diagnosis not present

## 2023-03-03 DIAGNOSIS — F341 Dysthymic disorder: Secondary | ICD-10-CM | POA: Diagnosis not present

## 2023-03-04 ENCOUNTER — Other Ambulatory Visit (HOSPITAL_BASED_OUTPATIENT_CLINIC_OR_DEPARTMENT_OTHER): Payer: Self-pay

## 2023-03-04 MED ORDER — DOXEPIN HCL 50 MG PO CAPS
50.0000 mg | ORAL_CAPSULE | Freq: Every day | ORAL | 0 refills | Status: DC
  Filled 2023-03-04: qty 60, 30d supply, fill #0

## 2023-03-07 ENCOUNTER — Other Ambulatory Visit (HOSPITAL_BASED_OUTPATIENT_CLINIC_OR_DEPARTMENT_OTHER): Payer: Self-pay

## 2023-03-30 ENCOUNTER — Other Ambulatory Visit: Payer: Self-pay

## 2023-03-31 DIAGNOSIS — F5101 Primary insomnia: Secondary | ICD-10-CM | POA: Diagnosis not present

## 2023-03-31 DIAGNOSIS — F331 Major depressive disorder, recurrent, moderate: Secondary | ICD-10-CM | POA: Diagnosis not present

## 2023-03-31 DIAGNOSIS — F411 Generalized anxiety disorder: Secondary | ICD-10-CM | POA: Diagnosis not present

## 2023-04-07 ENCOUNTER — Telehealth: Payer: Medicaid Other | Admitting: Psychiatry

## 2023-04-09 ENCOUNTER — Other Ambulatory Visit (HOSPITAL_BASED_OUTPATIENT_CLINIC_OR_DEPARTMENT_OTHER): Payer: Self-pay

## 2023-04-15 ENCOUNTER — Other Ambulatory Visit (HOSPITAL_BASED_OUTPATIENT_CLINIC_OR_DEPARTMENT_OTHER): Payer: Self-pay

## 2023-04-15 DIAGNOSIS — F5101 Primary insomnia: Secondary | ICD-10-CM | POA: Diagnosis not present

## 2023-04-15 DIAGNOSIS — F331 Major depressive disorder, recurrent, moderate: Secondary | ICD-10-CM | POA: Diagnosis not present

## 2023-04-15 DIAGNOSIS — F411 Generalized anxiety disorder: Secondary | ICD-10-CM | POA: Diagnosis not present

## 2023-04-15 MED ORDER — TRAZODONE HCL 150 MG PO TABS
75.0000 mg | ORAL_TABLET | Freq: Every evening | ORAL | 0 refills | Status: DC | PRN
  Filled 2023-04-15: qty 45, 90d supply, fill #0

## 2023-04-15 MED ORDER — PAROXETINE HCL 40 MG PO TABS
40.0000 mg | ORAL_TABLET | Freq: Every day | ORAL | 0 refills | Status: DC
  Filled 2023-04-15: qty 90, 90d supply, fill #0

## 2023-04-15 MED ORDER — LAMOTRIGINE 200 MG PO TABS
200.0000 mg | ORAL_TABLET | Freq: Every day | ORAL | 0 refills | Status: DC
  Filled 2023-04-15: qty 90, 90d supply, fill #0

## 2023-04-22 DIAGNOSIS — G4733 Obstructive sleep apnea (adult) (pediatric): Secondary | ICD-10-CM | POA: Diagnosis not present

## 2023-04-25 ENCOUNTER — Ambulatory Visit: Payer: Medicaid Other | Admitting: Student

## 2023-04-26 ENCOUNTER — Ambulatory Visit: Attending: Pulmonary Disease | Admitting: Pulmonary Disease

## 2023-04-26 ENCOUNTER — Encounter: Payer: Self-pay | Admitting: Pulmonary Disease

## 2023-04-26 VITALS — BP 96/58 | HR 89 | Ht 61.0 in | Wt 176.8 lb

## 2023-04-26 DIAGNOSIS — G909 Disorder of the autonomic nervous system, unspecified: Secondary | ICD-10-CM | POA: Diagnosis not present

## 2023-04-26 NOTE — Progress Notes (Signed)
  Electrophysiology Office Note:   Date:  04/26/2023  ID:  [name], DOB 04-13-1990, MRN [id number]  Primary Cardiologist: None Primary Heart Failure: None Electrophysiologist: Lewayne Bunting, MD      History of Present Illness:   [name] is a 33 y.o. female with h/o autonomic dysfunction seen today for routine electrophysiology followup.   Since last being seen in our clinic the patient reports she has good days and bad.  More good than bad in general.  She has symptoms of lightheadedness and dizziness approximately 5 to 6 days/month.  There are some days where she will lay in bed all day.  She reports that she takes midodrine occasionally and gets headache when she takes it.  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  She notes that she ran out of her trazodone recently and felt like she was less dizzy off the medication but has to have it for sleep and depression.  She asks about liquid IV.  She denies chest pain, palpitations, dyspnea, PND, orthopnea, nausea, vomiting, dizziness, syncope, edema, weight gain, or early satiety.   Review of systems complete and found to be negative unless listed in HPI.   EP Information / Studies Reviewed:    EKG is ordered today. Personal review as below.  EKG Interpretation Date/Time:  Tuesday April 26 2023 15:19:45 EDT Ventricular Rate:  89 PR Interval:  132 QRS Duration:  82 QT Interval:  344 QTC Calculation: 418 R Axis:   63  Text Interpretation: Normal sinus rhythm with sinus arrhythmia Confirmed by Canary Brim ([id number]) on 04/26/2023 3:25:11 PM   Arrhythmia / AAD Autonomic Dysfunction > previously tried midodrine, had HA's            Physical Exam:   VS:  BP (!) 96/58   Pulse 89   Ht 5\' 1"  (1.549 m)   Wt 176 lb 12.8 oz (80.2 kg)   SpO2 98%   BMI 33.41 kg/m    Wt Readings from Last 3 Encounters:  04/26/23 176 lb 12.8 oz (80.2 kg)  01/06/23 175 lb (79.4 kg)  12/27/22 177 lb (80.3 kg)     GEN:  Well nourished, well developed in no acute distress NECK: No JVD; No carotid bruits CARDIAC: Regular rate and rhythm, no murmurs, rubs, gallops RESPIRATORY:  Clear to auscultation without rales, wheezing or rhonchi  ABDOMEN: Soft, non-tender, non-distended EXTREMITIES:  No edema; No deformity   ASSESSMENT AND PLAN:    Autonomic Dysfunction  Palpitations Dizziness  -no syncopal events  -There is at least some contribution of her regimen to treat depression with some of her symptoms as she was improved off trazodone. -Pt recommended to get at least 8 g of salt intake per day, consuming 3 L or more of fluid a day.  Consistently exercise 30 to 40 minutes at a heart rate of 70% MPHR at least 5 times a week > use a recumbent machines such as a recumbent bicycle and work on lower body resistance training such as recumbent leg press.  -stay upright as much as possible -shift positions, cross/uncross legs as "makeshift" pumping action  -ensure 8 hours sleep each night    Follow up with Dr. Ladona Ridgel in 12 months  Signed, Canary Brim, NP-C, AGACNP-BC [facility] - Electrophysiology  04/26/2023, 5:04 PM

## 2023-04-26 NOTE — Patient Instructions (Signed)
 Medication Instructions:  Your physician recommends that you continue on your current medications as directed. Please refer to the Current Medication list given to you today.  *If you need a refill on your cardiac medications before your next appointment, please call your pharmacy*  Lab Work: None ordered If you have labs (blood work) drawn today and your tests are completely normal, you will receive your results only by: MyChart Message (if you have MyChart) OR A paper copy in the mail If you have any lab test that is abnormal or we need to change your treatment, we will call you to review the results.  Follow-Up: At [facility], you and your health needs are our priority.  As part of our continuing mission to provide you with exceptional heart care, our providers are all part of one team.  This team includes your primary Cardiologist (physician) and Advanced Practice Providers or APPs (Physician Assistants and Nurse Practitioners) who all work together to provide you with the care you need, when you need it.  Your next appointment:   1 year(s)  Provider:   You will see one of the following Advanced Practice Providers on your designated Care Team:   [name], [place] Mary Needle "Mardelle Matte" Tillery, [name] Mary Brim, [name]      1st Floor: - Lobby - Registration  - Pharmacy  - Lab - Cafe  2nd Floor: - PV Lab - Diagnostic Testing (echo, CT, nuclear med)  3rd Floor: - Vacant  4th Floor: - TCTS (cardiothoracic surgery) - AFib Clinic - Structural Heart Clinic - Vascular Surgery  - Vascular Ultrasound  5th Floor: - HeartCare Cardiology (general and EP) - Clinical Pharmacy for coumadin, hypertension, lipid, weight-loss medications, and med management appointments    Valet parking services will be available as well.

## 2023-05-03 ENCOUNTER — Other Ambulatory Visit (HOSPITAL_BASED_OUTPATIENT_CLINIC_OR_DEPARTMENT_OTHER): Payer: Self-pay

## 2023-05-03 DIAGNOSIS — F5101 Primary insomnia: Secondary | ICD-10-CM | POA: Diagnosis not present

## 2023-05-03 DIAGNOSIS — F331 Major depressive disorder, recurrent, moderate: Secondary | ICD-10-CM | POA: Diagnosis not present

## 2023-05-03 DIAGNOSIS — F411 Generalized anxiety disorder: Secondary | ICD-10-CM | POA: Diagnosis not present

## 2023-05-03 MED ORDER — ALPRAZOLAM 0.5 MG PO TABS
0.5000 mg | ORAL_TABLET | Freq: Every day | ORAL | 1 refills | Status: AC | PRN
  Filled 2023-05-03: qty 30, 30d supply, fill #0

## 2023-05-05 ENCOUNTER — Other Ambulatory Visit (HOSPITAL_BASED_OUTPATIENT_CLINIC_OR_DEPARTMENT_OTHER): Payer: Self-pay

## 2023-05-16 ENCOUNTER — Other Ambulatory Visit (HOSPITAL_BASED_OUTPATIENT_CLINIC_OR_DEPARTMENT_OTHER): Payer: Self-pay

## 2023-06-15 ENCOUNTER — Telehealth (INDEPENDENT_AMBULATORY_CARE_PROVIDER_SITE_OTHER): Payer: Medicaid Other | Admitting: Neurology

## 2023-06-15 DIAGNOSIS — G4733 Obstructive sleep apnea (adult) (pediatric): Secondary | ICD-10-CM

## 2023-06-15 NOTE — Progress Notes (Signed)
 Virtual Visit via Video Note  I connected with [name] on 06/15/23 at  3:15 PM EDT by a video enabled telemedicine application and verified that I am speaking with the correct person using two identifiers.  Location: Patient: at her home Provider: in the office    I discussed the limitations of evaluation and management by telemedicine and the availability of in person appointments. The patient expressed understanding and agreed to proceed.  History of Present Illness: Update June 15, 2023 SS: Here for cpap revisit. Has not used CPAP in the last few months due to medication changes, illness. Has been feeling fatigued, ready to resume CPAP. Has FFM to use.  Last visit in November she was going to try new CPAP mask, her AHI was 5.4 (central 4.3).   Update December 01, 2022 SS: Saw Dr. Omar Bibber in August 2024 reporting snoring and excessive daytime somnolence.  Had HST 09/22/2022 showing moderate OSA total AHI 20.1/hour and O2 nadir of 85%.  Recommended starting CPAP. Started using FFM, it was leaking, got stretched out. Now using nasal mask Air 20, will try tonight. Has not noted much improvement since starting CPAP. Setup date was 10/21/22. Found out several Vitamin D  deficiency recently.GI issues, just got all clear for C.Diff. Having colonoscopy coming up.  CPAP data is attached, 93% usage, greater than 4 hours 70%.  6 to 12 cm water.  Leak 9.3.  AHI 5.4.  09/06/22 Dr. Omar Bibber: I saw your patient, [name], upon your kind request in my sleep clinic today for evaluation of her sleep disorder, in particular, concern for underlying obstructive sleep apnea.  The patient is unaccompanied today.  As you know, [name] is a 33 year old female with an underlying medical history of ADHD, anxiety, depression, allergies, reflux disease, history of syncope and TIA (for which she saw my colleague, Dr. Janett Medin), currently on treatment for C. difficile colitis, and obesity, who reports snoring and excessive  daytime somnolence.  Her Epworth sleepiness score is 4 out of 24, fatigue severity score is 53 out of 63. I reviewed your office note from 07/20/2022.  She has felt tired during the day for years.  She currently does not work, she is a Consulting civil engineer, Chief Executive Officer.  Bedtime varies, typically between 2 and 4 AM and rise time about 12 hours later.  She is a non-smoker, drinks alcohol rarely, drinks occasional caffeine, no daily caffeine consumption.  Her mom has sleep apnea.  She lives with her mother and brother.  She has no nightly nocturia and denies recurrent morning headaches.  She does not have any pets in the household.  She does not have a TV on in her bedroom at night.     Observations/Objective: Via video visit, is alert and oriented, seated in chair   Assessment and Plan: 1.  OSA on CPAP  -She will resume CPAP use.  When she resumes, reach out to me in 4 to 6 weeks and I can review the data.  She previously had very good benefit from CPAP.  She is motivated to resume nightly use minimum 4 hours.  Her previous download showed AHI 5.4 (central 4.3)  Follow Up Instructions: 6 months  VV   I discussed the assessment and treatment plan with the patient. The patient was provided an opportunity to ask questions and all were answered. The patient agreed with the plan and demonstrated an understanding of the instructions.   The patient was advised to call back or  seek an in-person evaluation if the symptoms worsen or if the condition fails to improve as anticipated.  Cortland Ding, DNP  [facility] [address] [place], [place] [address] [phone]

## 2023-06-15 NOTE — Patient Instructions (Signed)
 Please resume CPAP use minimum 4 hours nightly.  Once you are back using for 4-6 weeks, message me and I can pull the data to review.  We recommend nightly use minimum 4 hours.  Please reach out if you have any problems.  Follow-up in 6 months.  Thanks!!

## 2023-06-21 ENCOUNTER — Other Ambulatory Visit (HOSPITAL_BASED_OUTPATIENT_CLINIC_OR_DEPARTMENT_OTHER): Payer: Self-pay

## 2023-06-21 DIAGNOSIS — F331 Major depressive disorder, recurrent, moderate: Secondary | ICD-10-CM | POA: Diagnosis not present

## 2023-06-21 DIAGNOSIS — F5101 Primary insomnia: Secondary | ICD-10-CM | POA: Diagnosis not present

## 2023-06-21 DIAGNOSIS — F411 Generalized anxiety disorder: Secondary | ICD-10-CM | POA: Diagnosis not present

## 2023-06-21 MED ORDER — TRAZODONE HCL 150 MG PO TABS
75.0000 mg | ORAL_TABLET | Freq: Every evening | ORAL | 0 refills | Status: DC | PRN
  Filled 2023-06-23 – 2023-06-25 (×3): qty 45, 90d supply, fill #0

## 2023-06-21 MED ORDER — LAMOTRIGINE 200 MG PO TABS
200.0000 mg | ORAL_TABLET | Freq: Every day | ORAL | 0 refills | Status: DC
  Filled 2023-06-21 – 2023-07-07 (×5): qty 90, 90d supply, fill #0

## 2023-06-21 MED ORDER — PAROXETINE HCL 40 MG PO TABS
40.0000 mg | ORAL_TABLET | Freq: Every day | ORAL | 0 refills | Status: DC
  Filled 2023-06-21 – 2023-06-25 (×4): qty 90, 90d supply, fill #0

## 2023-06-23 ENCOUNTER — Other Ambulatory Visit (HOSPITAL_BASED_OUTPATIENT_CLINIC_OR_DEPARTMENT_OTHER): Payer: Self-pay

## 2023-06-24 ENCOUNTER — Other Ambulatory Visit (HOSPITAL_BASED_OUTPATIENT_CLINIC_OR_DEPARTMENT_OTHER): Payer: Self-pay

## 2023-06-25 ENCOUNTER — Other Ambulatory Visit (HOSPITAL_BASED_OUTPATIENT_CLINIC_OR_DEPARTMENT_OTHER): Payer: Self-pay

## 2023-06-27 ENCOUNTER — Other Ambulatory Visit: Payer: Self-pay

## 2023-07-07 ENCOUNTER — Other Ambulatory Visit (HOSPITAL_BASED_OUTPATIENT_CLINIC_OR_DEPARTMENT_OTHER): Payer: Self-pay

## 2023-08-10 DIAGNOSIS — G4733 Obstructive sleep apnea (adult) (pediatric): Secondary | ICD-10-CM | POA: Diagnosis not present

## 2023-09-05 ENCOUNTER — Encounter: Payer: Self-pay | Admitting: Physician Assistant

## 2023-09-05 ENCOUNTER — Ambulatory Visit (INDEPENDENT_AMBULATORY_CARE_PROVIDER_SITE_OTHER): Admitting: Physician Assistant

## 2023-09-05 VITALS — BP 110/76 | HR 87 | Temp 98.4°F | Ht 61.0 in | Wt 171.2 lb

## 2023-09-05 DIAGNOSIS — E559 Vitamin D deficiency, unspecified: Secondary | ICD-10-CM

## 2023-09-05 DIAGNOSIS — R42 Dizziness and giddiness: Secondary | ICD-10-CM | POA: Diagnosis not present

## 2023-09-05 DIAGNOSIS — R5383 Other fatigue: Secondary | ICD-10-CM

## 2023-09-05 DIAGNOSIS — H9313 Tinnitus, bilateral: Secondary | ICD-10-CM

## 2023-09-05 NOTE — Progress Notes (Signed)
 [name] is a 33 y.o. female here for a follow up of a pre-existing problem.  History of Present Illness:   Chief Complaint  Patient presents with   Referral    Pt would like a referral for ENT, to help with dizziness due to crystals in her ears.   Fatigue    Pt c/o worsening fatigue in the past 3 months.    Hearing issues/Tinnitus/Positional Lightheadedness Has had tinnitus b/l for years Feels as though she has had difficulty hearing for some time as well Gets positional dizziness -- would like to see ENT for formal evaluation Declines vestibular rehab Has had neurology evaluation in the past for episodes of decreased attentiveness/lightheadedness that required evaluation with neurology and cardiology in 2018 --- EEG and cardiac monitoring negative -- she was instructed to get MRI but she did not get this done due to finances.  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 She has history of prediabetes, vitamin D /B12 and iron deficiency She cannot take iron per her gastrointestinal doctor due to constipation Declines new/worsening issues with mental health Would like autism spectrum disorder evaluation at some point   Past Medical History:  Diagnosis Date   ADHD (attention deficit hyperactivity disorder)    Allergy    Anxiety    Autonomic nervous system disorder    states she has falling but not syncope due to loss of blood pressure   Depression    Environmental allergies    GERD (gastroesophageal reflux disease)    Myopia of both eyes    Obesity    Syncope and collapse    Vitamin B 12 deficiency    Vitamin D  deficiency      Social History   Tobacco Use   Smoking status: Never    Passive exposure: Never   Smokeless tobacco: Never  Vaping Use   Vaping status: Never Used  Substance Use Topics   Alcohol use: Not Currently    Comment: rarely, <1 drink per month, typically wine   Drug use: No    No  past surgical history on file.  Family History  Problem Relation Age of Onset   Depression Mother    Hyperlipidemia Mother    Sleep apnea Mother    Colon polyps Mother    Bipolar disorder Father    Diabetes Father    Hyperlipidemia Father    Colon polyps Father    Bipolar disorder Brother    Alzheimer's disease Maternal Grandmother    Diabetes Maternal Grandmother    Heart disease Maternal Grandmother    Hyperlipidemia Maternal Grandmother    Hypertension Maternal Grandmother    Alzheimer's disease Maternal Grandfather    Heart disease Maternal Grandfather    Hyperlipidemia Maternal Grandfather    Sleep apnea Paternal Grandmother    Heart disease Paternal Grandmother    Hypertension Paternal Grandmother    Snoring Paternal Grandmother    Heart attack Paternal Grandfather    Colon cancer Paternal Grandfather    Breast cancer Maternal Aunt 40   Bipolar disorder Paternal Aunt    Epilepsy Cousin    Sleep apnea Other        aunts and uncles   Stomach cancer Neg Hx    Esophageal cancer Neg Hx    Pancreatic cancer Neg Hx     Allergies  Allergen Reactions   Cymbalta [Duloxetine Hcl] Other (See Comments)    Hypotension   Daytrana  [Methylphenidate ] Rash  Like a sunburn   Duloxetine Other (See Comments)    Fainting    Current Medications:   Current Outpatient Medications:    ALPRAZolam  (XANAX ) 0.5 MG tablet, Take 1 tablet (0.5 mg total) by mouth as needed for anxiety or sleep., Disp: 30 tablet, Rfl: 4   ALPRAZolam  (XANAX ) 0.5 MG tablet, Take 1 tablet (0.5 mg total) by mouth daily as needed., Disp: 30 tablet, Rfl: 1   ascorbic acid (VITAMIN C) 500 MG tablet, Take 500 mg by mouth every other day., Disp: , Rfl:    Cyanocobalamin  (VITAMIN B-12 PO), Take 1 tablet by mouth daily., Disp: , Rfl:    lamoTRIgine  (LAMICTAL ) 200 MG tablet, Take 1 tablet (200 mg total) by mouth daily., Disp: 90 tablet, Rfl: 0   levonorgestrel  (MIRENA , 52 MG,) 20 MCG/DAY IUD, 1 each by Intrauterine  route once., Disp: , Rfl:    LORATADINE PO, Take 10 mg by mouth daily. (Patient taking differently: Take 10 mg by mouth as needed.), Disp: , Rfl:    Meclizine  HCl (BONINE PO), Take by mouth daily as needed. , Disp: , Rfl:    midodrine  (PROAMATINE ) 5 MG tablet, Take 1 tablet (5 mg total) by mouth 2 (two) times daily with a meal., Disp: 180 tablet, Rfl: 2   Multiple Vitamins-Minerals (MULTIVITAL) tablet, Take 1 tablet by mouth daily. Gummy, Disp: , Rfl:    Naproxen Sodium (ALEVE PO), Take 2 tablets by mouth daily as needed (for pain)., Disp: , Rfl:    PARoxetine  (PAXIL ) 40 MG tablet, Take 1 tablet (40 mg total) by mouth daily., Disp: 90 tablet, Rfl: 0   traZODone  (DESYREL ) 150 MG tablet, Take 0.5 tablets (75 mg total) by mouth at bedtime as needed for sleep, Disp: 45 tablet, Rfl: 0   Review of Systems:   Negative unless otherwise specified per HPI.  Vitals:   Vitals:   09/05/23 1511  BP: 110/76  Pulse: 87  Temp: 98.4 F (36.9 C)  TempSrc: Temporal  SpO2: 97%  Weight: 171 lb 4 oz (77.7 kg)  Height: 5' 1 (1.549 m)     Body mass index is 32.36 kg/m.  Physical Exam:   Physical Exam Vitals and nursing note reviewed.  Constitutional:      General: She is not in acute distress.    Appearance: She is well-developed. She is not ill-appearing or toxic-appearing.  Cardiovascular:     Rate and Rhythm: Normal rate and regular rhythm.     Pulses: Normal pulses.     Heart sounds: Normal heart sounds, S1 normal and S2 normal.  Pulmonary:     Effort: Pulmonary effort is normal.     Breath sounds: Normal breath sounds.  Skin:    General: Skin is warm and dry.  Neurological:     Mental Status: She is alert.     GCS: GCS eye subscore is 4. GCS verbal subscore is 5. GCS motor subscore is 6.  Psychiatric:        Speech: Speech normal.        Behavior: Behavior normal. Behavior is cooperative.     Assessment and Plan:   Fatigue, unspecified type Unclear etiology UpToDate on all  cancer screenings She is agreeable to blood work and urinalysis She cannot do blood work today -- requires pre-medication and driver She will follow up to schedule this as able Will also add on autoimmune testing Follow up based on results and clinical symptom(s)   Positional lightheadedness She has dysautonomia but does not  feel like this is related Will order MRI given ongoing symptom(s) and was initially recommended to have MRI and MRA (angiogram) in 2018 Will order MRI without contrast and not complete MRA (angiogram) at this time as she declines due to severe needle phobia Declined vestibular rehab Refer to ENT per her request  Tinnitus of both ears Refer to ENT per her request  Vitamin D  deficiency Update vitamin D  and provide recommendations   Lucie Buttner, PA-C

## 2023-09-05 NOTE — Patient Instructions (Signed)
 It was great to see you!  We will get blood work whenever you are ready  I've ordered MRI of your brain, [facility] should contact you to schedule  We will also order ENT evaluation  Take care,  Deneane Stifter PA-C

## 2023-09-06 LAB — URINE CULTURE
MICRO NUMBER:: 16877964
Result:: NO GROWTH
SPECIMEN QUALITY:: ADEQUATE

## 2023-09-07 ENCOUNTER — Ambulatory Visit: Payer: Self-pay | Admitting: Physician Assistant

## 2023-09-11 ENCOUNTER — Other Ambulatory Visit: Payer: Self-pay | Admitting: Internal Medicine

## 2023-09-11 DIAGNOSIS — R55 Syncope and collapse: Secondary | ICD-10-CM

## 2023-09-11 DIAGNOSIS — G909 Disorder of the autonomic nervous system, unspecified: Secondary | ICD-10-CM

## 2023-09-13 ENCOUNTER — Other Ambulatory Visit (HOSPITAL_BASED_OUTPATIENT_CLINIC_OR_DEPARTMENT_OTHER): Payer: Self-pay

## 2023-09-13 MED ORDER — MIDODRINE HCL 5 MG PO TABS
5.0000 mg | ORAL_TABLET | Freq: Two times a day (BID) | ORAL | 1 refills | Status: DC
  Filled 2023-09-13: qty 60, 30d supply, fill #0
  Filled 2023-10-09: qty 60, 30d supply, fill #1
  Filled 2023-11-15: qty 60, 30d supply, fill #2
  Filled 2024-01-03: qty 60, 30d supply, fill #3

## 2023-09-14 ENCOUNTER — Telehealth: Payer: Self-pay | Admitting: *Deleted

## 2023-09-14 NOTE — Telephone Encounter (Signed)
 Copied from CRM [id number]. Topic: Referral - Status >> Sep 14, 2023  2:19 PM Pinkey ORN wrote: Reason for CRM: Referral Status >> Sep 14, 2023  2:20 PM Pinkey ORN wrote: Patient states there was supposed to been a referral put in for an ENT specialist. Patient is requesting to be sent some place that accepts medicaid.   Spoke to pt told her letter was sent to her My Chart regarding ENT referral to call to schedule. Gave pt phone number for [facility] [phone]. Pt verbalized understanding.

## 2023-09-19 ENCOUNTER — Other Ambulatory Visit (HOSPITAL_BASED_OUTPATIENT_CLINIC_OR_DEPARTMENT_OTHER): Payer: Self-pay

## 2023-09-19 DIAGNOSIS — F411 Generalized anxiety disorder: Secondary | ICD-10-CM | POA: Diagnosis not present

## 2023-09-19 DIAGNOSIS — F331 Major depressive disorder, recurrent, moderate: Secondary | ICD-10-CM | POA: Diagnosis not present

## 2023-09-19 DIAGNOSIS — F5101 Primary insomnia: Secondary | ICD-10-CM | POA: Diagnosis not present

## 2023-09-19 MED ORDER — PAROXETINE HCL 40 MG PO TABS
40.0000 mg | ORAL_TABLET | Freq: Every day | ORAL | 0 refills | Status: DC
  Filled 2023-11-01: qty 90, 90d supply, fill #0

## 2023-09-19 MED ORDER — TRAZODONE HCL 150 MG PO TABS
75.0000 mg | ORAL_TABLET | Freq: Every evening | ORAL | 0 refills | Status: AC | PRN
  Filled 2023-10-09: qty 45, 90d supply, fill #0

## 2023-09-19 MED ORDER — LAMOTRIGINE 200 MG PO TABS
200.0000 mg | ORAL_TABLET | Freq: Every day | ORAL | 0 refills | Status: DC
  Filled 2023-10-09: qty 90, 90d supply, fill #0

## 2023-09-20 ENCOUNTER — Other Ambulatory Visit (HOSPITAL_BASED_OUTPATIENT_CLINIC_OR_DEPARTMENT_OTHER): Payer: Self-pay

## 2023-09-20 ENCOUNTER — Ambulatory Visit (INDEPENDENT_AMBULATORY_CARE_PROVIDER_SITE_OTHER): Payer: Medicaid Other | Admitting: Dermatology

## 2023-09-20 ENCOUNTER — Encounter: Payer: Self-pay | Admitting: Dermatology

## 2023-09-20 VITALS — BP 111/77

## 2023-09-20 DIAGNOSIS — L649 Androgenic alopecia, unspecified: Secondary | ICD-10-CM

## 2023-09-20 MED ORDER — MINOXIDIL 2.5 MG PO TABS
1.2500 mg | ORAL_TABLET | Freq: Every day | ORAL | 2 refills | Status: AC
  Filled 2023-09-20: qty 30, 60d supply, fill #0
  Filled 2023-11-01 – 2023-11-15 (×2): qty 30, 60d supply, fill #1
  Filled 2024-01-03: qty 30, 60d supply, fill #2

## 2023-09-20 NOTE — Patient Instructions (Addendum)
 Date: Tue Sep 20 2023  Hello [place],  Thank you for visiting today. Here is a summary of the key instructions:  - Medications:   - Take minoxidil  2.5 mg, half a tablet daily with dinner for 1 month and then increase to 1 whole tablet   - Watch for lightheadedness when waking up   - Drink plenty of fluids  - Supplements:   - Continue taking vitamin B12, vitamin C every other day, and multigummy vitamin   - Add collagen supplement (Vital Protein brand recommended)   - Available at [facility] or [facility]   - Comes in powder, gummies, capsules, or chocolate forms  - Follow-up:   - Return in 4 months for hair loss follow-up   - If you want to switch from oral to topical minoxidil , send a message on MyChart  - Other Instructions:   - If you experience increased hair shedding in the first month, continue treatment as this is normal   - Treatment needs to be continued long-term to maintain results   - [facility] is the pharmacy used for prescriptions   - Message through MyChart for any questions  Please reach out if you have any questions or concerns.  Warm regards,  Dr. Delon [facility]           Important Information  Due to recent changes in healthcare laws, you may see results of your pathology and/or laboratory studies on MyChart before the doctors have had a chance to review them. We understand that in some cases there may be results that are confusing or concerning to you. Please understand that not all results are received at the same time and often the doctors may need to interpret multiple results in order to provide you with the best plan of care or course of treatment. Therefore, we ask that you please give us  2 business days to thoroughly review all your results before contacting the office for clarification. Should we see a critical lab result, you will be contacted sooner.   If You Need Anything After Your Visit  If you have any questions or concerns for  your doctor, please call our main line at [phone] If no one answers, please leave a voicemail as directed and we will return your call as soon as possible. Messages left after 4 pm will be answered the following business day.   You may also send us  a message via MyChart. We typically respond to MyChart messages within 1-2 business days.  For prescription refills, please ask your pharmacy to contact our office. Our fax number is [phone].  If you have an urgent issue when the clinic is closed that cannot wait until the next business day, you can page your doctor at the number below.    Please note that while we do our best to be available for urgent issues outside of office hours, we are not available 24/7.   If you have an urgent issue and are unable to reach us , you may choose to seek medical care at your doctor's office, retail clinic, urgent care center, or emergency room.  If you have a medical emergency, please immediately call 911 or go to the emergency department. In the event of inclement weather, please call our main line at [phone] for an update on the status of any delays or closures.  Dermatology Medication Tips: Please keep the boxes that topical medications come in in order to help keep track of the instructions about where and how  to use these. Pharmacies typically print the medication instructions only on the boxes and not directly on the medication tubes.   If your medication is too expensive, please contact our office at [phone] or send us  a message through MyChart.   We are unable to tell what your co-pay for medications will be in advance as this is different depending on your insurance coverage. However, we may be able to find a substitute medication at lower cost or fill out paperwork to get insurance to cover a needed medication.   If a prior authorization is required to get your medication covered by your insurance company, please allow us  1-2 business  days to complete this process.  Drug prices often vary depending on where the prescription is filled and some pharmacies may offer cheaper prices.  The website www.goodrx.com contains coupons for medications through different pharmacies. The prices here do not account for what the cost may be with help from insurance (it may be cheaper with your insurance), but the website can give you the price if you did not use any insurance.  - You can print the associated coupon and take it with your prescription to the pharmacy.  - You may also stop by our office during regular business hours and pick up a GoodRx coupon card.  - If you need your prescription sent electronically to a different pharmacy, notify our office through [facility] or by phone at [phone]

## 2023-09-20 NOTE — Progress Notes (Signed)
   New Patient Visit   Subjective  [name] is a 33 y.o. female who presents for the following: Hair loss - She has noticed it thinning for at least 10 years. She sometimes gets sores on her scalp but no itching. 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 Her mother noticed thinning of her hair at a young age also - around age 105.  Accompanied by mother today  The following portions of the chart were reviewed this encounter and updated as appropriate: medications, allergies, medical history  Review of Systems:  No other skin or systemic complaints except as noted in HPI or Assessment and Plan.  Objective  Well appearing patient in no apparent distress; mood and affect are within normal limits.   A focused examination was performed of the following areas: Scalp   Relevant exam findings are noted in the Assessment and Plan.             Assessment & Plan    1. Androgenetic Alopecia - Assessment: Patient presents with a 10-year history of hair thinning, clinically consistent with androgenetic alopecia. The pattern of hair loss is characteristic, affecting the crown and frontal areas while sparing the occipital region. Family history is significant, with maternal onset of hair thinning at age 50. Patient has not previously sought dermatological care due to lack of health insurance until 1-2 years ago. No prior treatments have been attempted. The condition is attributed to genetic predisposition and follicular sensitivity to androgens, despite normal hormone levels. Patient reports additional androgenic symptoms, including facial and body hair growth in atypical locations.  - Plan:    Initiate oral minoxidil  2.5 mg, half tablet daily with dinner for 1 month    Informed patient of potential side effects: lightheadedness, dizziness, unwanted hair growth    Advised to monitor for symptoms of hypotension, especially upon waking    Instructed to maintain adequate  fluid intake    Recommend over-the-counter Vital Protein collagen supplement daily    Educated patient on chronic nature of treatment and need for continued use to maintain results    Discussed option to switch to topical minoxidil  5% or 8% compound with finasteride if oral medication is not tolerated    Advised patient that stress may exacerbate condition, but is not a primary cause   Doses of oral minoxidil  for hair loss are considered 'low dose'. This is because the doses used for hair loss are much lower than the doses which are used for conditions such as high blood pressure (hypertension). The doses used for hypertension are 10-40mg  per day.  Side effects are uncommon at the low doses (up to 2.5 mg/day) used to treat hair loss. Potential side effects, more commonly seen at higher doses, include: Increase in hair growth (hypertrichosis) elsewhere on face and body Temporary hair shedding upon starting medication which may last up to 4 weeks Low blood pressure and feeling lightheaded or dizzy when standing up quickly Fast or irregular heartbeat Headaches   Follow-up in 4 months to assess treatment response and tolerability.      Return in about 4 months (around 01/20/2024) for Alopecia.  I, Roseline Hutchinson, CMA, am acting as scribe for [employer], DO .   Documentation: I have reviewed the above documentation for accuracy and completeness, and I agree with the above.  Delon Lenis, DO

## 2023-09-21 ENCOUNTER — Ambulatory Visit
Admission: RE | Admit: 2023-09-21 | Discharge: 2023-09-21 | Disposition: A | Discharge status: Home / Self Care | Source: Ambulatory Visit | Attending: Physician Assistant

## 2023-09-21 DIAGNOSIS — R42 Dizziness and giddiness: Secondary | ICD-10-CM

## 2023-10-03 DIAGNOSIS — F341 Dysthymic disorder: Secondary | ICD-10-CM | POA: Diagnosis not present

## 2023-10-03 DIAGNOSIS — F411 Generalized anxiety disorder: Secondary | ICD-10-CM | POA: Diagnosis not present

## 2023-10-03 DIAGNOSIS — F5101 Primary insomnia: Secondary | ICD-10-CM | POA: Diagnosis not present

## 2023-10-10 ENCOUNTER — Other Ambulatory Visit (HOSPITAL_BASED_OUTPATIENT_CLINIC_OR_DEPARTMENT_OTHER): Payer: Self-pay

## 2023-10-10 ENCOUNTER — Other Ambulatory Visit: Payer: Self-pay

## 2023-10-10 DIAGNOSIS — F5101 Primary insomnia: Secondary | ICD-10-CM | POA: Diagnosis not present

## 2023-10-10 DIAGNOSIS — F341 Dysthymic disorder: Secondary | ICD-10-CM | POA: Diagnosis not present

## 2023-10-10 DIAGNOSIS — F411 Generalized anxiety disorder: Secondary | ICD-10-CM | POA: Diagnosis not present

## 2023-10-17 DIAGNOSIS — F341 Dysthymic disorder: Secondary | ICD-10-CM | POA: Diagnosis not present

## 2023-10-17 DIAGNOSIS — F5101 Primary insomnia: Secondary | ICD-10-CM | POA: Diagnosis not present

## 2023-10-17 DIAGNOSIS — F411 Generalized anxiety disorder: Secondary | ICD-10-CM | POA: Diagnosis not present

## 2023-10-24 DIAGNOSIS — F341 Dysthymic disorder: Secondary | ICD-10-CM | POA: Diagnosis not present

## 2023-10-24 DIAGNOSIS — F5101 Primary insomnia: Secondary | ICD-10-CM | POA: Diagnosis not present

## 2023-10-24 DIAGNOSIS — F411 Generalized anxiety disorder: Secondary | ICD-10-CM | POA: Diagnosis not present

## 2023-10-31 DIAGNOSIS — F411 Generalized anxiety disorder: Secondary | ICD-10-CM | POA: Diagnosis not present

## 2023-10-31 DIAGNOSIS — F5101 Primary insomnia: Secondary | ICD-10-CM | POA: Diagnosis not present

## 2023-10-31 DIAGNOSIS — F341 Dysthymic disorder: Secondary | ICD-10-CM | POA: Diagnosis not present

## 2023-11-01 ENCOUNTER — Other Ambulatory Visit (HOSPITAL_BASED_OUTPATIENT_CLINIC_OR_DEPARTMENT_OTHER): Payer: Self-pay

## 2023-11-01 ENCOUNTER — Other Ambulatory Visit: Payer: Self-pay

## 2023-11-02 ENCOUNTER — Ambulatory Visit (INDEPENDENT_AMBULATORY_CARE_PROVIDER_SITE_OTHER): Admitting: Audiology

## 2023-11-02 ENCOUNTER — Encounter (INDEPENDENT_AMBULATORY_CARE_PROVIDER_SITE_OTHER): Payer: Self-pay

## 2023-11-02 ENCOUNTER — Other Ambulatory Visit (HOSPITAL_BASED_OUTPATIENT_CLINIC_OR_DEPARTMENT_OTHER): Payer: Self-pay

## 2023-11-02 ENCOUNTER — Ambulatory Visit (INDEPENDENT_AMBULATORY_CARE_PROVIDER_SITE_OTHER)

## 2023-11-02 VITALS — BP 97/66 | HR 98 | Temp 97.8°F | Ht 62.0 in | Wt 170.0 lb

## 2023-11-02 DIAGNOSIS — H608X3 Other otitis externa, bilateral: Secondary | ICD-10-CM | POA: Diagnosis not present

## 2023-11-02 DIAGNOSIS — R42 Dizziness and giddiness: Secondary | ICD-10-CM

## 2023-11-02 DIAGNOSIS — Z0110 Encounter for examination of ears and hearing without abnormal findings: Secondary | ICD-10-CM | POA: Diagnosis not present

## 2023-11-02 MED ORDER — FLUOCINOLONE ACETONIDE 0.01 % OT OIL
5.0000 [drp] | TOPICAL_OIL | Freq: Two times a day (BID) | OTIC | 0 refills | Status: AC
  Filled 2023-11-02: qty 20, 2d supply, fill #0

## 2023-11-02 NOTE — Progress Notes (Signed)
 Dear Dr. Job, Here is my assessment for our mutual patient, [name]. Thank you for allowing me the opportunity to care for your patient. Please do not hesitate to contact me should you have any other questions. Sincerely, Dr. Penne Croak  Otolaryngology Clinic Note Referring provider: Dr. Job HPI:  Discussed the use of AI scribe software for clinical note transcription with the patient, who gave verbal consent to proceed.  History of Present Illness [name] is a 33 year old female with autonomic nervous system disorder who presents with dizziness and ear-related symptoms. She is accompanied by her mother, [name].  Vertigo and dizziness - Dizziness characterized by sensation of the world tilting or spinning - Episodes often triggered by lying in bed for extended periods - Duration of episodes can last for hours - Associated with significant nausea - Frequently uses meclizine  for symptom management, but did not take it on the day of the visit - No prior Epley maneuver performed - No vestibular or balance therapy attempted - Primary care physician suspected benign paroxysmal positional vertigo (BPPV) due to possible loose otoconia, but no confirmatory testing completed  Auditory symptoms - Two types of symptoms: complete hearing loss with ringing, and continuous background noise at a lower level - Chronic ear tenderness - Perceived progressive hearing loss over time - Avoids earplugs and earbuds due to discomfort and itching, attributed to hearing aids  Headache and migraine symptoms - Occasional migraines, associated with use of midodrine  - Experiences sharp, ice-pick-like pain in the head as a side effect of midodrine  - Currently takes a reduced dose of midodrine  due to side effects  Neurological and cervical imaging findings - 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 I personally reviewed and interpreted     Hearing WNL AU. WRS 100% AU. Type A AU.  PMH/Meds/All/SocHx/FamHx/ROS:   Past Medical History:  Diagnosis Date   ADHD (attention deficit hyperactivity disorder)    Allergy    Anxiety    Autonomic nervous system disorder    states she has falling but not syncope due to loss of blood pressure   Depression    Environmental allergies    GERD (gastroesophageal reflux disease)    Myopia of both eyes    Obesity    Syncope and collapse    Vitamin B 12 deficiency    Vitamin D  deficiency      History reviewed. No pertinent surgical history.  Family History  Problem Relation Age of Onset   Depression Mother    Hyperlipidemia Mother    Sleep apnea Mother    Colon polyps Mother    Bipolar disorder Father    Diabetes Father    Hyperlipidemia Father    Colon polyps Father    Bipolar disorder Brother    Alzheimer's disease Maternal Grandmother    Diabetes Maternal Grandmother    Heart disease Maternal Grandmother    Hyperlipidemia Maternal Grandmother    Hypertension Maternal Grandmother    Alzheimer's disease Maternal Grandfather    Heart disease Maternal Grandfather    Hyperlipidemia Maternal Grandfather    Sleep apnea Paternal Grandmother    Heart disease Paternal Grandmother  Hypertension Paternal Grandmother    Snoring Paternal Grandmother    Heart attack Paternal Grandfather    Colon cancer Paternal Grandfather    Breast cancer Maternal Aunt 40   Bipolar disorder Paternal Aunt    Epilepsy Cousin    Sleep apnea Other        aunts and uncles   Stomach cancer Neg Hx    Esophageal cancer Neg Hx    Pancreatic cancer Neg Hx       Social Connections: Socially Isolated (09/04/2023)   Social Connection and Isolation Panel    Frequency of Communication with Friends and Family: Once a week    Frequency of Social Gatherings with Friends and Family: Once a week    Attends Religious Services: Never    Database administrator or Organizations: No    Attends Engineer, structural: Not on file    Marital Status: Never married      Current Outpatient Medications:    Fluocinolone Acetonide 0.01 % OIL, Place 5 drops in ear(s) 2 (two) times daily for 5 days., Disp: 20 mL, Rfl: 0   ALPRAZolam  (XANAX ) 0.5 MG tablet, Take 1 tablet (0.5 mg total) by mouth as needed for anxiety or sleep., Disp: 30 tablet, Rfl: 4   ALPRAZolam  (XANAX ) 0.5 MG tablet, Take 1 tablet (0.5 mg total) by mouth daily as needed., Disp: 30 tablet, Rfl: 1   ascorbic acid (VITAMIN C) 500 MG tablet, Take 500 mg by mouth every other day., Disp: , Rfl:    Cyanocobalamin  (VITAMIN B-12 PO), Take 1 tablet by mouth daily., Disp: , Rfl:    lamoTRIgine  (LAMICTAL ) 200 MG tablet, Take 1 tablet (200 mg total) by mouth daily., Disp: 90 tablet, Rfl: 0   levonorgestrel  (MIRENA , 52 MG,) 20 MCG/DAY IUD, 1 each by Intrauterine route once., Disp: , Rfl:    LORATADINE PO, Take 10 mg by mouth daily. (Patient taking differently: Take 10 mg by mouth as needed.), Disp: , Rfl:    Meclizine  HCl (BONINE PO), Take by mouth daily as needed. , Disp: , Rfl:    midodrine  (PROAMATINE ) 5 MG tablet, Take 1 tablet (5 mg total) by mouth 2 (two) times daily with a meal., Disp: 180 tablet, Rfl: 1   minoxidil  (LONITEN ) 2.5 MG tablet, Take 0.5 tablets (1.25 mg total) by mouth daily., Disp: 30 tablet, Rfl: 2   Multiple Vitamins-Minerals (MULTIVITAL) tablet, Take 1 tablet by mouth daily. Gummy, Disp: , Rfl:    Naproxen Sodium (ALEVE PO), Take 2 tablets by mouth daily as needed (for pain)., Disp: , Rfl:    PARoxetine  (PAXIL ) 40 MG tablet, Take 1 tablet (40 mg total) by mouth daily., Disp: 90 tablet,  Rfl: 0   traZODone  (DESYREL ) 150 MG tablet, Take 0.5 tablets (75 mg total) by mouth at bedtime as needed for sleep., Disp: 45 tablet, Rfl: 0   Physical Exam:   BP 97/66   Pulse 98   Temp 97.8 F (36.6 C)   Ht 5' 2 (1.575 m)   Wt 170 lb (77.1 kg)   SpO2 98%   BMI 31.09 kg/m   The patient was awake, alert, and appropriate. The external ears were inspected, and otoscopy was performed to evaluate the external auditory canals and tympanic membranes. The nasal cavity and septum were examined for mucosal changes, obstruction, or discharge. The oral cavity and oropharynx were inspected for mucosal lesions, infection, or tonsillar hypertrophy. The neck was palpated for lymphadenopathy, thyroid  abnormalities, or other masses. Cranial nerve  function was grossly intact.  Pertinent Findings: Physical Exam HEENT: Ears normal. Tympanic membranes normal bilaterally. Oral cavity normal. Dix hallpike neg bilaterally  Seprately Identifiable Procedures:  I personally ordered, reviewed and interpreted the following with the patient today   Procedure: Bilateral ear microscopy using microscope (CPT [id number]) Pre-procedure diagnosis: dizziness Post-procedure diagnosis: same Indication: see above; given patient's otologic complaints and history, for improved and comprehensive examination of external ear and tympanic membrane, bilateral otologic examination using microscope was performed. Prior to proceeding, verbal consent was obtained after discussion of R/B/A  Procedure: Patient was placed semi-recumbent. Both ear canals were examined using the microscope with findings below. Patient tolerated the procedure well.  Right ear:  No significant lesions pinna. EAC: no significant lesions. Canal is clear. Eczematoid changes. mild TM: Intact   Left ear:  No significant lesions pinna. EAC: no significant lesions. Canal is clear. Eczematoid changes. mild TM: Intact    Impression & Plans:  Trysta Showman is a  33 y.o. female  1. Dizziness and giddiness   2. Chronic eczematous otitis externa of both ears     - Findings and diagnoses discussed in detail with the patient. - Risks, benefits, and alternatives were reviewed. Through shared decision making, the patient elects to proceed with below. Assessment & Plan Benign paroxysmal positional vertigo (BPPV) Chronic dizziness and imbalance likely due to BPPV, exacerbated by prolonged bed rest. BPPV more likely than vertiginous migraine. - Discussed BPPV's benign and positional nature. - Advised on recurrence potential and Epley maneuver. - Emphasized regular movement to prevent symptoms. - Recommend vestibular therapy for balance training. - Advise setting an alarm to get up hourly. - Instruct to call for same-day evaluation if severe dizziness occurs.  Chronic dry and itchy ears Intermittent dry and itchy ears, possibly due to hearing aids and cleaning habits. No significant earwax buildup. - Discussed use of moisturizing ear drops. - Prescribe moisturizing ear drops with mild steroid, twice daily for five days, then as needed.  Subjective hearing loss Reports of subjective hearing loss with normal recent hearing test. Sensitivity to loud noises noted. - Schedule follow-up hearing test in one year.  - Orders placed:  Orders Placed This Encounter  Procedures   Ambulatory referral to Physical Therapy   - Medications prescribed/continued/adjusted:  Meds ordered this encounter  Medications   Fluocinolone Acetonide 0.01 % OIL    Sig: Place 5 drops in ear(s) 2 (two) times daily for 5 days.    Dispense:  20 mL    Refill:  0   - Education materials provided to the patient. - Follow up: Return if no better with therapy. If symptoms improved, f/u in one year. Patient instructed to return sooner or go to the ED if new/worsening symptoms develop.  Thank you for allowing me the opportunity to care for your patient. Please do not hesitate to contact  me should you have any other questions.  Sincerely, Penne Croak, DO Otolaryngologist (ENT) [facility] Phone: [phone] Fax: [phone]  11/02/2023, 7:16 PM

## 2023-11-03 ENCOUNTER — Other Ambulatory Visit (HOSPITAL_BASED_OUTPATIENT_CLINIC_OR_DEPARTMENT_OTHER): Payer: Self-pay

## 2023-11-03 NOTE — Progress Notes (Addendum)
  [address] [place], [place] [address] [phone]  Audiological Evaluation    Name: [name]     DOB:   [birth year]      MRN:   [id number]                                                                                     Service Date: 11/03/2023     Accompanied by: unaccompanied to the booth   Patient comes today after Dr. Anice, ENT sent a referral for a hearing evaluation due to concerns with tinnitus.   Symptoms Yes Details  Hearing loss  [x]  Reports struggles to hear when in background noise.  Tinnitus  [x]  Both ears - reports one of them is constant but noticed only when it is quest and there is another very loud beep which alternates ears that lasts a minute and then goes away. This second type of tinnitus may happen when she has a headache.  Ear pain/ infections/pressure  []    Balance problems  [x]  Reports motion sickness  Noise exposure history  []    Previous ear surgeries  []    Family history of hearing loss  []    Amplification  []    Other  []      Otoscopy: Right ear: Clear external ear canal and notable landmarks visualized on the tympanic membrane. Left ear:  Clear external ear canal and notable landmarks visualized on the tympanic membrane.  Tympanometry:  Right ear: Normal external ear canal volume with normal middle ear pressure and tympanic membrane compliance (Type A). Findings are suggestive of normal middle ear function.  Left ear: Normal external ear canal volume with normal middle ear pressure and tympanic membrane compliance (Type A). Findings are suggestive of normal middle ear function.    Hearing Evaluation The hearing test results were completed under headphones and results are deemed to be of good reliability. Test technique:  conventional    Pure tone Audiometry: Both ears: Normal hearing from 125 Hz - 8000 Hz.  Speech Audiometry: Right ear- Speech Reception Threshold (SRT) was obtained at 15 dBHL. Left ear-Speech  Reception Threshold (SRT) was obtained at 10 dBHL.   Word Recognition Score Tested using NU-6 (MLV) Right ear: 100% was obtained at a presentation level of 50 dBHL with contralateral masking which is deemed as  excellent. Left ear: 100% was obtained at a presentation level of 50 dBHL with contralateral masking which is deemed as  excellent.   Impression: There is not a significant difference in pure-tone thresholds between ears. There is not a significant difference in the word recognition score in between ears.    Recommendations: Follow up with ENT as scheduled for today. Return for a hearing evaluation if concerns with hearing changes arise or per MD recommendation. Consider various tinnitus strategies, including the use of a sound generator, hearing aids, and/or tinnitus retraining therapy.    Arvine Clayburn MARIE LEROUX-MARTINEZ, AUD

## 2023-11-07 DIAGNOSIS — F341 Dysthymic disorder: Secondary | ICD-10-CM | POA: Diagnosis not present

## 2023-11-07 DIAGNOSIS — F5101 Primary insomnia: Secondary | ICD-10-CM | POA: Diagnosis not present

## 2023-11-07 DIAGNOSIS — F411 Generalized anxiety disorder: Secondary | ICD-10-CM | POA: Diagnosis not present

## 2023-11-09 ENCOUNTER — Encounter: Payer: Self-pay | Admitting: Audiology

## 2023-11-14 ENCOUNTER — Other Ambulatory Visit (HOSPITAL_BASED_OUTPATIENT_CLINIC_OR_DEPARTMENT_OTHER): Payer: Self-pay

## 2023-11-14 DIAGNOSIS — F5101 Primary insomnia: Secondary | ICD-10-CM | POA: Diagnosis not present

## 2023-11-14 DIAGNOSIS — F341 Dysthymic disorder: Secondary | ICD-10-CM | POA: Diagnosis not present

## 2023-11-14 DIAGNOSIS — F4312 Post-traumatic stress disorder, chronic: Secondary | ICD-10-CM | POA: Diagnosis not present

## 2023-11-14 DIAGNOSIS — F411 Generalized anxiety disorder: Secondary | ICD-10-CM | POA: Diagnosis not present

## 2023-11-15 ENCOUNTER — Other Ambulatory Visit (HOSPITAL_BASED_OUTPATIENT_CLINIC_OR_DEPARTMENT_OTHER): Payer: Self-pay

## 2023-11-15 ENCOUNTER — Other Ambulatory Visit: Payer: Self-pay

## 2023-11-16 ENCOUNTER — Ambulatory Visit: Payer: Self-pay

## 2023-11-16 ENCOUNTER — Telehealth: Payer: Self-pay | Admitting: Physician Assistant

## 2023-11-16 ENCOUNTER — Telehealth: Payer: Self-pay | Admitting: Internal Medicine

## 2023-11-16 NOTE — Telephone Encounter (Signed)
 Pt needs to be seen. Please contact pt and schedule an appt.

## 2023-11-16 NOTE — Telephone Encounter (Signed)
 Noted

## 2023-11-16 NOTE — Telephone Encounter (Signed)
 FYI Only or Action Required?: Action required by provider: request for appointment and clinical question for provider.  Patient was last seen in primary care on 09/05/2023 by Job Lukes, PA.  Called Nurse Triage reporting Dizziness and Fall.  Symptoms began today.  Interventions attempted: Nothing.  Symptoms are: unchanged.  Triage Disposition: No disposition on file.  Patient/caregiver understands and will follow disposition?:    Copied from CRM [id number]. Topic: Clinical - Red Word Triage >> Nov 16, 2023  9:57 AM Revonda D wrote: Red Word that prompted transfer to Nurse Triage: dizziness, falls, worse  Pt stated that she is experiencing dizziness and has experienced some falls over the weekend. Pt stated that its a condition she has been dealing with but it recently has gotten worse. Pt would like to schedule an appt with provider.    ----------------------------------------------------------------------- From previous Reason for Contact - Scheduling: Patient/patient representative is calling to schedule an appointment. Refer to attachments for appointment information. Answer Assessment - Initial Assessment Questions 1. DESCRIPTION: Describe your dizziness.     Chronic dizziness and falling   Patient reports been having dizziness and falling, just becoming more frequent.  Patient declines triage.  Patient requesting an appointment and call back.  Answer Assessment - Initial Assessment Questions 1. REASON FOR CALL: What is the main reason for your call? or How can I best help you?  Patient reports been having dizziness and falling, just becoming more frequent.   Patient declines triage.  Patient requesting an appointment and call back.  Protocols used: Dizziness - Lightheadedness-A-AH, Information Only Call - No Triage-A-AH

## 2023-11-16 NOTE — Telephone Encounter (Signed)
  STAT if patient feels like he/she is going to faint   Are you dizzy, lightheaded, or faint now? Yes, dizzy  Have you passed out? No   Do you have any other symptoms? Falling, last fall was 2 hours ago, sometimes she gets sweaty, dizziness is more frequent  Have you checked your HR and BP (record if available)? Hasn't checked it but feels that her heart rate is a little high, is 100 right now.

## 2023-11-16 NOTE — Telephone Encounter (Signed)
 Spoke with the patient who states that she has been having increased dizziness over the past couple of weeks. She states that she has fallen several times. She has not had any syncope. Her symptoms are not always associated with position changes. She states that she is drinking at least 64 ounces of fluids per day. She reports that she she is trying to get in 8 grams of salt per day but does not think she is getting enough. She states that she is unable to exercise. She has compression hose but has not worn them. Advised patient to use compression hose and to increase salt intake. She has spoken with her PCP already today and is seeing them next week in regards to her symptoms. She states that they are doing blood work and would like to know if there is anything specific that we would like to have checked that could be added to those labs so that she doesn't have to get stuck twice.

## 2023-11-16 NOTE — Telephone Encounter (Signed)
 Called pt and got scheduled for 11/22/23 @ 2pm w/ PCP for dizzness that she was triaged for. I offered pt and earlier appt with another provider today or w/ PCP tomorrow but pt declined stating she would be unavailable. Patient stated she felt she was managing well and has been seen for this recently.

## 2023-11-21 DIAGNOSIS — F4312 Post-traumatic stress disorder, chronic: Secondary | ICD-10-CM | POA: Diagnosis not present

## 2023-11-21 DIAGNOSIS — F341 Dysthymic disorder: Secondary | ICD-10-CM | POA: Diagnosis not present

## 2023-11-21 DIAGNOSIS — F5101 Primary insomnia: Secondary | ICD-10-CM | POA: Diagnosis not present

## 2023-11-21 DIAGNOSIS — F411 Generalized anxiety disorder: Secondary | ICD-10-CM | POA: Diagnosis not present

## 2023-11-22 ENCOUNTER — Ambulatory Visit: Admitting: Physician Assistant

## 2023-11-23 ENCOUNTER — Encounter: Payer: Self-pay | Admitting: Physician Assistant

## 2023-11-23 ENCOUNTER — Ambulatory Visit (INDEPENDENT_AMBULATORY_CARE_PROVIDER_SITE_OTHER): Admitting: Physician Assistant

## 2023-11-23 VITALS — BP 120/76 | HR 88 | Temp 97.9°F | Ht 62.0 in | Wt 172.4 lb

## 2023-11-23 DIAGNOSIS — E559 Vitamin D deficiency, unspecified: Secondary | ICD-10-CM

## 2023-11-23 DIAGNOSIS — N939 Abnormal uterine and vaginal bleeding, unspecified: Secondary | ICD-10-CM

## 2023-11-23 DIAGNOSIS — E782 Mixed hyperlipidemia: Secondary | ICD-10-CM | POA: Diagnosis not present

## 2023-11-23 DIAGNOSIS — R42 Dizziness and giddiness: Secondary | ICD-10-CM | POA: Diagnosis not present

## 2023-11-23 DIAGNOSIS — R5383 Other fatigue: Secondary | ICD-10-CM | POA: Diagnosis not present

## 2023-11-23 LAB — COMPREHENSIVE METABOLIC PANEL WITH GFR
ALT: 19 U/L (ref 0–35)
AST: 14 U/L (ref 0–37)
Albumin: 4.7 g/dL (ref 3.5–5.2)
Alkaline Phosphatase: 79 U/L (ref 39–117)
BUN: 11 mg/dL (ref 6–23)
CO2: 30 meq/L (ref 19–32)
Calcium: 9.8 mg/dL (ref 8.4–10.5)
Chloride: 102 meq/L (ref 96–112)
Creatinine, Ser: 0.69 mg/dL (ref 0.40–1.20)
GFR: 114.88 mL/min (ref >60.00)
Glucose, Bld: 79 mg/dL (ref 70–99)
Potassium: 4.5 meq/L (ref 3.5–5.1)
Sodium: 141 meq/L (ref 135–145)
Total Bilirubin: 0.5 mg/dL (ref 0.2–1.2)
Total Protein: 7.3 g/dL (ref 6.0–8.3)

## 2023-11-23 LAB — SEDIMENTATION RATE: Sed Rate: 14 mm/h (ref 0–20)

## 2023-11-23 LAB — CBC WITH DIFFERENTIAL/PLATELET
Basophils Absolute: 0 K/uL (ref 0.0–0.1)
Basophils Relative: 0.5 % (ref 0.0–3.0)
Eosinophils Absolute: 0 K/uL (ref 0.0–0.7)
Eosinophils Relative: 0.7 % (ref 0.0–5.0)
HCT: 39.5 % (ref 36.0–46.0)
Hemoglobin: 13.5 g/dL (ref 12.0–15.0)
Lymphocytes Relative: 32.3 % (ref 12.0–46.0)
Lymphs Abs: 2.4 K/uL (ref 0.7–4.0)
MCHC: 34.1 g/dL (ref 30.0–36.0)
MCV: 90.6 fl (ref 78.0–100.0)
Monocytes Absolute: 0.4 K/uL (ref 0.1–1.0)
Monocytes Relative: 5.2 % (ref 3.0–12.0)
Neutro Abs: 4.6 K/uL (ref 1.4–7.7)
Neutrophils Relative %: 61.3 % (ref 43.0–77.0)
Platelets: 359 K/uL (ref 150.0–400.0)
RBC: 4.36 Mil/uL (ref 3.87–5.11)
RDW: 12.9 % (ref 11.5–15.5)
WBC: 7.5 K/uL (ref 4.0–10.5)

## 2023-11-23 LAB — C-REACTIVE PROTEIN: CRP: <0.5 mg/dL (ref 0.5–20.0)

## 2023-11-23 LAB — LIPID PANEL
Cholesterol: 202 mg/dL — ABNORMAL HIGH (ref 0–200)
HDL: 49.8 mg/dL (ref >39.00)
LDL Cholesterol: 123 mg/dL — ABNORMAL HIGH (ref 0–99)
NonHDL: 152.55
Total CHOL/HDL Ratio: 4
Triglycerides: 147 mg/dL (ref 0.0–149.0)
VLDL: 29.4 mg/dL (ref 0.0–40.0)

## 2023-11-23 LAB — FOLLICLE STIMULATING HORMONE: FSH: 4.9 m[IU]/mL

## 2023-11-23 LAB — VITAMIN D 25 HYDROXY (VIT D DEFICIENCY, FRACTURES): VITD: 17.13 ng/mL — ABNORMAL LOW (ref 30.00–100.00)

## 2023-11-23 LAB — B12 AND FOLATE PANEL
Folate: 22 ng/mL (ref >5.9)
Vitamin B-12: 686 pg/mL (ref 211–911)

## 2023-11-23 LAB — IBC + FERRITIN
Ferritin: 37.7 ng/mL (ref 10.0–291.0)
Iron: 97 ug/dL (ref 42–145)
Saturation Ratios: 30 % (ref 20.0–50.0)
TIBC: 323.4 ug/dL (ref 250.0–450.0)
Transferrin: 231 mg/dL (ref 212.0–360.0)

## 2023-11-23 LAB — TSH: TSH: 1.7 u[IU]/mL (ref 0.35–5.50)

## 2023-11-23 LAB — LUTEINIZING HORMONE: LH: 6.97 m[IU]/mL

## 2023-11-23 LAB — HEMOGLOBIN A1C: Hgb A1c MFr Bld: 5.6 % (ref 4.6–6.5)

## 2023-11-23 NOTE — Progress Notes (Signed)
 [name] is a 33 y.o. female here for a follow up of a pre-existing problem.  History of Present Illness:   Chief Complaint  Patient presents with   Dizziness    Pt still c/o dizziness, saw ENT on 10/22 and referral was placed for Vestibular Rehab. Pt has not scheduled yet.   Mardel continues to deal with dizziness and lightheadedness Reports that symptom(s) are different and both issues are still occurring We discussed this back in August and she wanted referral to ENT She has previously declined vestibular rehab Saw ENT in Oct and was recommended to see vestibular rehab -- referral placed and it appears that they have left her voicemails to schedule  She has had ongoing, unrelenting fatigue She has severe needle phobia and is ready today to get blood work She had xanax  prior to appointment and mother is here to drive her  She has also had blood work recommended from her past gynecology provider but has not had this done, she would like this added on today for her abnormal uterine bleeding   She is taking minoxidil  for her hair thinning Her mother reports that patients symptom(s) seemed to really start after this - patient has not tried stopping minoxidil   She has had to unenroll from two semesters Falling at least twice daily due to severe dizziness  Saw neurology in 2018 and was recommended to get MRA (angiogram) and MRI We have completed MRI    Past Medical History:  Diagnosis Date   ADHD (attention deficit hyperactivity disorder)    Allergy    Anxiety    Autonomic nervous system disorder    states she has falling but not syncope due to loss of blood pressure   Depression    Environmental allergies    GERD (gastroesophageal reflux disease)    Myopia of both eyes    Obesity    Syncope and collapse    Vitamin B 12 deficiency    Vitamin D  deficiency      Social History   Tobacco Use   Smoking status: Never    Passive exposure: Never   Smokeless tobacco:  Never  Vaping Use   Vaping status: Never Used  Substance Use Topics   Alcohol use: Not Currently    Comment: rarely, <1 drink per month, typically wine   Drug use: No    No past surgical history on file.  Family History  Problem Relation Age of Onset   Depression Mother    Hyperlipidemia Mother    Sleep apnea Mother    Colon polyps Mother    Bipolar disorder Father    Diabetes Father    Hyperlipidemia Father    Colon polyps Father    Bipolar disorder Brother    Alzheimer's disease Maternal Grandmother    Diabetes Maternal Grandmother    Heart disease Maternal Grandmother    Hyperlipidemia Maternal Grandmother    Hypertension Maternal Grandmother    Alzheimer's disease Maternal Grandfather    Heart disease Maternal Grandfather    Hyperlipidemia Maternal Grandfather    Sleep apnea Paternal Grandmother    Heart disease Paternal Grandmother    Hypertension Paternal Grandmother    Snoring Paternal Grandmother    Heart attack Paternal Grandfather    Colon cancer Paternal Grandfather    Breast cancer Maternal Aunt 40   Bipolar disorder Paternal Aunt    Epilepsy Cousin    Sleep apnea Other        aunts and uncles  Stomach cancer Neg Hx    Esophageal cancer Neg Hx    Pancreatic cancer Neg Hx     Allergies  Allergen Reactions   Pineapple Other (See Comments)    Mouth on burning.   Cymbalta [Duloxetine Hcl] Other (See Comments)    Hypotension   Daytrana  [Methylphenidate ] Rash    Like a sunburn   Duloxetine Other (See Comments)    Fainting    Current Medications:   Current Outpatient Medications:    ALPRAZolam  (XANAX ) 0.5 MG tablet, Take 1 tablet (0.5 mg total) by mouth as needed for anxiety or sleep., Disp: 30 tablet, Rfl: 4   ALPRAZolam  (XANAX ) 0.5 MG tablet, Take 1 tablet (0.5 mg total) by mouth daily as needed., Disp: 30 tablet, Rfl: 1   ascorbic acid (VITAMIN C) 500 MG tablet, Take 500 mg by mouth every other day., Disp: , Rfl:    Cyanocobalamin  (VITAMIN B-12  PO), Take 1 tablet by mouth daily., Disp: , Rfl:    lamoTRIgine  (LAMICTAL ) 200 MG tablet, Take 1 tablet (200 mg total) by mouth daily., Disp: 90 tablet, Rfl: 0   levonorgestrel  (MIRENA , 52 MG,) 20 MCG/DAY IUD, 1 each by Intrauterine route once., Disp: , Rfl:    LORATADINE PO, Take 10 mg by mouth daily. (Patient taking differently: Take 10 mg by mouth as needed.), Disp: , Rfl:    Meclizine  HCl (BONINE PO), Take by mouth daily as needed. , Disp: , Rfl:    midodrine  (PROAMATINE ) 5 MG tablet, Take 1 tablet (5 mg total) by mouth 2 (two) times daily with a meal., Disp: 180 tablet, Rfl: 1   minoxidil  (LONITEN ) 2.5 MG tablet, Take 0.5 tablets (1.25 mg total) by mouth daily., Disp: 30 tablet, Rfl: 2   Multiple Vitamins-Minerals (MULTIVITAL) tablet, Take 1 tablet by mouth daily. Gummy, Disp: , Rfl:    Naproxen Sodium (ALEVE PO), Take 2 tablets by mouth daily as needed (for pain)., Disp: , Rfl:    PARoxetine  (PAXIL ) 40 MG tablet, Take 1 tablet (40 mg total) by mouth daily., Disp: 90 tablet, Rfl: 0   traZODone  (DESYREL ) 150 MG tablet, Take 0.5 tablets (75 mg total) by mouth at bedtime as needed for sleep., Disp: 45 tablet, Rfl: 0   Review of Systems:   Negative unless otherwise specified per HPI.  Vitals:   Vitals:   11/23/23 1324  BP: 120/76  Pulse: 88  Temp: 97.9 F (36.6 C)  TempSrc: Temporal  SpO2: 99%  Weight: 172 lb 6.1 oz (78.2 kg)  Height: 5' 2 (1.575 m)     Body mass index is 31.53 kg/m.  Physical Exam:   Physical Exam Vitals and nursing note reviewed.  Constitutional:      General: She is not in acute distress.    Appearance: She is well-developed. She is not ill-appearing or toxic-appearing.  Cardiovascular:     Rate and Rhythm: Normal rate and regular rhythm.     Pulses: Normal pulses.     Heart sounds: Normal heart sounds, S1 normal and S2 normal.  Pulmonary:     Effort: Pulmonary effort is normal.     Breath sounds: Normal breath sounds.  Skin:    General: Skin is  warm and dry.  Neurological:     Mental Status: She is alert.     GCS: GCS eye subscore is 4. GCS verbal subscore is 5. GCS motor subscore is 6.     Sensory: Sensation is intact.     Comments: Difficult to assess gait  and motor function as she is very unbalanced and had pre-syncope after blood work  Psychiatric:        Speech: Speech normal.        Behavior: Behavior normal. Behavior is cooperative.     Assessment and Plan:   Mixed hyperlipidemia Update lipid panel  Fatigue, unspecified type Chronic issue She is UpToDate on all cancer screenings We will update all blood work and provide recommendations accordingly Continue efforts at healthy lifestyle and stress reduction Next steps based on results and clinical sinus congestion   Vitamin D  deficiency Update and provide recommendations   Abnormal uterine bleeding Update blood work If any abnormalities -- I discussed that I will forward to her gynecologist for further evaluation  Dizziness Has seen ENT recently, note reviewed I recommend that she stop minoxidil   I recommend starting vestibular rehab Update blood work Follow up with cardiology as scheduled next week Will order MRA (angiogram) of head and neck Low threshold to refer back to neurology if persists -- consider tertiary dizziness clinic at [facility] if persists   Lucie Buttner, PA-C

## 2023-11-25 LAB — CYCLIC CITRUL PEPTIDE ANTIBODY, IGG/IGA: Cyclic Citrullin Peptide Ab: 9 U (ref 0–19)

## 2023-11-25 LAB — TESTOSTERONE,FREE AND TOTAL
Testosterone, Free: 2.6 pg/mL (ref 0.0–4.2)
Testosterone: 24 ng/dL (ref 8–60)

## 2023-11-27 NOTE — Progress Notes (Unsigned)
 Electrophysiology Office Note:   Date:  12/01/2023  ID:  [name], DOB May 15, 1990, MRN [id number]  Primary Cardiologist: None Primary Heart Failure: None Electrophysiologist: Danelle Birmingham, MD      History of Present Illness:   [name] is a 33 y.o. female with h/o autonomic dysfunction, obesity, OSA, severe depression, severe needle phobia seen today for routine electrophysiology followup.   Called EP Clinic 11/16/23 with reports of increased dizziness.  Saw her PCP and extensive blood work was collected - Vitamin D  was low, autoimmune panel negative, TSH wnl, iron panel wnl.    Since last being seen in our clinic the patient reports the last 3 weeks have been particularly bad. She has had several falls per day where her legs just give out.  She thinks she may need a different diagnosis as she doesn't have the palpitations and sweating that she used to experience.  She largely has dizziness now.  She states the episodes come on so quick that she has little warning and she will fall. She reports she continues to take midodrine  but is not sure that it is helpful but she doesn't know what it would be like if she didn't take it. She has never passed out / had loss of consciousness.  She states she drinks approximately 64 oz of liquid per day and has increased her salt intake some. She does not wear compressions socks. She does not exercise. She states her symptoms have been so bad that she withdrew from school.  She had a 1/2 glass water with breakfast.   She denies chest pain, palpitations, dyspnea, PND, orthopnea, syncope.   Review of systems complete and found to be negative unless listed in HPI.   EP Information / Studies Reviewed:    EKG is not ordered today. EKG from 04/26/23 reviewed which showed NSR 89 bpm   EKG Interpretation Date/Time:  Thursday December 01 2023 08:27:57 EST Ventricular Rate:  103 PR Interval:  126 QRS Duration:  74 QT Interval:  334 QTC  Calculation: 437 R Axis:   49  Text Interpretation: Sinus tachycardia Confirmed by Aniceto Jarvis ([id number]) on 12/01/2023 8:42:53 AM    Arrhythmia / AAD / Pertinent EP Studies Autonomic dysfunction > tried midodrine  but had HA's   Risk Assessment/Calculations:              Physical Exam:   VS:  BP 98/82 (BP Location: Left Arm, Patient Position: Sitting, Cuff Size: Normal)   Pulse (!) 103   Ht 5' 2 (1.575 m)   Wt 170 lb (77.1 kg)   LMP 11/14/2023 (Approximate)   BMI 31.09 kg/m    Wt Readings from Last 3 Encounters:  12/01/23 170 lb (77.1 kg)  11/23/23 172 lb 6.1 oz (78.2 kg)  11/02/23 170 lb (77.1 kg)     GEN: Well nourished, well developed in no acute distress NECK: No JVD; No carotid bruits CARDIAC: Regular rate and rhythm, no murmurs, rubs, gallops RESPIRATORY:  Clear to auscultation without rales, wheezing or rhonchi  ABDOMEN: Soft, non-tender, non-distended EXTREMITIES:  No edema; No deformity   ASSESSMENT AND PLAN:    Autonomic Dysfunction  Palpitations  Dizziness  Mechanical Falls  -no syncopal events, but frequent falls    -Pt recommended to get at least 8 g of salt intake per day (discussed LMNT), consuming 3 L or more of fluid a day.   -Consistently exercise 30 to 40 minutes at a heart rate of 70% MPHR at least 5  times a week > use a recumbent machines such as a recumbent bicycle and work on lower body resistance training such as recumbent leg press.  -stay upright as much as possible -shift positions, cross/uncross legs as makeshift pumping action  -ensure 8 hours sleep each night  -discussed compression socks, compression pants  -assess cardiac monitor with falls to ensure no rhythm disturbance contributing  -referral to [facility] for assessment.   -referral to Neurology for dizziness, mechanical falls    Follow up with Dr. Waddell / EP APP in 6 weeks     Signed, Daphne Barrack, NP-C, AGACNP-BC [facility] - Electrophysiology   12/01/2023, 10:27 AM

## 2023-11-28 DIAGNOSIS — F5101 Primary insomnia: Secondary | ICD-10-CM | POA: Diagnosis not present

## 2023-11-28 DIAGNOSIS — F4312 Post-traumatic stress disorder, chronic: Secondary | ICD-10-CM | POA: Diagnosis not present

## 2023-11-28 DIAGNOSIS — F411 Generalized anxiety disorder: Secondary | ICD-10-CM | POA: Diagnosis not present

## 2023-11-28 DIAGNOSIS — F341 Dysthymic disorder: Secondary | ICD-10-CM | POA: Diagnosis not present

## 2023-11-29 ENCOUNTER — Other Ambulatory Visit (HOSPITAL_BASED_OUTPATIENT_CLINIC_OR_DEPARTMENT_OTHER): Payer: Self-pay

## 2023-11-29 ENCOUNTER — Ambulatory Visit: Payer: Self-pay | Admitting: Physician Assistant

## 2023-11-29 MED ORDER — VITAMIN D (ERGOCALCIFEROL) 1.25 MG (50000 UNIT) PO CAPS
50000.0000 [IU] | ORAL_CAPSULE | ORAL | 0 refills | Status: AC
  Filled 2023-11-29: qty 12, 84d supply, fill #0

## 2023-12-01 ENCOUNTER — Encounter: Payer: Self-pay | Admitting: Pulmonary Disease

## 2023-12-01 ENCOUNTER — Ambulatory Visit: Attending: Pulmonary Disease | Admitting: Pulmonary Disease

## 2023-12-01 ENCOUNTER — Ambulatory Visit

## 2023-12-01 VITALS — BP 98/82 | HR 103 | Ht 62.0 in | Wt 170.0 lb

## 2023-12-01 DIAGNOSIS — G90A Postural orthostatic tachycardia syndrome (POTS): Secondary | ICD-10-CM | POA: Insufficient documentation

## 2023-12-01 DIAGNOSIS — R42 Dizziness and giddiness: Secondary | ICD-10-CM | POA: Diagnosis not present

## 2023-12-01 DIAGNOSIS — G909 Disorder of the autonomic nervous system, unspecified: Secondary | ICD-10-CM

## 2023-12-01 LAB — ESTRADIOL: Estradiol: <30 pg/mL

## 2023-12-01 LAB — PTH, INTACT AND CALCIUM
Calcium: 9.8 mg/dL (ref 8.6–10.2)
PTH: 25 pg/mL (ref 16–77)

## 2023-12-01 LAB — PROLACTIN: Prolactin: 9.9 ng/mL

## 2023-12-01 LAB — 17-HYDROXYPROGESTERONE: 17-OH-Progesterone, LC/MS/MS: 42 ng/dL

## 2023-12-01 LAB — RHEUMATOID FACTOR: Rheumatoid fact SerPl-aCnc: <10 [IU]/mL (ref <14)

## 2023-12-01 LAB — ANA: Anti Nuclear Antibody (ANA): NEGATIVE

## 2023-12-01 NOTE — Patient Instructions (Addendum)
 Medication Instructions:  NO CHANGES  Lab Work: NONE TO BE DONE TODAY  Testing/Procedures: ZIO XT- Long Term Monitor Instructions  Your physician has requested you wear a ZIO patch monitor for 7 days.  This is a single patch monitor. [employer] supplies one patch monitor per enrollment. Additional stickers are not available. Please do not apply patch if you will be having a Nuclear Stress Test,  Echocardiogram, Cardiac CT, MRI, or Chest Xray during the period you would be wearing the  monitor. The patch cannot be worn during these tests. You cannot remove and re-apply the  ZIO XT patch monitor.  Your ZIO patch monitor will be mailed 3 day USPS to your address on file. It may take 3-5 days  to receive your monitor after you have been enrolled.  Once you have received your monitor, please review the enclosed instructions. Your monitor  has already been registered assigning a specific monitor serial # to you.  Billing and Patient Assistance Program Information  We have supplied [employer] with any of your insurance information on file for billing purposes. [employer] offers a sliding scale Patient Assistance Program for patients that do not have  insurance, or whose insurance does not completely cover the cost of the ZIO monitor.  You must apply for the Patient Assistance Program to qualify for this discounted rate.  To apply, please call [employer] at [phone], select option 4, select option 2, ask to apply for  Patient Assistance Program. Meredeth will ask your household income, and how many people  are in your household. They will quote your out-of-pocket cost based on that information.  [employer] will also be able to set up a 78-month, interest-free payment plan if needed.  Applying the monitor   Shave hair from upper left chest.  Hold abrader disc by orange tab. Rub abrader in 40 strokes over the upper left chest as  indicated in your monitor instructions.  Clean area with 4 enclosed  alcohol pads. Let dry.  Apply patch as indicated in monitor instructions. Patch will be placed under collarbone on left  side of chest with arrow pointing upward.  Rub patch adhesive wings for 2 minutes. Remove white label marked 1. Remove the white  label marked 2. Rub patch adhesive wings for 2 additional minutes.  While looking in a mirror, press and release button in center of patch. A small green light will  flash 3-4 times. This will be your only indicator that the monitor has been turned on.  Do not shower for the first 24 hours. You may shower after the first 24 hours.  Press the button if you feel a symptom. You will hear a small click. Record Date, Time and  Symptom in the Patient Logbook.  When you are ready to remove the patch, follow instructions on the last 2 pages of Patient  Logbook. Stick patch monitor onto the last page of Patient Logbook.  Place Patient Logbook in the blue and white box. Use locking tab on box and tape box closed  securely. The blue and white box has prepaid postage on it. Please place it in the mailbox as  soon as possible. Your physician should have your test results approximately 7 days after the  monitor has been mailed back to [facility].  Call [facility] Customer Care at [phone] if you have questions regarding  your ZIO XT patch monitor. Call them immediately if you see an orange light blinking on your  monitor.  If your monitor falls  off in less than 4 days, contact our Monitor department at [phone].  If your monitor becomes loose or falls off after 4 days call [employer] at [phone] for  suggestions on securing your monitor   Follow-Up: At [facility], you and your health needs are our priority.  As part of our continuing mission to provide you with exceptional heart care, our providers are all part of one team.  This team includes your primary Cardiologist (physician) and Advanced Practice Providers or APPs  (Physician Assistants and Nurse Practitioners) who all work together to provide you with the care you need, when you need it.  Your next appointment:   6 WEEKS  Provider:   DR. WADDELL, MD OR DAPHNE BARRACK, NP   Other Instructions: REFERRAL TO NEUROLOGY, [facility], AND [facility] HAS BEEN PUT IN AND SOMEONE FROM THEIR OFFICES WILL CONTACT YOU TO GET YOU SCHEDULED WITH THEM.

## 2023-12-01 NOTE — Progress Notes (Unsigned)
 Enrolled patient for a 7 day Zio XT monitor to be mailed to patients home   [name] to read

## 2023-12-02 DIAGNOSIS — F411 Generalized anxiety disorder: Secondary | ICD-10-CM | POA: Diagnosis not present

## 2023-12-02 DIAGNOSIS — F4312 Post-traumatic stress disorder, chronic: Secondary | ICD-10-CM | POA: Diagnosis not present

## 2023-12-02 DIAGNOSIS — F341 Dysthymic disorder: Secondary | ICD-10-CM | POA: Diagnosis not present

## 2023-12-02 DIAGNOSIS — F5101 Primary insomnia: Secondary | ICD-10-CM | POA: Diagnosis not present

## 2023-12-05 ENCOUNTER — Other Ambulatory Visit: Payer: Self-pay

## 2023-12-05 ENCOUNTER — Encounter: Payer: Self-pay | Admitting: Physical Therapy

## 2023-12-05 ENCOUNTER — Ambulatory Visit: Admitting: Physical Therapy

## 2023-12-05 DIAGNOSIS — R42 Dizziness and giddiness: Secondary | ICD-10-CM | POA: Insufficient documentation

## 2023-12-05 DIAGNOSIS — R293 Abnormal posture: Secondary | ICD-10-CM | POA: Diagnosis present

## 2023-12-05 DIAGNOSIS — Z9181 History of falling: Secondary | ICD-10-CM | POA: Insufficient documentation

## 2023-12-05 NOTE — Therapy (Signed)
 OUTPATIENT PHYSICAL THERAPY VESTIBULAR EVALUATION     Patient Name: [name] MRN: [id number] DOB:12-06-1990, 33 y.o., female Today's Date: 12/05/2023  END OF SESSION:  PT End of Session - 12/05/23 1527     Visit Number 1    Number of Visits 9    Date for Recertification  02/03/24    Authorization Type [place] Medicaid/[employer]-auth submitted    PT Start Time 1530    PT Stop Time 1620    PT Time Calculation (min) 50 min    Activity Tolerance Patient tolerated treatment well    Behavior During Therapy WFL for tasks assessed/performed          Past Medical History:  Diagnosis Date   ADHD (attention deficit hyperactivity disorder)    Allergy    Anxiety    Autonomic nervous system disorder    states she has falling but not syncope due to loss of blood pressure   Depression    Environmental allergies    GERD (gastroesophageal reflux disease)    Myopia of both eyes    Obesity    Syncope and collapse    Vitamin B 12 deficiency    Vitamin D  deficiency    History reviewed. No pertinent surgical history. 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  Not necessarily balance issues, but my legs just feel like I will give way.  Awaiting for POTS diagnosis,  but long waiting list to see a specialist.  Started around 2017, progressively worse in past month.  Had to drop out of school due to difficulty concentrating and trying to hold conversation. Pt accompanied by: self  PERTINENT HISTORY: autonomic nervous system disorder; tinnitus; hx of migraines (thinks related to Midodrine ); ADHD  PAIN:  Are you having pain? No  PRECAUTIONS: Fall  RED FLAGS: None   WEIGHT BEARING RESTRICTIONS: No  FALLS: Has patient fallen in last 6 months? A lot-typically at least 2 falls per day-knees collapse directly under  LIVING ENVIRONMENT: Lives with: lives with their family Lives in: House/apartment Stairs: 3 steps to enter home Has following equipment at home: None  PLOF: Independent and Leisure: enjoys shopping, cannot exercise,drive, go anywhere *Looking at stationary bike for home  PATIENT GOALS: Honestly don't know  OBJECTIVE:  Note: Objective measures were completed at Evaluation unless otherwise noted.  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  COGNITION: Overall cognitive status: Within functional limits for tasks assessed   SENSATION: Not testedNo reports of numbness or hx of neuropathy  POSTURE:  rounded shoulders and forward head  Cervical ROM:    Active A/PROM (deg) eval  Flexion 30  Extension 30  Right lateral flexion   Left lateral flexion   Right rotation 70  Left rotation 70  (  Blank rows = not tested)  BED MOBILITY:  Independent  TRANSFERS: Assistive device utilized: None  Sit to stand: Modified independence Stand to sit: Modified independence  GAIT: Gait pattern: slowed, guarded due to occasional LOB from sudden dizziness and falls and step through pattern Distance walked: 25 ft Assistive device utilized: None Level of assistance: SBA   PATIENT SURVEYS:  DHI: THE DIZZINESS HANDICAP INVENTORY (DHI)  P1. Does looking up  increase your problem? 2 = Sometimes  E2. Because of your problem, do you feel frustrated? 4 = Yes  F3. Because of your problem, do you restrict your travel for business or recreation?  4 = Yes  P4. Does walking down the aisle of a supermarket increase your problems?  2 = Sometimes  F5. Because of your problem, do you have difficulty getting into or out of bed?  4 = Yes  F6. Does your problem significantly restrict your participation in social activities, such as going out to dinner, going to the movies, dancing, or going to parties? 4 = Yes  F7. Because of your problem, do you have difficulty reading?  0 = No  P8. Does performing more ambitious activities such as sports, dancing, household chores (sweeping or putting dishes away) increase your problems?  4 = Yes  E9. Because of your problem, are you afraid to leave your home without having without having someone accompany you?  4 = Yes  E10. Because of your problem have you been embarrassed in front of others?  2 = Sometimes  P11. Do quick movements of your head increase your problem?  2 = Sometimes  F12. Because of your problem, do you avoid heights?  4 = Yes  P13. Does turning over in bed increase your problem?  0 = No  F14. Because of your problem, is it difficult for you to do strenuous homework or yard work? 4 = Yes  E15. Because of your problem, are you afraid people may think you are intoxicated? 0 = No  F16. Because of your problem, is it difficult for you to go for a walk by yourself?  4 = Yes  P17. Does walking down a sidewalk increase your problem?  0 = No  E18.Because of your problem, is it difficult for you to concentrate 2 = Sometimes  F19. Because of your problem, is it difficult for you to walk around your house in the dark? 0 = No  E20. Because of your problem, are you afraid to stay home alone?  0 = No  E21. Because of your problem, do you feel handicapped? 4 = Yes  E22. Has the problem placed stress on your relationships with  members of your family or friends? 0 = No  E23. Because of your problem, are you depressed?  4 = Yes  F24. Does your problem interfere with your job or household responsibilities?  4 = Yes  P25. Does bending over increase your problem?  4 = Yes  TOTAL 64    DHI Scoring Instructions  The patient is asked to answer each question as it pertains to dizziness or unsteadiness problems, specifically  considering their condition during the last month. Questions are designed to incorporate functional (F), physical  (P), and emotional (E) impacts on disability.   Scores greater than 10 points should be referred to balance specialists for further evaluation.   16-34 Points (mild handicap)  36-52 Points (moderate handicap)  54+ Points (severe handicap)  Minimally Detectable Change: 17 points [place] &  Ethyl, 1990)  [place], JUDITHANN GLADE and [place], C. W. (1990). The development of the Dizziness Handicap Inventory. Archives of Otolaryngology - Head and Neck Surgery 116(4): [id number].   VESTIBULAR ASSESSMENT:  GENERAL OBSERVATION: wears glasses for distance   SYMPTOM BEHAVIOR:  Subjective history: Reports 2 separate kinds of dizziness:  1-room spinning and ear is off and 2-couple seconds of unsteady and like she may fall.  Has hx of migraines, hx of motion sickness; no head injury.  Notes that stress tends to make symptoms worse.  Non-Vestibular symptoms: tinnitus and nausea/vomiting  Type of dizziness: Spinning/Vertigo, Unsteady with head/body turns, Lightheadedness/Faint, Funny feeling in the head, and World moves  Frequency: 2-3x/wk  Duration: minutes/half-an-hour   Aggravating factors: Nothing-sometimes tilting head, but not always  Relieving factors: Meclizine   Progression of symptoms: worse  OCULOMOTOR EXAM:  Ocular Alignment: normal  Ocular ROM: No Limitations  Spontaneous Nystagmus: absent  Gaze-Induced Nystagmus: absent  Smooth Pursuits: intact  Saccades:  intact  Convergence/Divergence: 0 cm   Cover-cross-cover test: NT   VESTIBULAR - OCULAR REFLEX:   Slow VOR: Comment: 2/10 with horizontal, 3/10 with vertical   VOR Cancellation: Comment: increased symtpoms  Head-Impulse Test: HIT Right: negative but feels dizzy HIT Left: negative  Dynamic Visual Acuity: NT   POSITIONAL TESTING: Right Dix-Hallpike: no nystagmus and dizzy upon coming up to sit Left Dix-Hallpike: no nystagmus and dizzy upon coming up to sit Right Roll Test: no nystagmus Left Roll Test: no nystagmus  Vitals:  105/77, HR 84 (sitting)  MOTION SENSITIVITY: *Reports sitting to standing is the worst*  Motion Sensitivity Quotient Intensity: 0 = none, 1 = Lightheaded, 2 = Mild, 3 = Moderate, 4 = Severe, 5 = Vomiting  Intensity  1. Sitting to supine 0  2. Supine to L side 0  3. Supine to R side 0  4. Supine to sitting 1  5. L Hallpike-Dix 0  6. Up from L  1  7. R Hallpike-Dix 0  8. Up from R  1  9. Sitting, head tipped to L knee 0  10. Head up from L knee 1  11. Sitting, head tipped to R knee 0  12. Head up from R knee 2  13. Sitting head turns x5 1  14.Sitting head nods x5 1-2  15. In stance, 180 turn to L  NT  16. In stance, 180 turn to R NT    OTHOSTATICS: not done                                                                                                                               TREATMENT DATE: 12/05/2023    PATIENT EDUCATION: Education details: Eval results, POC, educated in lower extremity exercises to help ease dizziness with transitions (ankle pumps, LAQ, marching) along with slowed transitional movements Person educated: Patient Education method: Explanation Education comprehension: verbalized understanding  HOME EXERCISE PROGRAM:  GOALS: Goals reviewed with patient? Yes  SHORT TERM  GOALS: Target date: 01/06/2024  Pt will be independent with HEP for improved dizziness, decreased falls. Baseline: DHI 64, reports at least 2  falls/day Goal status: INITIAL  2.  DHI score to improved to less than or equal to 46 for decreased dizziness impacting ADLs. Baseline: 64 Goal status: INITIAL  3.  Pt will verbalize understanding of fall prevention in home environment.  Baseline:  2 falls per day (quick dizzy onset and legs give way)  Goal status:  INITIAL   LONG TERM GOALS: Target date: 02/03/2024  Pt will be independent with HEP for improved dizziness, decreased falls. Baseline: reports 2 falls/day Goal status: INITIAL  2.  Pt will improve DHI score to less than or equal to 28 for decreased dizziness impacting ADLs. Baseline: 64 Goal status: INITIAL  3.  Pt will improve DGI score to at least 20/24 to decrease fall risk.  Baseline: TBD Goal status: INITIAL  4.  Pt will verbalize/demo ability to return to 15-30 minutes of aerobic activity to improve overall functional mobility. Baseline: no current HEP, stays in bed a good portion of day Goal status: INITIAL  ASSESSMENT:  CLINICAL IMPRESSION: Patient is a 33 y.o. female who was seen today for physical therapy evaluation and treatment for dizziness. She has hx of dizziness symptoms since 2017, but reports they have worsened in the past month.  She describes two types of dizziness:  1) spinning type dizziness where world spins, but she denies this is brought on by any specific motion or position; 2) quick onset dizziness that will cause her knees to buckle and fall.  She notes typically 2 falls per day.  She has hx of migraines, hx of motion sickness, tinnitus, work up for POTS, though she notes it will take some time before she can get into specialist for actual diagnosis.  With eval today, oculomotor testing is WFL; VOR and VOR cancellation brings on symptoms (this is consistent with hx of motion sickness).  HIT is negative bilaterally, but R brings on symptoms.  Positional testing for BPPV is negative today, but she has increased symptoms with coming up from supine  from L and R DH positions.  Other motions on MSQ that increase symptoms are seated nose to knee and seated head motions; did not try 180 turns in standing, as pt's symptoms were to the level of nausea by end of session.  She appears to have some motion sensitivity today and symptoms brought on by VOR/VOR cancellation.  DHI score of 64% indicates severe handicap from dizziness.  Further balance testing may be warranted to look at vestibular system use for balance; did not pursue the autonomic dizziness today, other than to educate on slowed transitions and general lower extremity movements for gentle exercise.     OBJECTIVE IMPAIRMENTS: Abnormal gait, decreased activity tolerance, decreased balance, decreased mobility, difficulty walking, dizziness, and postural dysfunction.   ACTIVITY LIMITATIONS: bending, squatting, reach over head, and locomotion level  PARTICIPATION LIMITATIONS: meal prep, cleaning, driving, shopping, community activity, and school  PERSONAL FACTORS: 3+ comorbidities: see above are also affecting patient's functional outcome.   REHAB POTENTIAL: Good  CLINICAL DECISION MAKING: Stable/uncomplicated  EVALUATION COMPLEXITY: Low   PLAN:  PT FREQUENCY: 1x/week  PT DURATION: 8 weeks plus eval  PLANNED INTERVENTIONS: 97750- Physical Performance Testing, 97110-Therapeutic exercises, 97530- Therapeutic activity, [id number]- Neuromuscular re-education, 97535- Self Care, [id number]- Manual therapy, [id number]- Gait training, [phone]- Canalith repositioning, Patient/Family education, Balance training, and Vestibular training  PLAN FOR NEXT SESSION: Assess orthostatic BP  and look at DGI and MCTSIB if pt is able to tolerate.  Initiate HEP for habituation, along with gentle aerobic activity for exercise tolerance   Daisie Haft W., PT 12/05/2023, 5:20 PM   [facility] at [facility] [address] [place], [place] [address] Phone # [phone] Fax # [phone]  Referring diagnosis:  R42 (ICD-10-CM) - Dizziness and giddiness Treatment diagnosis (if different than referring diagnosis): R42, Z91.81, R29.3 Date Symptoms Began: 2017; with exacerbation 1 month ago; MD referral 11/02/2023 # of Visits requested: 9 Time period for Authorization: 12/05/2023 to 02/03/2024  What was this (referring dx) caused by? []  Surgery []  Fall [x]  Ongoing issue []  Arthritis []  Other: ____________  Laterality: []  Rt []  Lt [x]  Both  Functional Tool & Score: DHI64 (severe disability)   Check all possible CPT codes:     See Planned Interventions listed in the Plan section of the Evaluation.     If [employer]: Choose 10 or less codes  If [employer]: Modalities are not covered  If [employer]: Check allowed ICD code combinations   If [facility] Plan or [employer]: Cognitive training not covered

## 2023-12-06 DIAGNOSIS — F411 Generalized anxiety disorder: Secondary | ICD-10-CM | POA: Diagnosis not present

## 2023-12-06 DIAGNOSIS — F5101 Primary insomnia: Secondary | ICD-10-CM | POA: Diagnosis not present

## 2023-12-06 DIAGNOSIS — F341 Dysthymic disorder: Secondary | ICD-10-CM | POA: Diagnosis not present

## 2023-12-06 DIAGNOSIS — F4312 Post-traumatic stress disorder, chronic: Secondary | ICD-10-CM | POA: Diagnosis not present

## 2023-12-07 NOTE — Therapy (Incomplete)
 OUTPATIENT PHYSICAL THERAPY VESTIBULAR TREATMENT     Patient Name: [name] MRN: [id number] DOB:October 06, 1990, 33 y.o., female Today's Date: 12/07/2023  END OF SESSION:    Past Medical History:  Diagnosis Date   ADHD (attention deficit hyperactivity disorder)    Allergy    Anxiety    Autonomic nervous system disorder    states she has falling but not syncope due to loss of blood pressure   Depression    Environmental allergies    GERD (gastroesophageal reflux disease)    Myopia of both eyes    Obesity    Syncope and collapse    Vitamin B 12 deficiency    Vitamin D  deficiency    No past surgical history on file. Patient Active Problem List   Diagnosis Date Noted   OSA on CPAP 12/01/2022   Abnormal uterine bleeding 08/12/2022   Syncope and collapse 04/27/2022   GERD (gastroesophageal reflux disease) 11/03/2020   Autonomic dysfunction 11/03/2020   Severe needle phobia 11/03/2020   Severe episode of recurrent major depressive disorder (HCC) 02/14/2013   Overweight 01/26/2013    PCP: Job Lukes, PA REFERRING PROVIDER: Anice Riis, DO   REFERRING DIAG: R42 (ICD-10-CM) - Dizziness and giddiness   THERAPY DIAG:  No diagnosis found.  ONSET DATE: 11/02/2023 (MD referral)  Rationale for Evaluation and Treatment: Rehabilitation  SUBJECTIVE:   SUBJECTIVE STATEMENT: Dizzy spells and falling is bringing me here to therapy.  Not necessarily balance issues, but my legs just feel like I will give way.  Awaiting for POTS diagnosis, but long waiting list to see a specialist.  Started around 2017, progressively worse in past month.  Had to drop out of school due to difficulty concentrating and trying to hold conversation. Pt accompanied by: self  PERTINENT HISTORY: autonomic nervous system disorder; tinnitus; hx of migraines (thinks related to Midodrine ); ADHD  PAIN:  Are you having pain? No  PRECAUTIONS: Fall  RED FLAGS: None   WEIGHT BEARING  RESTRICTIONS: No  FALLS: Has patient fallen in last 6 months? A lot-typically at least 2 falls per day-knees collapse directly under  LIVING ENVIRONMENT: Lives with: lives with their family Lives in: House/apartment Stairs: 3 steps to enter home Has following equipment at home: None  PLOF: Independent and Leisure: enjoys shopping, cannot exercise,drive, go anywhere *Looking at stationary bike for home  PATIENT GOALS: Honestly don't know  OBJECTIVE:      TODAY'S TREATMENT: 12/12/23 Activity Comments                             Note: Objective measures were completed at Evaluation unless otherwise noted.  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  COGNITION: Overall cognitive status: Within functional limits for tasks assessed   SENSATION: Not testedNo reports of numbness or hx of neuropathy  POSTURE:  rounded shoulders and forward head  Cervical ROM:    Active A/PROM (deg) eval  Flexion 30  Extension 30  Right lateral flexion   Left lateral flexion   Right rotation 70  Left rotation 70  (Blank rows = not tested)  BED MOBILITY:  Independent  TRANSFERS: Assistive device utilized: None  Sit to stand: Modified independence Stand to sit: Modified independence  GAIT: Gait pattern: slowed, guarded due to occasional LOB from sudden dizziness and falls and step through pattern Distance walked: 25 ft Assistive device utilized: None Level  of assistance: SBA   PATIENT SURVEYS:  DHI: THE DIZZINESS HANDICAP INVENTORY (DHI)  P1. Does looking up increase your problem? 2 = Sometimes  E2. Because of your problem, do you feel frustrated? 4 = Yes  F3. Because of your problem, do you restrict your travel for business or recreation?  4 = Yes  P4. Does walking down the aisle of a supermarket increase your problems?  2 = Sometimes  F5. Because of your problem, do you have  difficulty getting into or out of bed?  4 = Yes  F6. Does your problem significantly restrict your participation in social activities, such as going out to dinner, going to the movies, dancing, or going to parties? 4 = Yes  F7. Because of your problem, do you have difficulty reading?  0 = No  P8. Does performing more ambitious activities such as sports, dancing, household chores (sweeping or putting dishes away) increase your problems?  4 = Yes  E9. Because of your problem, are you afraid to leave your home without having without having someone accompany you?  4 = Yes  E10. Because of your problem have you been embarrassed in front of others?  2 = Sometimes  P11. Do quick movements of your head increase your problem?  2 = Sometimes  F12. Because of your problem, do you avoid heights?  4 = Yes  P13. Does turning over in bed increase your problem?  0 = No  F14. Because of your problem, is it difficult for you to do strenuous homework or yard work? 4 = Yes  E15. Because of your problem, are you afraid people may think you are intoxicated? 0 = No  F16. Because of your problem, is it difficult for you to go for a walk by yourself?  4 = Yes  P17. Does walking down a sidewalk increase your problem?  0 = No  E18.Because of your problem, is it difficult for you to concentrate 2 = Sometimes  F19. Because of your problem, is it difficult for you to walk around your house in the dark? 0 = No  E20. Because of your problem, are you afraid to stay home alone?  0 = No  E21. Because of your problem, do you feel handicapped? 4 = Yes  E22. Has the problem placed stress on your relationships with members of your family or friends? 0 = No  E23. Because of your problem, are you depressed?  4 = Yes  F24. Does your problem interfere with your job or household responsibilities?  4 = Yes  P25. Does bending over increase your problem?  4 = Yes  TOTAL 64    DHI Scoring Instructions  The patient is asked to answer each  question as it pertains to dizziness or unsteadiness problems, specifically  considering their condition during the last month. Questions are designed to incorporate functional (F), physical  (P), and emotional (E) impacts on disability.   Scores greater than 10 points should be referred to balance specialists for further evaluation.   16-34 Points (mild handicap)  36-52 Points (moderate handicap)  54+ Points (severe handicap)  Minimally Detectable Change: 17 points ([address] [place], 1990)  [place], G. SHAUNNA. and [place], C. W. (1990). The development of the Dizziness Handicap Inventory. Archives of Otolaryngology - Head and Neck Surgery 116(4): [id number].   VESTIBULAR ASSESSMENT:  GENERAL OBSERVATION: wears glasses for distance   SYMPTOM BEHAVIOR:  Subjective history: Reports 2 separate kinds of dizziness:  1-room spinning  and ear is off and 2-couple seconds of unsteady and like she may fall.  Has hx of migraines, hx of motion sickness; no head injury.  Notes that stress tends to make symptoms worse.  Non-Vestibular symptoms: tinnitus and nausea/vomiting  Type of dizziness: Spinning/Vertigo, Unsteady with head/body turns, Lightheadedness/Faint, Funny feeling in the head, and World moves  Frequency: 2-3x/wk  Duration: minutes/half-an-hour   Aggravating factors: Nothing-sometimes tilting head, but not always  Relieving factors: Meclizine   Progression of symptoms: worse  OCULOMOTOR EXAM:  Ocular Alignment: normal  Ocular ROM: No Limitations  Spontaneous Nystagmus: absent  Gaze-Induced Nystagmus: absent  Smooth Pursuits: intact  Saccades: intact  Convergence/Divergence: 0 cm   Cover-cross-cover test: NT   VESTIBULAR - OCULAR REFLEX:   Slow VOR: Comment: 2/10 with horizontal, 3/10 with vertical   VOR Cancellation: Comment: increased symtpoms  Head-Impulse Test: HIT Right: negative but feels dizzy HIT Left: negative  Dynamic Visual Acuity: NT   POSITIONAL TESTING: Right  Dix-Hallpike: no nystagmus and dizzy upon coming up to sit Left Dix-Hallpike: no nystagmus and dizzy upon coming up to sit Right Roll Test: no nystagmus Left Roll Test: no nystagmus  Vitals:  105/77, HR 84 (sitting)  MOTION SENSITIVITY: *Reports sitting to standing is the worst*  Motion Sensitivity Quotient Intensity: 0 = none, 1 = Lightheaded, 2 = Mild, 3 = Moderate, 4 = Severe, 5 = Vomiting  Intensity  1. Sitting to supine 0  2. Supine to L side 0  3. Supine to R side 0  4. Supine to sitting 1  5. L Hallpike-Dix 0  6. Up from L  1  7. R Hallpike-Dix 0  8. Up from R  1  9. Sitting, head tipped to L knee 0  10. Head up from L knee 1  11. Sitting, head tipped to R knee 0  12. Head up from R knee 2  13. Sitting head turns x5 1  14.Sitting head nods x5 1-2  15. In stance, 180 turn to L  NT  16. In stance, 180 turn to R NT    OTHOSTATICS: not done                                                                                                                               TREATMENT DATE: 12/05/2023    PATIENT EDUCATION: Education details: Eval results, POC, educated in lower extremity exercises to help ease dizziness with transitions (ankle pumps, LAQ, marching) along with slowed transitional movements Person educated: Patient Education method: Explanation Education comprehension: verbalized understanding  HOME EXERCISE PROGRAM:  GOALS: Goals reviewed with patient? Yes  SHORT TERM GOALS: Target date: 01/06/2024  Pt will be independent with HEP for improved dizziness, decreased falls. Baseline: DHI 64, reports at least 2 falls/day Goal status: IN PROGRESS  2.  DHI score to improved to less than or equal to 46 for decreased dizziness impacting ADLs. Baseline: 64 Goal status: IN PROGRESS  3.  Pt will verbalize understanding of fall prevention in home environment.  Baseline:  2 falls per day (quick dizzy onset and legs give way)  Goal status:  INITIAL   LONG  TERM GOALS: Target date: 02/03/2024  Pt will be independent with HEP for improved dizziness, decreased falls. Baseline: reports 2 falls/day Goal status: IN PROGRESS  2.  Pt will improve DHI score to less than or equal to 28 for decreased dizziness impacting ADLs. Baseline: 64 Goal status: IN PROGRESS  3.  Pt will improve DGI score to at least 20/24 to decrease fall risk.  Baseline: TBD Goal status: IN PROGRESS  4.  Pt will verbalize/demo ability to return to 15-30 minutes of aerobic activity to improve overall functional mobility. Baseline: no current HEP, stays in bed a good portion of day Goal status: IN PROGRESS  ASSESSMENT:  CLINICAL IMPRESSION: Patient is a 33 y.o. female who was seen today for physical therapy evaluation and treatment for dizziness. She has hx of dizziness symptoms since 2017, but reports they have worsened in the past month.  She describes two types of dizziness:  1) spinning type dizziness where world spins, but she denies this is brought on by any specific motion or position; 2) quick onset dizziness that will cause her knees to buckle and fall.  She notes typically 2 falls per day.  She has hx of migraines, hx of motion sickness, tinnitus, work up for POTS, though she notes it will take some time before she can get into specialist for actual diagnosis.  With eval today, oculomotor testing is WFL; VOR and VOR cancellation brings on symptoms (this is consistent with hx of motion sickness).  HIT is negative bilaterally, but R brings on symptoms.  Positional testing for BPPV is negative today, but she has increased symptoms with coming up from supine from L and R DH positions.  Other motions on MSQ that increase symptoms are seated nose to knee and seated head motions; did not try 180 turns in standing, as pt's symptoms were to the level of nausea by end of session.  She appears to have some motion sensitivity today and symptoms brought on by VOR/VOR cancellation.  DHI  score of 64% indicates severe handicap from dizziness.  Further balance testing may be warranted to look at vestibular system use for balance; did not pursue the autonomic dizziness today, other than to educate on slowed transitions and general lower extremity movements for gentle exercise.     OBJECTIVE IMPAIRMENTS: Abnormal gait, decreased activity tolerance, decreased balance, decreased mobility, difficulty walking, dizziness, and postural dysfunction.   ACTIVITY LIMITATIONS: bending, squatting, reach over head, and locomotion level  PARTICIPATION LIMITATIONS: meal prep, cleaning, driving, shopping, community activity, and school  PERSONAL FACTORS: 3+ comorbidities: see above are also affecting patient's functional outcome.   REHAB POTENTIAL: Good  CLINICAL DECISION MAKING: Stable/uncomplicated  EVALUATION COMPLEXITY: Low   PLAN:  PT FREQUENCY: 1x/week  PT DURATION: 8 weeks plus eval  PLANNED INTERVENTIONS: 97750- Physical Performance Testing, 97110-Therapeutic exercises, 97530- Therapeutic activity, [id number]- Neuromuscular re-education, 97535- Self Care, [id number]- Manual therapy, [id number]- Gait training, [phone]- Canalith repositioning, Patient/Family education, Balance training, and Vestibular training  PLAN FOR NEXT SESSION: Assess orthostatic BP and look at DGI and MCTSIB if pt is able to tolerate.  Initiate HEP for habituation, along with gentle aerobic activity for exercise tolerance   Slater MARLA Christians, PT 12/07/2023, 8:40 AM   [facility] [address] [place], [address] [place],  [place] [address] Phone # [phone] Fax # [phone]  Referring diagnosis:  R42 (ICD-10-CM) - Dizziness and giddiness Treatment diagnosis (if different than referring diagnosis): R42, Z91.81, R29.3 Date Symptoms Began: 2017; with exacerbation 1 month ago; MD referral 11/02/2023 # of Visits requested: 9 Time period for Authorization: 12/05/2023 to 02/03/2024  What  was this (referring dx) caused by? []  Surgery []  Fall [x]  Ongoing issue []  Arthritis []  Other: ____________  Laterality: []  Rt []  Lt [x]  Both  Functional Tool & Score: DHI64 (severe disability)   Check all possible CPT codes:     See Planned Interventions listed in the Plan section of the Evaluation.     If [employer]: Choose 10 or less codes  If [employer]: Modalities are not covered  If [employer]: Check allowed ICD code combinations   If [facility] Plan or [employer]: Cognitive training not covered

## 2023-12-12 ENCOUNTER — Ambulatory Visit: Admitting: Physical Therapy

## 2023-12-12 DIAGNOSIS — F341 Dysthymic disorder: Secondary | ICD-10-CM | POA: Diagnosis not present

## 2023-12-12 DIAGNOSIS — F411 Generalized anxiety disorder: Secondary | ICD-10-CM | POA: Diagnosis not present

## 2023-12-12 DIAGNOSIS — F5101 Primary insomnia: Secondary | ICD-10-CM | POA: Diagnosis not present

## 2023-12-12 DIAGNOSIS — F4312 Post-traumatic stress disorder, chronic: Secondary | ICD-10-CM | POA: Diagnosis not present

## 2023-12-17 ENCOUNTER — Ambulatory Visit
Admission: RE | Admit: 2023-12-17 | Discharge: 2023-12-17 | Disposition: A | Source: Ambulatory Visit | Attending: Physician Assistant

## 2023-12-17 DIAGNOSIS — R42 Dizziness and giddiness: Secondary | ICD-10-CM

## 2023-12-19 DIAGNOSIS — F411 Generalized anxiety disorder: Secondary | ICD-10-CM | POA: Diagnosis not present

## 2023-12-19 DIAGNOSIS — F5101 Primary insomnia: Secondary | ICD-10-CM | POA: Diagnosis not present

## 2023-12-19 DIAGNOSIS — F331 Major depressive disorder, recurrent, moderate: Secondary | ICD-10-CM | POA: Diagnosis not present

## 2023-12-20 ENCOUNTER — Ambulatory Visit

## 2023-12-20 DIAGNOSIS — Z9181 History of falling: Secondary | ICD-10-CM | POA: Diagnosis present

## 2023-12-20 DIAGNOSIS — R293 Abnormal posture: Secondary | ICD-10-CM | POA: Diagnosis present

## 2023-12-20 DIAGNOSIS — R42 Dizziness and giddiness: Secondary | ICD-10-CM | POA: Diagnosis present

## 2023-12-20 NOTE — Therapy (Signed)
 OUTPATIENT PHYSICAL THERAPY VESTIBULAR TREATMENT     Patient Name: [name] MRN: [id number] DOB:[birth year], 33 y.o., female Today's Date: 12/20/2023  END OF SESSION:  PT End of Session - 12/20/23 1615     Visit Number 2    Number of Visits 9    Date for Recertification  02/03/24    Authorization Type [place] Medicaid/[employer]-auth submitted    PT Start Time 1615    PT Stop Time 1700    PT Time Calculation (min) 45 min    Activity Tolerance Patient tolerated treatment well    Behavior During Therapy WFL for tasks assessed/performed          Past Medical History:  Diagnosis Date   ADHD (attention deficit hyperactivity disorder)    Allergy    Anxiety    Autonomic nervous system disorder    states she has falling but not syncope due to loss of blood pressure   Depression    Environmental allergies    GERD (gastroesophageal reflux disease)    Myopia of both eyes    Obesity    Syncope and collapse    Vitamin B 12 deficiency    Vitamin D  deficiency    No past surgical history on file. 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  Pt accompanied by: self  PERTINENT HISTORY: autonomic nervous system disorder; tinnitus; hx of migraines (thinks related to  Midodrine ); ADHD  PAIN:  Are you having pain? No  PRECAUTIONS: Fall  RED FLAGS: None   WEIGHT BEARING RESTRICTIONS: No  FALLS: Has patient fallen in last 6 months? A lot-typically at least 2 falls per day-knees collapse directly under  LIVING ENVIRONMENT: Lives with: lives with their family Lives in: House/apartment Stairs: 3 steps to enter home Has following equipment at home: None  PLOF: Independent and Leisure: enjoys shopping, cannot exercise,drive, go anywhere *Looking at stationary bike for home  PATIENT GOALS: Honestly don't know  OBJECTIVE:   TODAY'S TREATMENT: 12/20/23 Activity Comments  Orthostatics:  Supine x 5 min: 108/77, 69 bpm Standing x 1 min: 103/77, 82 Standing x 3 min: 104/81, 86 bpm Dizziness/lightheaded w/ initial change of position  Pt education for exercise with POTS Based on [facility] protocol  Supine there ex -bridges 1x10 PF/DF -SLR 2x10 -sidelying hip abd 2x10             PATIENT EDUCATION: Education details: Eval results, POC, educated in lower extremity exercises to help ease dizziness with transitions (ankle pumps, LAQ, marching) along with slowed transitional movements Person educated: Patient Education method: Explanation Education comprehension: verbalized understanding  HOME EXERCISE PROGRAM: Access Code: [email or web address] URL: [email or web address] Date: 12/20/2023 Prepared by: Burnard Sandifer  Exercises - Supine Bridge  - 2-3 x weekly - 1-3 sets - 10 reps - Bridge on Heels  - 2-3 x weekly -  1-3 sets - 10 reps - Supine Active Straight Leg Raise  - 2-3 x weekly - 3 sets - 10 reps - Sidelying Hip Abduction  - 2-3 x weekly - 3 sets - 10 reps  Note: Objective measures were completed at Evaluation unless otherwise noted.  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  COGNITION: Overall cognitive status: Within  functional limits for tasks assessed   SENSATION: Not testedNo reports of numbness or hx of neuropathy  POSTURE:  rounded shoulders and forward head  Cervical ROM:    Active A/PROM (deg) eval  Flexion 30  Extension 30  Right lateral flexion   Left lateral flexion   Right rotation 70  Left rotation 70  (Blank rows = not tested)  BED MOBILITY:  Independent  TRANSFERS: Assistive device utilized: None  Sit to stand: Modified independence Stand to sit: Modified independence  GAIT: Gait pattern: slowed, guarded due to occasional LOB from sudden dizziness and falls and step through pattern Distance walked: 25 ft Assistive device utilized: None Level of assistance: SBA   PATIENT SURVEYS:  DHI: THE DIZZINESS HANDICAP INVENTORY (DHI)  P1. Does looking up increase your problem? 2 = Sometimes  E2. Because of your problem, do you feel frustrated? 4 = Yes  F3. Because of your problem, do you restrict your travel for business or recreation?  4 = Yes  P4. Does walking down the aisle of a supermarket increase your problems?  2 = Sometimes  F5. Because of your problem, do you have difficulty getting into or out of bed?  4 = Yes  F6. Does your problem significantly restrict your participation in social activities, such as going out to dinner, going to the movies, dancing, or going to parties? 4 = Yes  F7. Because of your problem, do you have difficulty reading?  0 = No  P8. Does performing more ambitious activities such as sports, dancing, household chores (sweeping or putting dishes away) increase your problems?  4 = Yes  E9. Because of your problem, are you afraid to leave your home without having without having someone accompany you?  4 = Yes  E10. Because of your problem have you been embarrassed in front of others?  2 = Sometimes  P11. Do quick movements of your head increase your problem?  2 = Sometimes  F12. Because of your problem, do you avoid heights?  4 = Yes  P13. Does  turning over in bed increase your problem?  0 = No  F14. Because of your problem, is it difficult for you to do strenuous homework or yard work? 4 = Yes  E15. Because of your problem, are you afraid people may think you are intoxicated? 0 = No  F16. Because of your problem, is it difficult for you to go for a walk by yourself?  4 = Yes  P17. Does walking down a sidewalk increase your problem?  0 = No  E18.Because of your problem, is it difficult for you to concentrate 2 = Sometimes  F19. Because of your problem, is it difficult for you to walk around your house in the dark? 0 = No  E20. Because of your problem, are you afraid to stay home alone?  0 = No  E21. Because of your problem, do you feel handicapped? 4 = Yes  E22. Has the problem placed stress on your relationships with members of your family or friends? 0 =  No  E23. Because of your problem, are you depressed?  4 = Yes  F24. Does your problem interfere with your job or household responsibilities?  4 = Yes  P25. Does bending over increase your problem?  4 = Yes  TOTAL 64    DHI Scoring Instructions  The patient is asked to answer each question as it pertains to dizziness or unsteadiness problems, specifically  considering their condition during the last month. Questions are designed to incorporate functional (F), physical  (P), and emotional (E) impacts on disability.   Scores greater than 10 points should be referred to balance specialists for further evaluation.   16-34 Points (mild handicap)  36-52 Points (moderate handicap)  54+ Points (severe handicap)  Minimally Detectable Change: 17 points ([address] [place], 1990)  [place], G. SHAUNNA. and [place], C. W. (1990). The development of the Dizziness Handicap Inventory. Archives of Otolaryngology - Head and Neck Surgery 116(4): [id number].   VESTIBULAR ASSESSMENT:  GENERAL OBSERVATION: wears glasses for distance   SYMPTOM BEHAVIOR:  Subjective history: Reports 2 separate kinds  of dizziness:  1-room spinning and ear is off and 2-couple seconds of unsteady and like she may fall.  Has hx of migraines, hx of motion sickness; no head injury.  Notes that stress tends to make symptoms worse.  Non-Vestibular symptoms: tinnitus and nausea/vomiting  Type of dizziness: Spinning/Vertigo, Unsteady with head/body turns, Lightheadedness/Faint, Funny feeling in the head, and World moves  Frequency: 2-3x/wk  Duration: minutes/half-an-hour   Aggravating factors: Nothing-sometimes tilting head, but not always  Relieving factors: Meclizine   Progression of symptoms: worse  OCULOMOTOR EXAM:  Ocular Alignment: normal  Ocular ROM: No Limitations  Spontaneous Nystagmus: absent  Gaze-Induced Nystagmus: absent  Smooth Pursuits: intact  Saccades: intact  Convergence/Divergence: 0 cm   Cover-cross-cover test: NT   VESTIBULAR - OCULAR REFLEX:   Slow VOR: Comment: 2/10 with horizontal, 3/10 with vertical   VOR Cancellation: Comment: increased symtpoms  Head-Impulse Test: HIT Right: negative but feels dizzy HIT Left: negative  Dynamic Visual Acuity: NT   POSITIONAL TESTING: Right Dix-Hallpike: no nystagmus and dizzy upon coming up to sit Left Dix-Hallpike: no nystagmus and dizzy upon coming up to sit Right Roll Test: no nystagmus Left Roll Test: no nystagmus  Vitals:  105/77, HR 84 (sitting)  MOTION SENSITIVITY: *Reports sitting to standing is the worst*  Motion Sensitivity Quotient Intensity: 0 = none, 1 = Lightheaded, 2 = Mild, 3 = Moderate, 4 = Severe, 5 = Vomiting  Intensity  1. Sitting to supine 0  2. Supine to L side 0  3. Supine to R side 0  4. Supine to sitting 1  5. L Hallpike-Dix 0  6. Up from L  1  7. R Hallpike-Dix 0  8. Up from R  1  9. Sitting, head tipped to L knee 0  10. Head up from L knee 1  11. Sitting, head tipped to R knee 0  12. Head up from R knee 2  13. Sitting head turns x5 1  14.Sitting head nods x5 1-2  15. In stance, 180 turn to L  NT   16. In stance, 180 turn to R NT    OTHOSTATICS: not done  TREATMENT DATE: 12/05/2023      GOALS: Goals reviewed with patient? Yes  SHORT TERM GOALS: Target date: 01/06/2024  Pt will be independent with HEP for improved dizziness, decreased falls. Baseline: DHI 64, reports at least 2 falls/day Goal status: INITIAL  2.  DHI score to improved to less than or equal to 46 for decreased dizziness impacting ADLs. Baseline: 64 Goal status: INITIAL  3.  Pt will verbalize understanding of fall prevention in home environment.  Baseline:  2 falls per day (quick dizzy onset and legs give way)  Goal status:  INITIAL   LONG TERM GOALS: Target date: 02/03/2024  Pt will be independent with HEP for improved dizziness, decreased falls. Baseline: reports 2 falls/day Goal status: INITIAL  2.  Pt will improve DHI score to less than or equal to 28 for decreased dizziness impacting ADLs. Baseline: 64 Goal status: INITIAL  3.  Pt will improve DGI score to at least 20/24 to decrease fall risk.  Baseline: TBD Goal status: INITIAL  4.  Pt will verbalize/demo ability to return to 15-30 minutes of aerobic activity to improve overall functional mobility. Baseline: no current HEP, stays in bed a good portion of day Goal status: INITIAL  ASSESSMENT:  CLINICAL IMPRESSION: Reports ongoing issue of dizziness/lightheadedness with position change (greatest with sit to stand). Orthostatics assessment unrevealing with values staying WNL to baseline and reports symptoms lasting apporx 30 sec with initial arising from supine.  Discussed tenets of exercise in regards to POTS or related symptoms and discussed merits of initiating activity from supine (horizontal) progressing to upright positions and instructed in 4 movements for HEP development to implement as strength training for  endurance on days where she is more symptomatic and bed fast.  Good return demonstration to activities without adverse issues. Continued sessions to advance POC details      From Eval: Patient is a 33 y.o. female who was seen today for physical therapy evaluation and treatment for dizziness. She has hx of dizziness symptoms since 2017, but reports they have worsened in the past month.  She describes two types of dizziness:  1) spinning type dizziness where world spins, but she denies this is brought on by any specific motion or position; 2) quick onset dizziness that will cause her knees to buckle and fall.  She notes typically 2 falls per day.  She has hx of migraines, hx of motion sickness, tinnitus, work up for POTS, though she notes it will take some time before she can get into specialist for actual diagnosis.  With eval today, oculomotor testing is WFL; VOR and VOR cancellation brings on symptoms (this is consistent with hx of motion sickness).  HIT is negative bilaterally, but R brings on symptoms.  Positional testing for BPPV is negative today, but she has increased symptoms with coming up from supine from L and R DH positions.  Other motions on MSQ that increase symptoms are seated nose to knee and seated head motions; did not try 180 turns in standing, as pt's symptoms were to the level of nausea by end of session.  She appears to have some motion sensitivity today and symptoms brought on by VOR/VOR cancellation.  DHI score of 64% indicates severe handicap from dizziness.  Further balance testing may be warranted to look at vestibular system use for balance; did not pursue the autonomic dizziness today, other than to educate on slowed transitions and general lower extremity movements for gentle exercise.     OBJECTIVE IMPAIRMENTS:  Abnormal gait, decreased activity tolerance, decreased balance, decreased mobility, difficulty walking, dizziness, and postural dysfunction.   ACTIVITY LIMITATIONS:  bending, squatting, reach over head, and locomotion level  PARTICIPATION LIMITATIONS: meal prep, cleaning, driving, shopping, community activity, and school  PERSONAL FACTORS: 3+ comorbidities: see above are also affecting patient's functional outcome.   REHAB POTENTIAL: Good  CLINICAL DECISION MAKING: Stable/uncomplicated  EVALUATION COMPLEXITY: Low   PLAN:  PT FREQUENCY: 1x/week  PT DURATION: 8 weeks plus eval  PLANNED INTERVENTIONS: 97750- Physical Performance Testing, 97110-Therapeutic exercises, 97530- Therapeutic activity, [id number]- Neuromuscular re-education, 97535- Self Care, [id number]- Manual therapy, [id number]- Gait training, [phone]- Canalith repositioning, Patient/Family education, Balance training, and Vestibular training  PLAN FOR NEXT SESSION: DGI and MCTSIB if pt is able to tolerate.  Initiate HEP for habituation, along with gentle aerobic activity for exercise tolerance   Jonette MARLA Sandifer, PT 12/20/2023, 4:16 PM   [facility] [address], [address] [place], [place] [address] Phone # [phone] Fax # [phone]  Referring diagnosis:  R42 (ICD-10-CM) - Dizziness and giddiness Treatment diagnosis (if different than referring diagnosis): R42, Z91.81, R29.3 Date Symptoms Began: 2017; with exacerbation 1 month ago; MD referral 11/02/2023 # of Visits requested: 9 Time period for Authorization: 12/05/2023 to 02/03/2024  What was this (referring dx) caused by? []  Surgery []  Fall [x]  Ongoing issue []  Arthritis []  Other: ____________  Laterality: []  Rt []  Lt [x]  Both  Functional Tool & Score: DHI64 (severe disability)   Check all possible CPT codes:     See Planned Interventions listed in the Plan section of the Evaluation.     If [employer]: Choose 10 or less codes  If [employer]: Modalities are not covered  If [employer]: Check allowed ICD code combinations   If [facility] Plan or [employer]: Cognitive  training not covered

## 2023-12-23 DIAGNOSIS — F411 Generalized anxiety disorder: Secondary | ICD-10-CM | POA: Diagnosis not present

## 2023-12-23 DIAGNOSIS — F4312 Post-traumatic stress disorder, chronic: Secondary | ICD-10-CM | POA: Diagnosis not present

## 2023-12-23 DIAGNOSIS — F5101 Primary insomnia: Secondary | ICD-10-CM | POA: Diagnosis not present

## 2023-12-23 DIAGNOSIS — F341 Dysthymic disorder: Secondary | ICD-10-CM | POA: Diagnosis not present

## 2023-12-26 ENCOUNTER — Encounter (INDEPENDENT_AMBULATORY_CARE_PROVIDER_SITE_OTHER): Payer: Self-pay

## 2023-12-26 ENCOUNTER — Ambulatory Visit (INDEPENDENT_AMBULATORY_CARE_PROVIDER_SITE_OTHER)

## 2023-12-26 VITALS — BP 107/71 | HR 77 | Temp 98.0°F | Wt 170.0 lb

## 2023-12-26 DIAGNOSIS — R42 Dizziness and giddiness: Secondary | ICD-10-CM | POA: Diagnosis not present

## 2023-12-26 DIAGNOSIS — H9313 Tinnitus, bilateral: Secondary | ICD-10-CM | POA: Diagnosis not present

## 2023-12-26 DIAGNOSIS — G909 Disorder of the autonomic nervous system, unspecified: Secondary | ICD-10-CM | POA: Diagnosis not present

## 2023-12-28 ENCOUNTER — Ambulatory Visit: Admitting: Physical Therapy

## 2023-12-28 ENCOUNTER — Encounter: Payer: Self-pay | Admitting: Physical Therapy

## 2023-12-28 DIAGNOSIS — R42 Dizziness and giddiness: Secondary | ICD-10-CM

## 2023-12-28 NOTE — Progress Notes (Signed)
 Dear Dr. Job, Here is my assessment for our mutual patient, [name]. Thank you for allowing me the opportunity to care for your patient. Please do not hesitate to contact me should you have any other questions. Sincerely, Dr. Penne Croak  Otolaryngology Clinic Note Referring provider: Dr. Job HPI:  Discussed the use of AI scribe software for clinical note transcription with the patient, who gave verbal consent to proceed.  History of Present Illness [name] is a 33 year old female with chronic vertigo who presents for evaluation of persistent vertigo, dizziness, and tinnitus.  Vertigo and dizziness - Persistent vertigo and dizziness triggered by any head movement or change in head position, including looking up, down, left, or right - Keeping head straight reduces symptoms - Vertigo described as the room tilting - Tilting head up triggers severe vertigo - Symptoms present throughout the day, with a particularly severe episode on the morning of December 26, 2023, lasting over an hour and persisting intermittently all day - Associated lightheadedness and occasional nausea without emesis - Dizziness is sometimes positional, triggered by tilting head, lying down, sitting up, or standing - Able to predict upon waking whether it will be a good or bad day - No recent falls - No hearing loss - Occasional headaches, less frequent than in the past  Tinnitus - Persistent tinnitus - No associated hearing loss  Orthostatic symptoms and blood pressure abnormalities - History of low blood pressure, with recent readings around 101/70 mmHg and typical values of 100/60 mmHg - Feels worse when diastolic blood pressure drops below 60 mmHg - Episodes of near-syncope, including one episode requiring a wheelchair due to presyncope - No falls during a week of ambulatory cardiac monitoring, but dizziness was present during that time - Currently taking midodrine  twice daily, meclizine  for  nausea, and diphenhydramine, which she believes may help raise blood pressure - Regimen includes increased fluid and sodium intake: approximately three liters of water and at least four grams of sodium daily, including electrolyte supplements and high-sodium foods - Referred to a POTS specialist, but significant waiting list and limited access to tilt table testing in her region  Prior diagnostic evaluation and supplementation - MRI and MRA of the brain performed - Less than 20% stenosis in the anterior carotid artery identified - Laboratory studies revealed low vitamin D , currently managed with weekly prescription supplementation - Previously low vitamin B12, now managed with calcium and B12 supplementation every other day  Functional impact - Symptoms have significantly impacted daily life, resulting in inability to drive, perform schoolwork, and requiring withdrawal from current semester    Independent Review of Additional Tests or Records:  Reviewed external note from referring PCP, [place],describing relevant history incorporated into todays evaluation.   PMH/Meds/All/SocHx/FamHx/ROS:   Past Medical History:  Diagnosis Date   ADHD (attention deficit hyperactivity disorder)    Allergy    Anxiety    Autonomic nervous system disorder    states she has falling but not syncope due to loss of blood pressure   Depression    Environmental allergies    GERD (gastroesophageal reflux disease)    Myopia of both eyes    Obesity    Syncope and collapse    Vitamin B 12 deficiency    Vitamin D  deficiency      History reviewed. No pertinent surgical history.  Family History  Problem Relation Age of Onset   Depression Mother    Hyperlipidemia Mother    Sleep apnea Mother  Colon polyps Mother    Bipolar disorder Father    Diabetes Father    Hyperlipidemia Father    Colon polyps Father    Bipolar disorder Brother    Alzheimer's disease Maternal Grandmother    Diabetes Maternal  Grandmother    Heart disease Maternal Grandmother    Hyperlipidemia Maternal Grandmother    Hypertension Maternal Grandmother    Alzheimer's disease Maternal Grandfather    Heart disease Maternal Grandfather    Hyperlipidemia Maternal Grandfather    Sleep apnea Paternal Grandmother    Heart disease Paternal Grandmother    Hypertension Paternal Grandmother    Snoring Paternal Grandmother    Heart attack Paternal Grandfather    Colon cancer Paternal Grandfather    Breast cancer Maternal Aunt 40   Bipolar disorder Paternal Aunt    Epilepsy Cousin    Sleep apnea Other        aunts and uncles   Stomach cancer Neg Hx    Esophageal cancer Neg Hx    Pancreatic cancer Neg Hx      Social Connections: Unknown (11/19/2023)   Social Connection and Isolation Panel    Frequency of Communication with Friends and Family: Patient declined    Frequency of Social Gatherings with Friends and Family: Patient declined    Attends Religious Services: Patient declined    Database Administrator or Organizations: No    Attends Engineer, Structural: Not on file    Marital Status: Patient declined  Recent Concern: Social Connections - Socially Isolated (09/04/2023)   Social Connection and Isolation Panel    Frequency of Communication with Friends and Family: Once a week    Frequency of Social Gatherings with Friends and Family: Once a week    Attends Religious Services: Never    Database Administrator or Organizations: No    Attends Engineer, Structural: Not on file    Marital Status: Never married     Current Medications[1]   Physical Exam:   BP 107/71 (BP Location: Right Arm, Patient Position: Sitting, Cuff Size: Normal)   Pulse 77   Temp 98 F (36.7 C)   Wt 170 lb (77.1 kg)   SpO2 97%   BMI 31.09 kg/m   The patient was awake, alert, and appropriate. The external ears were inspected, and otoscopy was performed to evaluate the external auditory canals and tympanic membranes.  The nasal cavity and septum were examined for mucosal changes, obstruction, or discharge. The oral cavity and oropharynx were inspected for mucosal lesions, infection, or tonsillar hypertrophy. The neck was palpated for lymphadenopathy, thyroid  abnormalities, or other masses. Cranial nerve function was grossly intact.  Pertinent Findings: General: Well developed, well nourished. No acute distress. Voice without hoarseness Head/Face: Normocephalic. No sinus tenderness. Facial nerve intact and equal bilaterally. No facial lacerations. Eyes: PERRL, no scleral icterus or conjunctival hemorrhage. EOMI. Ears: No gross deformity. Normal external canal. Tympanic membrane with normal landmarks bilaterally Hearing: Normal speech reception.  Nose: No gross deformity or lesions. No purulent discharge. No turbinate hypertrophy.  Mouth/Oropharynx: Lips without any lesions. Dentition fair. No mucosal lesions within the oropharynx. No tonsillar enlargement, exudate, or lesions. Pharyngeal walls symmetrical. Uvula midline. Tongue midline without lesions. Larynx: See TFL if applicable Nasopharynx: See TFL if applicable Neck: Trachea midline. No masses. No thyromegaly or nodules palpated. No crepitus. Lymphatic: No lymphadenopathy in the neck. Respiratory: No stridor or distress.. Skin: No scars or lesions on face or neck. Neurologic: CN II-XII grossly intact.  Moving all extremities without gross abnormality. Other:  Physical Exam VITALS: BP- 100/70 HEENT: External ears normal. Throat normal. GENERAL: Pale appearance. Dix hallpike negative  Procedure: Bilateral ear microscopy using microscope (CPT [id number]) Pre-procedure diagnosis: tinnitus and dizziness Post-procedure diagnosis: same Indication: see above; given patient's otologic complaints and history, for improved and comprehensive examination of external ear and tympanic membrane, bilateral otologic examination using microscope was performed. Prior to  proceeding, verbal consent was obtained after discussion of R/B/A  Procedure: Patient was placed semi-recumbent. Both ear canals were examined using the microscope with findings below. Patient tolerated the procedure well.  Right ear:  No significant lesions pinna. EAC: no significant lesions. Canal is clear. Eczematoid changes. minimal TM: Intact   Left ear:  No significant lesions pinna. EAC: no significant lesions. Canal is clear. Eczematoid changes. minimal TM: Intact    Impression & Plans:  [name] is a 33 y.o. female  1. Dizziness and giddiness   2. Autonomic dysfunction   3. Tinnitus of both ears    - Findings and diagnoses discussed in detail with the patient. - Risks, benefits, and alternatives were reviewed. Through shared decision making, the patient elects to proceed with below. Assessment & Plan Vertigo and dizziness Chronic positional vertigo and dizziness likely due to autonomic dysfunction and hypotension, possibly POTS. Cardiac and neurological evaluations were unremarkable except for mild carotid stenosis. Symptoms significantly impair daily functioning. - Performed bilateral Dix-Hallpike positional testing, negative for BPPV and loose otoliths/crystals. - Recommended increased fluid and sodium intake, including high-sodium foods and electrolyte supplements. - Suggested trial of pseudoephedrine to elevate blood pressure, with caution against chronic use and advised cardiologist consultation before repeated dosing. - Advised follow-up with primary care provider and cardiologist for autonomic dysfunction and hypotension management. - Encouraged specialist evaluation for POTS despite long wait times. - Recommended repeat audiologic evaluation in October, with option for earlier follow-up if symptoms worsen or new concerns arise. - low suspicion of otologic cause given active symptoms today and normal dix hallpike and no nystagmus.   - Education materials provided  to the patient. - Patient instructed to return sooner or go to the ED if new/worsening symptoms develop.   Thank you for allowing me the opportunity to care for your patient. Please do not hesitate to contact me should you have any other questions.  Sincerely, Penne Croak, DO Otolaryngologist (ENT) [facility] Phone: [phone] Fax: [phone]  12/28/2023, 4:59 PM        [1]  Current Outpatient Medications:    ALPRAZolam  (XANAX ) 0.5 MG tablet, Take 1 tablet (0.5 mg total) by mouth as needed for anxiety or sleep., Disp: 30 tablet, Rfl: 4   ALPRAZolam  (XANAX ) 0.5 MG tablet, Take 1 tablet (0.5 mg total) by mouth daily as needed., Disp: 30 tablet, Rfl: 1   ascorbic acid (VITAMIN C) 500 MG tablet, Take 500 mg by mouth every other day., Disp: , Rfl:    Cyanocobalamin  (VITAMIN B-12 PO), Take 1 tablet by mouth daily., Disp: , Rfl:    lamoTRIgine  (LAMICTAL ) 200 MG tablet, Take 1 tablet (200 mg total) by mouth daily., Disp: 90 tablet, Rfl: 0   levonorgestrel  (MIRENA , 52 MG,) 20 MCG/DAY IUD, 1 each by Intrauterine route once., Disp: , Rfl:    LORATADINE PO, Take 10 mg by mouth daily. (Patient taking differently: Take 10 mg by mouth as needed.), Disp: , Rfl:    Meclizine  HCl (BONINE PO), Take by mouth daily as needed. , Disp: , Rfl:  midodrine  (PROAMATINE ) 5 MG tablet, Take 1 tablet (5 mg total) by mouth 2 (two) times daily with a meal., Disp: 180 tablet, Rfl: 1   minoxidil  (LONITEN ) 2.5 MG tablet, Take 0.5 tablets (1.25 mg total) by mouth daily., Disp: 30 tablet, Rfl: 2   Multiple Vitamins-Minerals (MULTIVITAL) tablet, Take 1 tablet by mouth daily. Gummy, Disp: , Rfl:    Naproxen Sodium (ALEVE PO), Take 2 tablets by mouth daily as needed (for pain)., Disp: , Rfl:    PARoxetine  (PAXIL ) 40 MG tablet, Take 1 tablet (40 mg total) by mouth daily., Disp: 90 tablet, Rfl: 0   traZODone  (DESYREL ) 150 MG tablet, Take 0.5 tablets (75 mg total) by mouth at bedtime as needed for  sleep., Disp: 45 tablet, Rfl: 0   Vitamin D , Ergocalciferol , (DRISDOL ) 1.25 MG (50000 UNIT) CAPS capsule, Take 1 capsule (50,000 Units total) by mouth every 7 (seven) days., Disp: 12 capsule, Rfl: 0

## 2023-12-28 NOTE — Therapy (Signed)
 OUTPATIENT PHYSICAL THERAPY VESTIBULAR TREATMENT     Patient Name: [name] MRN: [id number] DOB:1990/09/26, 33 y.o., female Today's Date: 12/28/2023  END OF SESSION:  PT End of Session - 12/28/23 1445     Visit Number 3    Number of Visits 9    Date for Recertification  02/03/24    Authorization Type [place] Medicaid/[employer]-**Auth#: [id number] , approved from 12/05/2023-02/03/2024.*    Authorization - Visit Number 3    Authorization - Number of Visits 60   combined PT/OT   PT Start Time 1447    PT Stop Time 1527    PT Time Calculation (min) 40 min    Equipment Utilized During Treatment Gait belt   for MCTSIB testing   Activity Tolerance Patient tolerated treatment well    Behavior During Therapy WFL for tasks assessed/performed           Past Medical History:  Diagnosis Date   ADHD (attention deficit hyperactivity disorder)    Allergy    Anxiety    Autonomic nervous system disorder    states she has falling but not syncope due to loss of blood pressure   Depression    Environmental allergies    GERD (gastroesophageal reflux disease)    Myopia of both eyes    Obesity    Syncope and collapse    Vitamin B 12 deficiency    Vitamin D  deficiency    History reviewed. No pertinent surgical history. 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   Saw ENT and they ruled out crystals contributing to dizziness.  Are there any exercises I can do sitting? Pt accompanied by: self  PERTINENT HISTORY: autonomic nervous system disorder; tinnitus; hx of migraines (thinks related to Midodrine ); ADHD  PAIN:  Are you having pain? No  PRECAUTIONS: Fall  RED FLAGS: None   WEIGHT BEARING RESTRICTIONS: No  FALLS: Has patient fallen in last 6 months? A lot-typically at least 2 falls per day-knees collapse directly under  LIVING ENVIRONMENT: Lives with: lives with their family Lives in: House/apartment Stairs: 3 steps to enter home Has following equipment at home: None  PLOF: Independent and Leisure: enjoys shopping, cannot exercise,drive, go anywhere *Looking at stationary bike for home  PATIENT GOALS: Honestly don't know  OBJECTIVE:    TODAY'S TREATMENT: 12/28/2023 Activity Comments  Vitals 106/72 HR 87 bpm   Seated there ex: LAQ Seated march Hip adduction ball squeezes Ankle pumps 10 reps  DGI 19/24-see below  Corner balance exercises: Feet apart/feet together EO and EC head turns/nods Feels appropriate challenge, mild sway with feet together         M-CTSIB  Condition 1: Firm Surface, EO 30 Sec, Normal Sway  Condition 2: Firm Surface, EC 30 Sec, Mild Sway  Condition 3: Foam Surface, EO 30 Sec, Mild Sway  Condition 4: Foam Surface, EC 24 Sec,  Moderate and Severe Sway     [facility] PT Assessment - 12/28/23 1510       Standardized Balance Assessment   Standardized Balance Assessment Dynamic Gait Index      Dynamic Gait Index   Level Surface Mild Impairment    Change in Gait Speed Normal    Gait with Horizontal Head Turns Moderate Impairment    Gait with Vertical Head Turns Mild Impairment    Gait and Pivot Turn Normal    Step Over Obstacle Mild Impairment    Step Around Obstacles Normal    Steps Normal    Total Score 19    DGI comment: Scores <19/24 indicate  increased fall risk             PATIENT EDUCATION: Education details: HEP updates; *Perform corner balance exercise on good days when she feels best and hold walls for safety as needed; objective measure results; importance of incorporating aerobic exercise, increasing general activity level Person educated: Patient Education method: Explanation Education comprehension: verbalized understanding  HOME EXERCISE PROGRAM: Access Code: [email or web address] URL: [email or web address] Date: 12/28/2023 Prepared by: [facility] - [facility]  Exercises - Supine Bridge  - 2-3 x weekly - 1-3 sets - 10 reps - Bridge on Heels  - 2-3 x weekly - 1-3 sets - 10 reps - Supine Active Straight Leg Raise  - 2-3 x weekly - 3 sets - 10 reps - Sidelying Hip Abduction  - 2-3 x weekly - 3 sets - 10 reps - Seated Long Arc Quad  - 1 x daily - 7 x weekly - 3 sets - 10 reps - Seated March  - 1 x daily - 7 x weekly - 3 sets - 10 reps - Seated Hip Adduction Isometrics with Ball  - 1 x daily - 7 x weekly - 3 sets - 10 reps - 3 sec hold - Seated Heel Toe Raises  - 1 x daily - 7 x weekly - 3 sets - 10 reps - Corner Balance Feet Together With Eyes Open  - 1 x daily - 7 x weekly - 1-2 sets - 5 reps      Note: Objective measures were completed at Evaluation unless otherwise noted.  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  COGNITION: Overall cognitive status: Within functional limits for tasks assessed   SENSATION: Not testedNo reports of numbness or hx of neuropathy  POSTURE:  rounded shoulders and forward head  Cervical ROM:    Active A/PROM (deg) eval  Flexion 30  Extension 30  Right lateral flexion   Left lateral flexion   Right rotation 70  Left rotation 70  (Blank rows = not tested)  BED MOBILITY:  Independent  TRANSFERS: Assistive device utilized: None  Sit to stand: Modified  independence Stand to sit: Modified independence  GAIT: Gait pattern: slowed, guarded due to occasional LOB from sudden dizziness and falls and step through pattern Distance walked: 25 ft Assistive device utilized: None Level of assistance: SBA   PATIENT SURVEYS:  DHI: THE DIZZINESS HANDICAP INVENTORY (DHI)  P1. Does looking up increase your problem? 2 = Sometimes  E2. Because of your problem, do you feel frustrated? 4 = Yes  F3. Because of your problem, do you restrict your travel for business or recreation?  4 = Yes  P4. Does walking down the aisle of a supermarket increase your problems?  2 = Sometimes  F5. Because of your problem, do you have difficulty getting into or out of bed?  4 = Yes  F6. Does your problem significantly restrict your participation in social activities, such as going out to dinner, going to the movies, dancing, or going to parties? 4 = Yes  F7. Because of your problem, do you have difficulty reading?  0 = No  P8. Does performing more ambitious activities such as sports, dancing, household chores (sweeping or putting dishes away) increase your problems?  4 = Yes  E9. Because of your problem, are you afraid to leave your home without having without having someone accompany you?  4 = Yes  E10. Because of your problem have you been embarrassed in front of others?  2 = Sometimes  P11. Do quick movements of your head increase your problem?  2 = Sometimes  F12. Because of your problem, do you avoid heights?  4 = Yes  P13. Does turning over in bed increase your problem?  0 = No  F14. Because of your problem, is it difficult for you to do strenuous homework or yard work? 4 = Yes  E15. Because of your problem, are you afraid people may think you are intoxicated? 0 = No  F16. Because of your problem, is it difficult for you to go for a walk by yourself?  4 = Yes  P17. Does walking down a sidewalk increase your problem?  0 = No  E18.Because of your problem, is it difficult  for you to concentrate 2 = Sometimes  F19. Because of your problem, is it difficult for you to walk around your house in the dark? 0 = No  E20. Because of your problem, are you afraid to stay home alone?  0 = No  E21. Because of your problem, do you feel handicapped? 4 = Yes  E22. Has the problem placed stress on your relationships with members of your family or friends? 0 = No  E23. Because of your problem, are you depressed?  4 = Yes  F24. Does your problem interfere with your job or household responsibilities?  4 = Yes  P25. Does bending over increase your problem?  4 = Yes  TOTAL 64    DHI Scoring Instructions  The patient is asked to answer each question as it pertains to dizziness or unsteadiness problems, specifically  considering their condition during the last month. Questions are designed to incorporate functional (F), physical  (P), and emotional (E) impacts on disability.   Scores greater than 10 points should be referred to balance specialists for further evaluation.   16-34 Points (mild handicap)  36-52 Points (moderate handicap)  54+ Points (severe handicap)  Minimally Detectable Change: 17 points ([address] [place], 1990)  [place], G. SHAUNNA. and [place], C. W. (1990). The development of the Dizziness Handicap Inventory. Archives of Otolaryngology - Head and Neck Surgery 116(4): [id number].   VESTIBULAR ASSESSMENT:  GENERAL OBSERVATION: wears glasses for distance   SYMPTOM BEHAVIOR:  Subjective history: Reports 2 separate kinds of dizziness:  1-room spinning and ear is off and 2-couple seconds of unsteady and like she may fall.  Has hx of migraines, hx of motion sickness; no head injury.  Notes that stress tends to make symptoms worse.  Non-Vestibular symptoms: tinnitus and nausea/vomiting  Type of dizziness: Spinning/Vertigo, Unsteady with head/body turns, Lightheadedness/Faint, Funny feeling in the head, and World moves  Frequency: 2-3x/wk  Duration:  minutes/half-an-hour   Aggravating factors: Nothing-sometimes tilting head, but not always  Relieving factors: Meclizine   Progression of symptoms: worse  OCULOMOTOR EXAM:  Ocular Alignment: normal  Ocular ROM: No Limitations  Spontaneous Nystagmus: absent  Gaze-Induced Nystagmus: absent  Smooth Pursuits: intact  Saccades: intact  Convergence/Divergence: 0 cm   Cover-cross-cover test: NT   VESTIBULAR - OCULAR REFLEX:   Slow VOR: Comment: 2/10 with horizontal, 3/10 with vertical   VOR Cancellation: Comment: increased symtpoms  Head-Impulse Test: HIT Right: negative but feels dizzy HIT Left: negative  Dynamic Visual Acuity: NT   POSITIONAL TESTING: Right Dix-Hallpike: no nystagmus and dizzy upon coming up to sit Left Dix-Hallpike: no nystagmus and dizzy upon coming up to sit Right Roll Test: no nystagmus Left Roll Test: no nystagmus  Vitals:  105/77, HR 84 (sitting)  MOTION SENSITIVITY: *Reports sitting to standing is the worst*  Motion Sensitivity Quotient Intensity: 0 = none, 1 = Lightheaded, 2 = Mild, 3 = Moderate, 4 = Severe, 5 = Vomiting  Intensity  1. Sitting to supine 0  2. Supine to L side 0  3. Supine to R side 0  4. Supine to sitting 1  5. L Hallpike-Dix 0  6. Up from L  1  7. R Hallpike-Dix 0  8. Up from R  1  9. Sitting, head tipped to L knee 0  10. Head up from L knee 1  11. Sitting, head tipped to R knee 0  12. Head up from R knee 2  13. Sitting head turns x5 1  14.Sitting head nods x5 1-2  15. In stance, 180 turn to L  NT  16. In stance, 180 turn to R NT    OTHOSTATICS: not done                                                                                                                               TREATMENT DATE: 12/05/2023      GOALS: Goals reviewed with patient? Yes  SHORT TERM GOALS: Target date: 01/06/2024  Pt will be independent with HEP for improved dizziness, decreased falls. Baseline: DHI 64, reports at least 2  falls/day Goal status: INITIAL  2.  DHI score to improved to less than or equal to 46 for decreased dizziness impacting ADLs. Baseline: 64 Goal status: INITIAL  3.  Pt will verbalize understanding of fall prevention in home environment.  Baseline:  2 falls per day (quick dizzy onset and legs give way)  Goal status:  INITIAL   LONG TERM GOALS: Target date: 02/03/2024  Pt will be independent with HEP for improved dizziness, decreased falls. Baseline: reports 2 falls/day Goal status: INITIAL  2.  Pt will improve DHI score to less than or equal to 28 for decreased dizziness impacting ADLs. Baseline: 64 Goal status: INITIAL  3.  Pt will improve DGI score to at least 20/24 to decrease fall risk.  Baseline: 19/24 Goal status: INITIAL  4.  Pt will verbalize/demo ability to return to 15-30 minutes of aerobic  activity to improve overall functional mobility. Baseline: no current HEP, stays in bed a good portion of day Goal status: INITIAL  ASSESSMENT:  CLINICAL IMPRESSION: Assessed DGI today with score 19/24 just at increased risk of falls.  Assessed MCTSIB test today, with pt having mild sway Condition 2 and mod/severe sway Condition 4, indicating decreased vestibular system use for balance. Of note, she does state this sensation of unsteadiness is not the same dizziness/lightheadedness that she has been reporting.  Addressed updating HEP to reflect seated exercises in the setting of her POTS and corner balance exercises to address decreased vestibular system use for balance.  She will benefit from targeted instruction in aerobic exercise at home as well as gradual increase in her daily activities (as some days, she reports staying in bed).  She will benefit from skilled PT towards goals for improved overall functional mobility and decreased fall risk.       From Eval: Patient is a 33 y.o. female who was seen today for physical therapy evaluation and treatment for dizziness. She has hx of  dizziness symptoms since 2017, but reports they have worsened in the past month.  She describes two types of dizziness:  1) spinning type dizziness where world spins, but she denies this is brought on by any specific motion or position; 2) quick onset dizziness that will cause her knees to buckle and fall.  She notes typically 2 falls per day.  She has hx of migraines, hx of motion sickness, tinnitus, work up for POTS, though she notes it will take some time before she can get into specialist for actual diagnosis.  With eval today, oculomotor testing is WFL; VOR and VOR cancellation brings on symptoms (this is consistent with hx of motion sickness).  HIT is negative bilaterally, but R brings on symptoms.  Positional testing for BPPV is negative today, but she has increased symptoms with coming up from supine from L and R DH positions.  Other motions on MSQ that increase symptoms are seated nose to knee and seated head motions; did not try 180 turns in standing, as pt's symptoms were to the level of nausea by end of session.  She appears to have some motion sensitivity today and symptoms brought on by VOR/VOR cancellation.  DHI score of 64% indicates severe handicap from dizziness.  Further balance testing may be warranted to look at vestibular system use for balance; did not pursue the autonomic dizziness today, other than to educate on slowed transitions and general lower extremity movements for gentle exercise.     OBJECTIVE IMPAIRMENTS: Abnormal gait, decreased activity tolerance, decreased balance, decreased mobility, difficulty walking, dizziness, and postural dysfunction.   ACTIVITY LIMITATIONS: bending, squatting, reach over head, and locomotion level  PARTICIPATION LIMITATIONS: meal prep, cleaning, driving, shopping, community activity, and school  PERSONAL FACTORS: 3+ comorbidities: see above are also affecting patient's functional outcome.   REHAB POTENTIAL: Good  CLINICAL DECISION MAKING:  Stable/uncomplicated  EVALUATION COMPLEXITY: Low   PLAN:  PT FREQUENCY: 1x/week  PT DURATION: 8 weeks plus eval  PLANNED INTERVENTIONS: 97750- Physical Performance Testing, 97110-Therapeutic exercises, 97530- Therapeutic activity, 97112- Neuromuscular re-education, 97535- Self Care, [id number]- Manual therapy, [phone]- Gait training, [phone]- Canalith repositioning, Patient/Family education, Balance training, and Vestibular training  PLAN FOR NEXT SESSION: Review updates to HEP; gentle aerobic activity for exercise tolerance and standing/gait activities she may increase at home   Jesiah Yerby W., PT 12/28/2023, 3:45 PM   [facility] [address] [place], [place] [address] Phone # [phone] Fax # [phone]

## 2024-01-02 DIAGNOSIS — F4312 Post-traumatic stress disorder, chronic: Secondary | ICD-10-CM | POA: Diagnosis not present

## 2024-01-02 DIAGNOSIS — F5101 Primary insomnia: Secondary | ICD-10-CM | POA: Diagnosis not present

## 2024-01-02 DIAGNOSIS — F411 Generalized anxiety disorder: Secondary | ICD-10-CM | POA: Diagnosis not present

## 2024-01-02 DIAGNOSIS — F341 Dysthymic disorder: Secondary | ICD-10-CM | POA: Diagnosis not present

## 2024-01-02 NOTE — Therapy (Signed)
 " OUTPATIENT PHYSICAL THERAPY VESTIBULAR TREATMENT     Patient Name: DAMIRA KEM MRN: [id number] DOB:1990/07/23, 33 y.o., female Today's Date: 01/03/2024  END OF SESSION:  PT End of Session - 01/03/24 1700     Visit Number 4    Number of Visits 9    Date for Recertification  02/03/24    Authorization Type [place] [employer]-**Auth#: [id number] , approved from 12/05/2023-02/03/2024.*    Authorization - Visit Number 4    Authorization - Number of Visits 60   combined PT/OT   PT Start Time 1616    PT Stop Time 1702    PT Time Calculation (min) 46 min    Equipment Utilized During Treatment Gait belt   for MCTSIB testing   Activity Tolerance Patient tolerated treatment well    Behavior During Therapy WFL for tasks assessed/performed            Past Medical History:  Diagnosis Date   ADHD (attention deficit hyperactivity disorder)    Allergy    Anxiety    Autonomic nervous system disorder    states she has falling but not syncope due to loss of blood pressure   Depression    Environmental allergies    GERD (gastroesophageal reflux disease)    Myopia of both eyes    Obesity    Syncope and collapse    Vitamin B 12 deficiency    Vitamin D  deficiency    History reviewed. No pertinent surgical history. Patient Active Problem List   Diagnosis Date Noted   OSA on CPAP 12/01/2022   Abnormal uterine bleeding 08/12/2022   Syncope and collapse 04/27/2022   GERD (gastroesophageal reflux disease) 11/03/2020   Autonomic dysfunction 11/03/2020   Severe needle phobia 11/03/2020   Severe episode of recurrent major depressive disorder (HCC) 02/14/2013   Overweight 01/26/2013    PCP: Job Lukes, PA REFERRING PROVIDER: Anice Riis, DO   REFERRING DIAG: R42 (ICD-10-CM) - Dizziness and giddiness   THERAPY DIAG:  Dizziness and giddiness  History of falling  Abnormal posture  ONSET DATE: 11/02/2023 (MD referral)  Rationale for Evaluation and Treatment:  Rehabilitation  SUBJECTIVE:   SUBJECTIVE STATEMENT: Had a fall yesterday- lightheadedness and my legs cut out from under me. Denies injury but still feeling off since. Currently wearing compression socks to see if they help. Bought a recumbent bike 1-2 miles with HR elevating to 120-130 bpm and reports that she is tolerating this fine.   Pt accompanied by: self  PERTINENT HISTORY: autonomic nervous system disorder; tinnitus; hx of migraines (thinks related to Midodrine ); ADHD  PAIN:  Are you having pain? No  PRECAUTIONS: Fall  RED FLAGS: None   WEIGHT BEARING RESTRICTIONS: No  FALLS: Has patient fallen in last 6 months? A lot-typically at least 2 falls per day-knees collapse directly under  LIVING ENVIRONMENT: Lives with: lives with their family Lives in: House/apartment Stairs: 3 steps to enter home Has following equipment at home: None  PLOF: Independent and Leisure: enjoys shopping, cannot exercise,drive, go anywhere *Looking at stationary bike for home  PATIENT GOALS: Honestly don't know  OBJECTIVE:     TODAY'S TREATMENT: 01/03/24 Activity Comments  Vitals at start of session 107/74 mmHg, 84 bpm, 96 bpm   Nustep L6 x 8 min UE/LEs  Increased resistance to L6 per pt's request    Maintaining ~74 SPM  HR check at 4 min: 118 bpm HR check at 8 min: 114 bpm   sitting on pball: alt LAQ 3# 2x20 alt  march 2x20 3# alt UE/LE raise 3# 2x20 Green TB rows 2x10 Occasional CGA d/t instability. Pt using B UE support against pball for stability.  HR: 90-115 bpm    Pt reports feeling headachey at end of session without other sx. 98% spO2, 99 bpm           PATIENT EDUCATION: Education details: briefly discussed interval training- patient did not seem receptive to trying this at this time  Person educated: Patient Education method: Explanation, Demonstration, Tactile cues, Verbal cues, and Handouts Education comprehension: verbalized understanding   HOME EXERCISE  PROGRAM: Access Code: [email or web address] URL: [email or web address] Date: 12/28/2023 Prepared by: [facility] - Outpatient  Rehab - [facility]  Exercises - Supine Bridge  - 2-3 x weekly - 1-3 sets - 10 reps - Bridge on Heels  - 2-3 x weekly - 1-3 sets - 10 reps - Supine Active Straight Leg Raise  - 2-3 x weekly - 3 sets - 10 reps - Sidelying Hip Abduction  - 2-3 x weekly - 3 sets - 10 reps - Seated Long Arc Quad  - 1 x daily - 7 x weekly - 3 sets - 10 reps - Seated March  - 1 x daily - 7 x weekly - 3 sets - 10 reps - Seated Hip Adduction Isometrics with Ball  - 1 x daily - 7 x weekly - 3 sets - 10 reps - 3 sec hold - Seated Heel Toe Raises  - 1 x daily - 7 x weekly - 3 sets - 10 reps - Corner Balance Feet Together With Eyes Open  - 1 x daily - 7 x weekly - 1-2 sets - 5 reps      Note: Objective measures were completed at Evaluation unless otherwise noted.  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  COGNITION: Overall cognitive status: Within functional limits for tasks assessed   SENSATION: Not testedNo reports of numbness or hx of neuropathy  POSTURE:  rounded shoulders and forward head  Cervical ROM:    Active A/PROM (deg) eval  Flexion 30  Extension 30  Right lateral flexion   Left lateral flexion   Right rotation 70  Left rotation 70  (Blank rows = not tested)  BED MOBILITY:  Independent  TRANSFERS: Assistive device utilized: None  Sit to stand: Modified independence Stand to sit: Modified independence  GAIT: Gait pattern: slowed, guarded due to occasional LOB from sudden dizziness and falls and step through pattern Distance walked: 25 ft Assistive device utilized: None Level of assistance: SBA   PATIENT SURVEYS:  DHI: THE DIZZINESS HANDICAP INVENTORY (DHI)  P1. Does looking up increase your problem? 2 = Sometimes  E2. Because of your problem, do you feel  frustrated? 4 = Yes  F3. Because of your problem, do you restrict your travel for business or recreation?  4 = Yes  P4. Does walking down the aisle of a supermarket increase your problems?  2 = Sometimes  F5. Because of your problem, do you have difficulty getting into or out of bed?  4 = Yes  F6. Does your problem significantly restrict your participation in social activities, such as going out to dinner, going to the movies, dancing, or going to parties? 4 = Yes  F7. Because of your problem, do you have difficulty reading?  0 = No  P8. Does performing more ambitious activities such as sports, dancing, household chores (sweeping or putting  dishes away) increase your problems?  4 = Yes  E9. Because of your problem, are you afraid to leave your home without having without having someone accompany you?  4 = Yes  E10. Because of your problem have you been embarrassed in front of others?  2 = Sometimes  P11. Do quick movements of your head increase your problem?  2 = Sometimes  F12. Because of your problem, do you avoid heights?  4 = Yes  P13. Does turning over in bed increase your problem?  0 = No  F14. Because of your problem, is it difficult for you to do strenuous homework or yard work? 4 = Yes  E15. Because of your problem, are you afraid people may think you are intoxicated? 0 = No  F16. Because of your problem, is it difficult for you to go for a walk by yourself?  4 = Yes  P17. Does walking down a sidewalk increase your problem?  0 = No  E18.Because of your problem, is it difficult for you to concentrate 2 = Sometimes  F19. Because of your problem, is it difficult for you to walk around your house in the dark? 0 = No  E20. Because of your problem, are you afraid to stay home alone?  0 = No  E21. Because of your problem, do you feel handicapped? 4 = Yes  E22. Has the problem placed stress on your relationships with members of your family or friends? 0 = No  E23. Because of your problem, are  you depressed?  4 = Yes  F24. Does your problem interfere with your job or household responsibilities?  4 = Yes  P25. Does bending over increase your problem?  4 = Yes  TOTAL 64    DHI Scoring Instructions  The patient is asked to answer each question as it pertains to dizziness or unsteadiness problems, specifically  considering their condition during the last month. Questions are designed to incorporate functional (F), physical  (P), and emotional (E) impacts on disability.   Scores greater than 10 points should be referred to balance specialists for further evaluation.   16-34 Points (mild handicap)  36-52 Points (moderate handicap)  54+ Points (severe handicap)  Minimally Detectable Change: 17 points ([address] Z[place]le, 1990)  Marion, G. SHAUNNA. and Collins, C. W. (1990). The development of the Dizziness Handicap Inventory. Archives of Otolaryngology - Head and Neck Surgery 116(4): [id number].   VESTIBULAR ASSESSMENT:  GENERAL OBSERVATION: wears glasses for distance   SYMPTOM BEHAVIOR:  Subjective history: Reports 2 separate kinds of dizziness:  1-room spinning and ear is off and 2-couple seconds of unsteady and like she may fall.  Has hx of migraines, hx of motion sickness; no head injury.  Notes that stress tends to make symptoms worse.  Non-Vestibular symptoms: tinnitus and nausea/vomiting  Type of dizziness: Spinning/Vertigo, Unsteady with head/body turns, Lightheadedness/Faint, Funny feeling in the head, and World moves  Frequency: 2-3x/wk  Duration: minutes/half-an-hour   Aggravating factors: Nothing-sometimes tilting head, but not always  Relieving factors: Meclizine   Progression of symptoms: worse  OCULOMOTOR EXAM:  Ocular Alignment: normal  Ocular ROM: No Limitations  Spontaneous Nystagmus: absent  Gaze-Induced Nystagmus: absent  Smooth Pursuits: intact  Saccades: intact  Convergence/Divergence: 0 cm   Cover-cross-cover test: NT   VESTIBULAR - OCULAR  REFLEX:   Slow VOR: Comment: 2/10 with horizontal, 3/10 with vertical   VOR Cancellation: Comment: increased symtpoms  Head-Impulse Test: HIT Right: negative but feels dizzy HIT Left:  negative  Dynamic Visual Acuity: NT   POSITIONAL TESTING: Right Dix-Hallpike: no nystagmus and dizzy upon coming up to sit Left Dix-Hallpike: no nystagmus and dizzy upon coming up to sit Right Roll Test: no nystagmus Left Roll Test: no nystagmus  Vitals:  105/77, HR 84 (sitting)  MOTION SENSITIVITY: *Reports sitting to standing is the worst*  Motion Sensitivity Quotient Intensity: 0 = none, 1 = Lightheaded, 2 = Mild, 3 = Moderate, 4 = Severe, 5 = Vomiting  Intensity  1. Sitting to supine 0  2. Supine to L side 0  3. Supine to R side 0  4. Supine to sitting 1  5. L Hallpike-Dix 0  6. Up from L  1  7. R Hallpike-Dix 0  8. Up from R  1  9. Sitting, head tipped to L knee 0  10. Head up from L knee 1  11. Sitting, head tipped to R knee 0  12. Head up from R knee 2  13. Sitting head turns x5 1  14.Sitting head nods x5 1-2  15. In stance, 180 turn to L  NT  16. In stance, 180 turn to R NT    OTHOSTATICS: not done                                                                                                                               TREATMENT DATE: 12/05/2023      GOALS: Goals reviewed with patient? Yes  SHORT TERM GOALS: Target date: 01/06/2024  Pt will be independent with HEP for improved dizziness, decreased falls. Baseline: DHI 64, reports at least 2 falls/day Goal status: INITIAL  2.  DHI score to improved to less than or equal to 46 for decreased dizziness impacting ADLs. Baseline: 64 Goal status: INITIAL  3.  Pt will verbalize understanding of fall prevention in home environment.  Baseline:  2 falls per day (quick dizzy onset and legs give way)  Goal status:  INITIAL   LONG TERM GOALS: Target date: 02/03/2024  Pt will be independent with HEP for improved dizziness,  decreased falls. Baseline: reports 2 falls/day Goal status: INITIAL  2.  Pt will improve DHI score to less than or equal to 28 for decreased dizziness impacting ADLs. Baseline: 64 Goal status: INITIAL  3.  Pt will improve DGI score to at least 20/24 to decrease fall risk.  Baseline: 19/24 Goal status: INITIAL  4.  Pt will verbalize/demo ability to return to 15-30 minutes of aerobic activity to improve overall functional mobility. Baseline: no current HEP, stays in bed a good portion of day Goal status: INITIAL  ASSESSMENT:  CLINICAL IMPRESSION: Patient arrived to session with report of a fall yesterday resulting from LE giving out. Denies injury but reports still not feeling well since. Vitals WNL, thus proceeded with session while monitoring vitals and symptoms. Patient tolerated aerobic training with appropriate HR response. Sitting core strengthening was performed seated on a physioball for  additional core/balance challenge. Patient tolerated these activities without complaints until end of session when she noted slight HA. Patient stated that she felt safe to drive upon leaving.       From Eval: Patient is a 33 y.o. female who was seen today for physical therapy evaluation and treatment for dizziness. She has hx of dizziness symptoms since 2017, but reports they have worsened in the past month.  She describes two types of dizziness:  1) spinning type dizziness where world spins, but she denies this is brought on by any specific motion or position; 2) quick onset dizziness that will cause her knees to buckle and fall.  She notes typically 2 falls per day.  She has hx of migraines, hx of motion sickness, tinnitus, work up for POTS, though she notes it will take some time before she can get into specialist for actual diagnosis.  With eval today, oculomotor testing is WFL; VOR and VOR cancellation brings on symptoms (this is consistent with hx of motion sickness).  HIT is negative  bilaterally, but R brings on symptoms.  Positional testing for BPPV is negative today, but she has increased symptoms with coming up from supine from L and R DH positions.  Other motions on MSQ that increase symptoms are seated nose to knee and seated head motions; did not try 180 turns in standing, as pt's symptoms were to the level of nausea by end of session.  She appears to have some motion sensitivity today and symptoms brought on by VOR/VOR cancellation.  DHI score of 64% indicates severe handicap from dizziness.  Further balance testing may be warranted to look at vestibular system use for balance; did not pursue the autonomic dizziness today, other than to educate on slowed transitions and general lower extremity movements for gentle exercise.     OBJECTIVE IMPAIRMENTS: Abnormal gait, decreased activity tolerance, decreased balance, decreased mobility, difficulty walking, dizziness, and postural dysfunction.   ACTIVITY LIMITATIONS: bending, squatting, reach over head, and locomotion level  PARTICIPATION LIMITATIONS: meal prep, cleaning, driving, shopping, community activity, and school  PERSONAL FACTORS: 3+ comorbidities: see above are also affecting patient's functional outcome.   REHAB POTENTIAL: Good  CLINICAL DECISION MAKING: Stable/uncomplicated  EVALUATION COMPLEXITY: Low   PLAN:  PT FREQUENCY: 1x/week  PT DURATION: 8 weeks plus eval  PLANNED INTERVENTIONS: 97750- Physical Performance Testing, 97110-Therapeutic exercises, 97530- Therapeutic activity, 97112- Neuromuscular re-education, 97535- Self Care, 02859- Manual therapy, [phone]- Gait training, [phone]- Canalith repositioning, Patient/Family education, Balance training, and Vestibular training  PLAN FOR NEXT SESSION: Review updates to HEP; gentle aerobic activity for exercise tolerance and standing/gait activities she may increase at home   Louana Terrilyn Christians, PT, DPT 01/03/2024 5:14 PM  [facility] at [facility] [address], [address] [place], [place] [address] Phone # [phone] Fax # [phone]   "

## 2024-01-03 ENCOUNTER — Encounter: Payer: Self-pay | Admitting: Physical Therapy

## 2024-01-03 ENCOUNTER — Ambulatory Visit: Admitting: Physical Therapy

## 2024-01-03 ENCOUNTER — Other Ambulatory Visit (HOSPITAL_BASED_OUTPATIENT_CLINIC_OR_DEPARTMENT_OTHER): Payer: Self-pay

## 2024-01-03 DIAGNOSIS — R293 Abnormal posture: Secondary | ICD-10-CM

## 2024-01-03 DIAGNOSIS — R42 Dizziness and giddiness: Secondary | ICD-10-CM | POA: Diagnosis not present

## 2024-01-03 DIAGNOSIS — Z9181 History of falling: Secondary | ICD-10-CM

## 2024-01-03 MED ORDER — LAMOTRIGINE 200 MG PO TABS
200.0000 mg | ORAL_TABLET | Freq: Every day | ORAL | 0 refills | Status: AC
  Filled 2024-01-03: qty 90, 90d supply, fill #0

## 2024-01-08 NOTE — Progress Notes (Unsigned)
" °  Electrophysiology Office Note:   Date:  01/10/2024  ID:  [name], DOB 1990/09/05, MRN [id number]  Primary Cardiologist: None Primary Heart Failure: None Electrophysiologist: Danelle Birmingham, MD      History of Present Illness:   [name] is a 33 y.o. female with h/o autonomic dysfunction, obesity, OSA, severe depression, severe needle phobia seen today for routine electrophysiology followup.   Since last being seen in our clinic the patient reports doing some better. She is working with vestibular PT.  She notes she gets about 3L of fluid in per day and 1-2gm of salt per day.  She feels better on days when she eats more salt. She wore a cardiac monitor and it has resulted (not read).   She denies chest pain, dyspnea, PND, orthopnea, nausea, vomiting, dizziness, syncope, edema, weight gain, or early satiety.   Review of systems complete and found to be negative unless listed in HPI.   EP Information / Studies Reviewed:    EKG is not ordered today. EKG from 12/01/23 reviewed which showed NSR 89 bpm      Arrhythmia / AAD / Pertinent EP Studies Autonomic dysfunction > tried midodrine  but had HA's   Risk Assessment/Calculations:              Physical Exam:   VS:  BP 108/76   Pulse 85   Ht 5' 2 (1.575 m)   Wt 174 lb 6.4 oz (79.1 kg)   SpO2 96%   BMI 31.90 kg/m    Wt Readings from Last 3 Encounters:  01/10/24 174 lb 6.4 oz (79.1 kg)  12/26/23 170 lb (77.1 kg)  12/01/23 170 lb (77.1 kg)     GEN: Well nourished, well developed in no acute distress NECK: No JVD; No carotid bruits CARDIAC: Regular rate and rhythm, no murmurs, rubs, gallops RESPIRATORY:  Clear to auscultation without rales, wheezing or rhonchi  ABDOMEN: Soft, non-tender, non-distended EXTREMITIES:  No edema; No deformity   ASSESSMENT AND PLAN:    Autonomic Dysfunction  Palpitations  Dizziness  Mechanical Falls -previously referred to [facility]  -referred to Neurology for  dizziness > working with Vestibular PT currently  -water intake > ~ 3L per day -salt intake > ~ 1-2 gm.  Discussed starting 1gm Na tablet per day for one week, increase to 2gm daily the next week, then 3 gm daily and hold.  This is in addition to her other oral intake of Na.  If she adds 3gm Na+ tablet per day to her 1-2 gm she would be getting around 5gm daily   -recumbent bicycle > she got one for Christmas, has not started it yet  -will plan for labs pending Na intake at that time   Follow up with EP APP in 2 months    Signed, Daphne Barrack, NP-C, AGACNP-BC [facility] - Electrophysiology  01/10/2024, 9:35 AM  "

## 2024-01-09 ENCOUNTER — Ambulatory Visit

## 2024-01-10 ENCOUNTER — Telehealth: Payer: Self-pay | Admitting: Pulmonary Disease

## 2024-01-10 ENCOUNTER — Encounter: Payer: Self-pay | Admitting: Pulmonary Disease

## 2024-01-10 ENCOUNTER — Other Ambulatory Visit (HOSPITAL_BASED_OUTPATIENT_CLINIC_OR_DEPARTMENT_OTHER): Payer: Self-pay

## 2024-01-10 ENCOUNTER — Other Ambulatory Visit (HOSPITAL_COMMUNITY): Payer: Self-pay

## 2024-01-10 ENCOUNTER — Ambulatory Visit

## 2024-01-10 ENCOUNTER — Ambulatory Visit: Payer: Self-pay | Admitting: Pulmonary Disease

## 2024-01-10 ENCOUNTER — Ambulatory Visit: Attending: Pulmonary Disease | Admitting: Pulmonary Disease

## 2024-01-10 VITALS — BP 108/76 | HR 85 | Ht 62.0 in | Wt 174.4 lb

## 2024-01-10 DIAGNOSIS — R55 Syncope and collapse: Secondary | ICD-10-CM | POA: Diagnosis not present

## 2024-01-10 DIAGNOSIS — G909 Disorder of the autonomic nervous system, unspecified: Secondary | ICD-10-CM | POA: Insufficient documentation

## 2024-01-10 DIAGNOSIS — R293 Abnormal posture: Secondary | ICD-10-CM

## 2024-01-10 DIAGNOSIS — R001 Bradycardia, unspecified: Secondary | ICD-10-CM | POA: Diagnosis not present

## 2024-01-10 DIAGNOSIS — R000 Tachycardia, unspecified: Secondary | ICD-10-CM

## 2024-01-10 DIAGNOSIS — R42 Dizziness and giddiness: Secondary | ICD-10-CM | POA: Diagnosis not present

## 2024-01-10 DIAGNOSIS — Z9181 History of falling: Secondary | ICD-10-CM

## 2024-01-10 MED ORDER — MIDODRINE HCL 5 MG PO TABS: 5.0000 mg | ORAL_TABLET | Freq: Two times a day (BID) | ORAL | 1 refills | Status: AC

## 2024-01-10 NOTE — Telephone Encounter (Signed)
 I reviewed with pharmacy and they are getting it over the counter. Thanks!

## 2024-01-10 NOTE — Telephone Encounter (Signed)
 Pharmacy requesting a script for the pt to receive the 1MG  sodium tablets

## 2024-01-10 NOTE — Therapy (Signed)
 " OUTPATIENT PHYSICAL THERAPY VESTIBULAR TREATMENT and D/C Summary     Patient Name: [name] MRN: [id number] DOB:1990/03/19, 33 y.o., female Today's Date: 01/10/2024 PHYSICAL THERAPY DISCHARGE SUMMARY  Visits from Start of Care: 5  Current functional level related to goals / functional outcomes: See below for outcome measures.  Notes falls have been infrequent since start of care with only 1 single episode vs previous 2 falls/day.   Remaining deficits: Orthostatic intolerance   Education / Equipment: HEP   Patient agrees to discharge. Patient goals were partially met. Patient is being discharged due to being pleased with the current functional level.  END OF SESSION:  PT End of Session - 01/10/24 1616     Visit Number 5    Number of Visits 9    Date for Recertification  02/03/24    Authorization Type [place] [employer]-**Auth#: [id number] , approved from 12/05/2023-02/03/2024.*    Authorization - Visit Number 5    Authorization - Number of Visits 60   combined PT/OT   PT Start Time 1615    PT Stop Time 1700    PT Time Calculation (min) 45 min    Equipment Utilized During Treatment Gait belt   for MCTSIB testing   Activity Tolerance Patient tolerated treatment well    Behavior During Therapy WFL for tasks assessed/performed            Past Medical History:  Diagnosis Date   ADHD (attention deficit hyperactivity disorder)    Allergy    Anxiety    Autonomic nervous system disorder    states she has falling but not syncope due to loss of blood pressure   Depression    Environmental allergies    GERD (gastroesophageal reflux disease)    Myopia of both eyes    Obesity    Syncope and collapse    Vitamin B 12 deficiency    Vitamin D  deficiency    No past surgical history on file. 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 Reports yesterday experiencing general malaise, dizzy, HA and notes symptoms have been worse over past week  Pt accompanied by: self  PERTINENT HISTORY: autonomic nervous system disorder; tinnitus; hx of migraines (thinks related to Midodrine ); ADHD  PAIN:  Are you having pain? No  PRECAUTIONS: Fall  RED FLAGS: None   WEIGHT BEARING RESTRICTIONS: No  FALLS: Has patient fallen in last 6 months? A lot-typically at least 2 falls per day-knees collapse directly under  LIVING ENVIRONMENT: Lives with: lives with their family Lives in: House/apartment Stairs: 3 steps to enter home Has following equipment at home: None  PLOF: Independent and Leisure: enjoys shopping, cannot exercise,drive, go anywhere *Looking at stationary bike for home  PATIENT GOALS: Honestly don't know  OBJECTIVE:   TODAY'S TREATMENT: 01/10/24 Activity  Comments  Leg circumference measurements for compression garments   Vitals: 96/71 mmHg, 77 bpm No symptoms  Standing balance  -tandem forward/retro walk x 60 sec -feet together EC x 30 sec-no sway -feet together on foam: EC mod sway, EO w/ head turns x 30 sec, 96 bpm  Pt education regarding exercise prescription and frequency/intensity Verbalized understanding   DHI 54  DGI 21/24       TODAY'S TREATMENT: 01/03/24 Activity Comments   Vitals at start of session 107/74 mmHg, 84 bpm, 96 bpm   Nustep L6 x 8 min UE/LEs  Increased resistance to L6 per pt's request    Maintaining ~74 SPM  HR check at 4 min: 118 bpm HR check at 8 min: 114 bpm   sitting on pball: alt LAQ 3# 2x20 alt march 2x20 3# alt UE/LE raise 3# 2x20 Green TB rows 2x10 Occasional CGA d/t instability. Pt using B UE support against pball for stability.  HR: 90-115 bpm    Pt reports feeling headachey at end of session without other sx. 98% spO2, 99 bpm           PATIENT EDUCATION: Education details: briefly discussed interval training- patient did not seem receptive to trying this at this time  Person educated: Patient Education method: Explanation, Demonstration, Tactile cues, Verbal cues, and Handouts Education comprehension: verbalized understanding   HOME EXERCISE PROGRAM: Access Code: [email or web address] URL: [email or web address] Date: 12/28/2023 Prepared by: [facility] - Outpatient  Rehab - [facility]  Exercises - Supine Bridge  - 2-3 x weekly - 1-3 sets - 10 reps - Bridge on Heels  - 2-3 x weekly - 1-3 sets - 10 reps - Supine Active Straight Leg Raise  - 2-3 x weekly - 3 sets - 10 reps - Sidelying Hip Abduction  - 2-3 x weekly - 3 sets - 10 reps - Seated Long Arc Quad  - 1 x daily - 7 x weekly - 3 sets - 10 reps - Seated March  - 1 x daily - 7 x weekly - 3 sets - 10 reps - Seated Hip Adduction Isometrics with Ball  - 1 x daily - 7 x weekly - 3 sets - 10 reps - 3 sec hold - Seated Heel Toe Raises  - 1 x daily - 7 x weekly - 3 sets - 10 reps - Corner Balance Feet Together With Eyes Open  - 1 x daily - 7 x weekly - 1-2 sets - 5 reps      Note: Objective measures were completed at Evaluation unless otherwise noted.  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  COGNITION: Overall cognitive status: Within functional limits for tasks  assessed   SENSATION: Not testedNo reports of numbness or hx of neuropathy  POSTURE:  rounded shoulders and forward head  Cervical ROM:    Active A/PROM (deg) eval  Flexion 30  Extension 30  Right lateral flexion   Left lateral flexion   Right rotation 70  Left rotation 70  (Blank rows = not tested)  BED MOBILITY:  Independent  TRANSFERS: Assistive device utilized: None  Sit to stand: Modified independence Stand to sit: Modified independence  GAIT: Gait pattern: slowed, guarded due to occasional LOB from sudden dizziness and falls and step through pattern Distance walked: 25 ft Assistive device utilized: None Level of assistance: SBA   PATIENT SURVEYS:  DHI: THE DIZZINESS HANDICAP INVENTORY (DHI)  P1. Does looking  up increase your problem? 2 = Sometimes  E2. Because of your problem, do you feel frustrated? 4 = Yes  F3. Because of your problem, do you restrict your travel for business or recreation?  4 = Yes  P4. Does walking down the aisle of a supermarket increase your problems?  2 = Sometimes  F5. Because of your problem, do you have difficulty getting into or out of bed?  4 = Yes  F6. Does your problem significantly restrict your participation in social activities, such as going out to dinner, going to the movies, dancing, or going to parties? 4 = Yes  F7. Because of your problem, do you have difficulty reading?  0 = No  P8. Does performing more ambitious activities such as sports, dancing, household chores (sweeping or putting dishes away) increase your problems?  4 = Yes  E9. Because of your problem, are you afraid to leave your home without having without having someone accompany you?  4 = Yes  E10. Because of your problem have you been embarrassed in front of others?  2 = Sometimes  P11. Do quick movements of your head increase your problem?  2 = Sometimes  F12. Because of your problem, do you avoid heights?  4 = Yes  P13. Does turning over in bed increase  your problem?  0 = No  F14. Because of your problem, is it difficult for you to do strenuous homework or yard work? 4 = Yes  E15. Because of your problem, are you afraid people may think you are intoxicated? 0 = No  F16. Because of your problem, is it difficult for you to go for a walk by yourself?  4 = Yes  P17. Does walking down a sidewalk increase your problem?  0 = No  E18.Because of your problem, is it difficult for you to concentrate 2 = Sometimes  F19. Because of your problem, is it difficult for you to walk around your house in the dark? 0 = No  E20. Because of your problem, are you afraid to stay home alone?  0 = No  E21. Because of your problem, do you feel handicapped? 4 = Yes  E22. Has the problem placed stress on your relationships with members of your family or friends? 0 = No  E23. Because of your problem, are you depressed?  4 = Yes  F24. Does your problem interfere with your job or household responsibilities?  4 = Yes  P25. Does bending over increase your problem?  4 = Yes  TOTAL 64    DHI Scoring Instructions  The patient is asked to answer each question as it pertains to dizziness or unsteadiness problems, specifically  considering their condition during the last month. Questions are designed to incorporate functional (F), physical  (P), and emotional (E) impacts on disability.   Scores greater than 10 points should be referred to balance specialists for further evaluation.   16-34 Points (mild handicap)  36-52 Points (moderate handicap)  54+ Points (severe handicap)  Minimally Detectable Change: 17 points ([address] [place], 1990)  [place], G. SHAUNNA. and [place], C. W. (1990). The development of the Dizziness Handicap Inventory. Archives of Otolaryngology - Head and Neck Surgery 116(4): [id number].   VESTIBULAR ASSESSMENT:  GENERAL OBSERVATION: wears glasses for distance   SYMPTOM BEHAVIOR:  Subjective history: Reports 2 separate kinds of dizziness:  1-room spinning  and ear is off and 2-couple seconds of unsteady and like she may fall.  Has hx of  migraines, hx of motion sickness; no head injury.  Notes that stress tends to make symptoms worse.  Non-Vestibular symptoms: tinnitus and nausea/vomiting  Type of dizziness: Spinning/Vertigo, Unsteady with head/body turns, Lightheadedness/Faint, Funny feeling in the head, and World moves  Frequency: 2-3x/wk  Duration: minutes/half-an-hour   Aggravating factors: Nothing-sometimes tilting head, but not always  Relieving factors: Meclizine   Progression of symptoms: worse  OCULOMOTOR EXAM:  Ocular Alignment: normal  Ocular ROM: No Limitations  Spontaneous Nystagmus: absent  Gaze-Induced Nystagmus: absent  Smooth Pursuits: intact  Saccades: intact  Convergence/Divergence: 0 cm   Cover-cross-cover test: NT   VESTIBULAR - OCULAR REFLEX:   Slow VOR: Comment: 2/10 with horizontal, 3/10 with vertical   VOR Cancellation: Comment: increased symtpoms  Head-Impulse Test: HIT Right: negative but feels dizzy HIT Left: negative  Dynamic Visual Acuity: NT   POSITIONAL TESTING: Right Dix-Hallpike: no nystagmus and dizzy upon coming up to sit Left Dix-Hallpike: no nystagmus and dizzy upon coming up to sit Right Roll Test: no nystagmus Left Roll Test: no nystagmus  Vitals:  105/77, HR 84 (sitting)  MOTION SENSITIVITY: *Reports sitting to standing is the worst*  Motion Sensitivity Quotient Intensity: 0 = none, 1 = Lightheaded, 2 = Mild, 3 = Moderate, 4 = Severe, 5 = Vomiting  Intensity  1. Sitting to supine 0  2. Supine to L side 0  3. Supine to R side 0  4. Supine to sitting 1  5. L Hallpike-Dix 0  6. Up from L  1  7. R Hallpike-Dix 0  8. Up from R  1  9. Sitting, head tipped to L knee 0  10. Head up from L knee 1  11. Sitting, head tipped to R knee 0  12. Head up from R knee 2  13. Sitting head turns x5 1  14.Sitting head nods x5 1-2  15. In stance, 180 turn to L  NT  16. In stance, 180 turn to  R NT    OTHOSTATICS: not done                                                                                                                               TREATMENT DATE: 12/05/2023      GOALS: Goals reviewed with patient? Yes  SHORT TERM GOALS: Target date: 01/06/2024  Pt will be independent with HEP for improved dizziness, decreased falls. Baseline: DHI 64, reports at least 2 falls/day Goal status: MET  2.  DHI score to improved to less than or equal to 46 for decreased dizziness impacting ADLs. Baseline: 64; 54 Goal status: NOT MET  3.  Pt will verbalize understanding of fall prevention in home environment.  Baseline:  2 falls per day (quick dizzy onset and legs give way); reports single fall during POC  Goal status:  MET   LONG TERM GOALS: Target date: 02/03/2024  Pt will be independent with HEP for improved dizziness, decreased falls. Baseline: reports 2  falls/day; reports one fall during POC Goal status: MET  2.  Pt will improve DHI score to less than or equal to 28 for decreased dizziness impacting ADLs. Baseline: 64; 54 Goal status: MET  3.  Pt will improve DGI score to at least 20/24 to decrease fall risk.  Baseline: 19/24; 21/24 Goal status: MET  4.  Pt will verbalize/demo ability to return to 15-30 minutes of aerobic activity to improve overall functional mobility. Baseline: no current HEP, stays in bed a good portion of day; 10 min recumbent bike Goal status: NOT MET  ASSESSMENT:  CLINICAL IMPRESSION: Reports having increased symptoms over past week with reduced activity tolerance as a result.  Denies any syncopal episodes. Standing balance activities performed with good control with exception of eyes closed on compliant surfaces causing LOB (condition 4 M-CTSIB) at 20 seconds.  DGI w/ score 21/24 indicating low risk for falls and pt endorses not having balance deficits with main issue being the unexpected orthostatic intolerance and no glaring  deficits noted.  Discussed tenets of exercise physiology and training as it pertains to her case and encouraged faithful performance to resistance training from supine to sitting to standing as tolerated and performing cardiovascular exercise (recumbent cycle) on the alternate days.  Verbalizes understanding to priniciples and reports confidence in self mgmt for her current state. Will follow up as needed       From Eval: Patient is a 33 y.o. female who was seen today for physical therapy evaluation and treatment for dizziness. She has hx of dizziness symptoms since 2017, but reports they have worsened in the past month.  She describes two types of dizziness:  1) spinning type dizziness where world spins, but she denies this is brought on by any specific motion or position; 2) quick onset dizziness that will cause her knees to buckle and fall.  She notes typically 2 falls per day.  She has hx of migraines, hx of motion sickness, tinnitus, work up for POTS, though she notes it will take some time before she can get into specialist for actual diagnosis.  With eval today, oculomotor testing is WFL; VOR and VOR cancellation brings on symptoms (this is consistent with hx of motion sickness).  HIT is negative bilaterally, but R brings on symptoms.  Positional testing for BPPV is negative today, but she has increased symptoms with coming up from supine from L and R DH positions.  Other motions on MSQ that increase symptoms are seated nose to knee and seated head motions; did not try 180 turns in standing, as pt's symptoms were to the level of nausea by end of session.  She appears to have some motion sensitivity today and symptoms brought on by VOR/VOR cancellation.  DHI score of 64% indicates severe handicap from dizziness.  Further balance testing may be warranted to look at vestibular system use for balance; did not pursue the autonomic dizziness today, other than to educate on slowed transitions and general lower  extremity movements for gentle exercise.     OBJECTIVE IMPAIRMENTS: Abnormal gait, decreased activity tolerance, decreased balance, decreased mobility, difficulty walking, dizziness, and postural dysfunction.   ACTIVITY LIMITATIONS: bending, squatting, reach over head, and locomotion level  PARTICIPATION LIMITATIONS: meal prep, cleaning, driving, shopping, community activity, and school  PERSONAL FACTORS: 3+ comorbidities: see above are also affecting patient's functional outcome.   REHAB POTENTIAL: Good  CLINICAL DECISION MAKING: Stable/uncomplicated  EVALUATION COMPLEXITY: Low   PLAN:  PT FREQUENCY: 1x/week  PT DURATION: 8  weeks plus eval  PLANNED INTERVENTIONS: 97750- Physical Performance Testing, 97110-Therapeutic exercises, 97530- Therapeutic activity, [id number]- Neuromuscular re-education, 97535- Self Care, 02859- Manual therapy, [id number]- Gait training, [id number]- Canalith repositioning, Patient/Family education, Balance training, and Vestibular training  PLAN FOR NEXT SESSION: Review updates to HEP; gentle aerobic activity for exercise tolerance and standing/gait activities she may increase at home   5:10 PM, 01/10/2024 M. Kelly Sharonlee Nine, PT, DPT Physical Therapist- [place] Office Number: [phone]    "

## 2024-01-10 NOTE — Patient Instructions (Addendum)
 Thank you for choosing [facility]!     Medication Instructions:   OK to use salt tablets to achieve ~4-5 gm sodium per day. Start with 1 tablet of 1gm sodium for one week daily and increase to 2 tabs (2gm) daily for one week, then 3 tabs daily.  Continue to use your normal diet and liquid intake to get plenty of water and salt intake daily.    Continue your PT efforts!    *If you need a refill on your cardiac medications before your next appointment, please call your pharmacy*   Lab Work: No labs were ordered during today's visit.  If you have labs (blood work) drawn today and your tests are completely normal, you will receive your results only by: MyChart Message (if you have MyChart) OR A paper copy in the mail If you have any lab test that is abnormal or we need to change your treatment, we will call you to review the results.   Testing/Procedures: No procedures were ordered during today's visit.   Your next appointment:   2 month(s)   Provider:   Daphne Barrack     Follow-Up: At [facility], you and your health needs are our priority.  As part of our continuing mission to provide you with exceptional heart care, we have created designated Provider Care Teams.  These Care Teams include your primary Cardiologist (physician) and Advanced Practice Providers (APPs -  Physician Assistants and Nurse Practitioners) who all work together to provide you with the care you need, when you need it. We recommend signing up for the patient portal called MyChart.  Sign up information is provided on this After Visit Summary.  MyChart is used to connect with patients for Virtual Visits (Telemedicine).  Patients are able to view lab/test results, encounter notes, upcoming appointments, etc.  Non-urgent messages can be sent to your provider as well.   To learn more about what you can do with MyChart, go to [email or web address].

## 2024-01-30 ENCOUNTER — Other Ambulatory Visit (HOSPITAL_BASED_OUTPATIENT_CLINIC_OR_DEPARTMENT_OTHER): Payer: Self-pay

## 2024-02-01 ENCOUNTER — Ambulatory Visit: Admitting: Dermatology

## 2024-02-01 ENCOUNTER — Other Ambulatory Visit (HOSPITAL_BASED_OUTPATIENT_CLINIC_OR_DEPARTMENT_OTHER): Payer: Self-pay

## 2024-02-01 ENCOUNTER — Encounter: Payer: Self-pay | Admitting: Dermatology

## 2024-02-01 ENCOUNTER — Other Ambulatory Visit: Payer: Self-pay

## 2024-02-01 VITALS — BP 110/70

## 2024-02-01 DIAGNOSIS — R238 Other skin changes: Secondary | ICD-10-CM

## 2024-02-01 DIAGNOSIS — L700 Acne vulgaris: Secondary | ICD-10-CM | POA: Diagnosis not present

## 2024-02-01 DIAGNOSIS — L649 Androgenic alopecia, unspecified: Secondary | ICD-10-CM | POA: Diagnosis not present

## 2024-02-01 MED ORDER — PAROXETINE HCL 40 MG PO TABS
40.0000 mg | ORAL_TABLET | Freq: Every day | ORAL | 0 refills | Status: AC
  Filled 2024-02-01: qty 90, 90d supply, fill #0

## 2024-02-01 MED ORDER — TRETINOIN 0.025 % EX CREA
TOPICAL_CREAM | CUTANEOUS | 1 refills | Status: AC
  Filled 2024-02-01: qty 45, 15d supply, fill #0

## 2024-02-01 NOTE — Progress Notes (Signed)
"  ° °  Follow-Up Visit   Subjective  [name] is a 34 y.o. female established patient who presents for FOLLOW UP on the diagnoses listed below:  Patient was last evaluated on 09/20/23.    AGA: Pt stated that she has only been taking Minoxidil  1.5mg  nightly. She did not purchase Vital Protein Collagen as she stated that she was not told to purchase it. Pt stated that she did not have any side effects of dizziness or lightheadedness. Pt stated that she does not see any improvement in growth.    Are you nursing, pregnant or trying to conceive? No   The following portions of the chart were reviewed this encounter and updated as appropriate: medications, allergies, medical history  Review of Systems:  No other skin or systemic complaints except as noted in HPI or Assessment and Plan.  Objective  Well appearing patient in no apparent distress; mood and affect are within normal limits.   A focused examination was performed of the following areas: scalp   Relevant exam findings are noted in the Assessment and Plan.               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 Unwanted hair growth on arms, feet, ankles, and chin. Full results expected in a year to a year and a half. Increasing minoxidil  dose could accelerate unwanted hair growth elsewhere.  - Continue current dose of minoxidil . - Consider collagen supplements for skin, hair, and nails. - Consider Nutrafol or Viviscal supplements if desired.  Acne vulgaris Oily skin and acne breakouts. Hormonal influence on hair thinning and acne. No current acne treatment. Tretinoin  recommended to accelerate skin cell turnover and reduce acne. Potential drying effect of tretinoin  discussed.  - Start tretinoin  0.025% twice a week, increasing to three times a week in March. - Use Vanicream as a gentle cleanser. - Provided samples of recommended  cleansers.  Localized skin irritation of thigh Recent onset of scaly, dry, crusty lesion on thigh. No history of similar lesions. Appears to be an irritation or abrasion, not dangerous.  - Apply Aquaphor ointment to the affected area. ACNE VULGARIS   This Visit - tretinoin  (RETIN-A ) 0.025 % cream - apply 1 pea size (1gm) amount to the face 2-3 nights a week  Return in 9 months (on 10/31/2024) for ANDROGENETIC ALOPECIA.   Documentation: I have reviewed the above documentation for accuracy and completeness, and I agree with the above.  I, Shirron Maranda, CMA II, am acting as scribe for:  Delon Lenis, DO "

## 2024-02-01 NOTE — Patient Instructions (Addendum)
 VISIT SUMMARY:  During your visit, we discussed your concerns about hair regrowth, unwanted hair growth, acne breakouts, and a new lesion on your thigh. We reviewed your current treatments and provided recommendations to address each issue.  YOUR PLAN:  -ANDROGENIC ALOPECIA:  Androgenic alopecia is a common form of hair loss in both men and women. You have started to see early regrowth on your scalp after four months of using minoxidil . However, you have also experienced unwanted hair growth on your arms, feet, ankles, and chin.   We recommend continuing your current dose of minoxidil  (1/2 tablet daily), as increasing the dose could lead to more unwanted hair growth. You may also consider taking collagen supplements for your skin, hair, and nails, or trying Nutrafol or Viviscal supplements if you wish.  -ACNE VULGARIS:  Acne vulgaris is a common skin condition that causes pimples and oily skin. Your acne breakouts and oily skin may be influenced by hormonal factors.   We recommend starting tretinoin  0.025% twice a week, and then increasing to three times a week in March. Tretinoin  helps to accelerate skin cell turnover and reduce acne, but it may cause some dryness. Use   Vanicream as a gentle cleanser, and we have provided samples of recommended cleansers for you to try.  -LOCALIZED SKIN IRRITATION OF THIGH:  You have a new scaly, dry, and crusty lesion on your thigh that appeared a couple of days ago. It seems to be an irritation or abrasion and is not dangerous.  We recommend applying Aquaphor ointment to the affected area to help it heal.  INSTRUCTIONS:  Please follow up with us  if you have any concerns or if your symptoms do not improve. Continue with the recommended treatments and let us  know if you experience any side effects or new symptoms.            Important Information  Due to recent changes in healthcare laws, you may see results of your pathology and/or laboratory  studies on MyChart before the doctors have had a chance to review them. We understand that in some cases there may be results that are confusing or concerning to you. Please understand that not all results are received at the same time and often the doctors may need to interpret multiple results in order to provide you with the best plan of care or course of treatment. Therefore, we ask that you please give us  2 business days to thoroughly review all your results before contacting the office for clarification. Should we see a critical lab result, you will be contacted sooner.   If You Need Anything After Your Visit  If you have any questions or concerns for your doctor, please call our main line at [phone] If no one answers, please leave a voicemail as directed and we will return your call as soon as possible. Messages left after 4 pm will be answered the following business day.   You may also send us  a message via MyChart. We typically respond to MyChart messages within 1-2 business days.  For prescription refills, please ask your pharmacy to contact our office. Our fax number is [phone].  If you have an urgent issue when the clinic is closed that cannot wait until the next business day, you can page your doctor at the number below.    Please note that while we do our best to be available for urgent issues outside of office hours, we are not available 24/7.   If you have  an urgent issue and are unable to reach us , you may choose to seek medical care at your doctor's office, retail clinic, urgent care center, or emergency room.  If you have a medical emergency, please immediately call 911 or go to the emergency department. In the event of inclement weather, please call our main line at [phone] for an update on the status of any delays or closures.  Dermatology Medication Tips: Please keep the boxes that topical medications come in in order to help keep track of the instructions  about where and how to use these. Pharmacies typically print the medication instructions only on the boxes and not directly on the medication tubes.   If your medication is too expensive, please contact our office at [phone] or send us  a message through MyChart.   We are unable to tell what your co-pay for medications will be in advance as this is different depending on your insurance coverage. However, we may be able to find a substitute medication at lower cost or fill out paperwork to get insurance to cover a needed medication.   If a prior authorization is required to get your medication covered by your insurance company, please allow us  1-2 business days to complete this process.  Drug prices often vary depending on where the prescription is filled and some pharmacies may offer cheaper prices.  The website www.goodrx.com contains coupons for medications through different pharmacies. The prices here do not account for what the cost may be with help from insurance (it may be cheaper with your insurance), but the website can give you the price if you did not use any insurance.  - You can print the associated coupon and take it with your prescription to the pharmacy.  - You may also stop by our office during regular business hours and pick up a GoodRx coupon card.  - If you need your prescription sent electronically to a different pharmacy, notify our office through [facility] or by phone at [phone]

## 2024-02-02 ENCOUNTER — Other Ambulatory Visit (HOSPITAL_BASED_OUTPATIENT_CLINIC_OR_DEPARTMENT_OTHER): Payer: Self-pay

## 2024-02-03 ENCOUNTER — Other Ambulatory Visit (HOSPITAL_BASED_OUTPATIENT_CLINIC_OR_DEPARTMENT_OTHER): Payer: Self-pay

## 2024-02-07 NOTE — Progress Notes (Unsigned)
 "  Virtual Visit via Video Note  I connected with [name] on 02/08/24 at  3:15 PM EST by a video enabled telemedicine application and verified that I am speaking with the correct person using two identifiers.  Location: Patient: at her home Provider: in the office    I discussed the limitations of evaluation and management by telemedicine and the availability of in person appointments. The patient expressed understanding and agreed to proceed.  History of Present Illness: Update 02/08/24 SS: Here for CPAP. Lately usage has been less. Stopped her trazodone , felt causing side effects, thus not sleeping well. Feeling tired during the day, can't stay asleep. Melatonin isn't working. She wakes up with her throat sore from snoring. Trying to use CPAP, but only sleeping 1 hour, pulling mask off.  Latest report shows suboptimal use, report attached.   Update June 15, 2023 SS: Here for cpap revisit. Has not used CPAP in the last few months due to medication changes, illness. Has been feeling fatigued, ready to resume CPAP. Has FFM to use.  Last visit in November she was going to try new CPAP mask, her AHI was 5.4 (central 4.3).   Update December 01, 2022 SS: Saw Dr. Buck in August 2024 reporting snoring and excessive daytime somnolence.  Had HST 09/22/2022 showing moderate OSA total AHI 20.1/hour and O2 nadir of 85%.  Recommended starting CPAP. Started using FFM, it was leaking, got stretched out. Now using nasal mask Air 20, will try tonight. Has not noted much improvement since starting CPAP. Setup date was 10/21/22. Found out several Vitamin D  deficiency recently.GI issues, just got all clear for C.Diff. Having colonoscopy coming up.  CPAP data is attached, 93% usage, greater than 4 hours 70%.  6 to 12 cm water.  Leak 9.3.  AHI 5.4.  09/06/22 Dr. Buck: I saw your patient, [name], upon your kind request in my sleep clinic today for evaluation of her sleep disorder, in particular, concern for  underlying obstructive sleep apnea.  The patient is unaccompanied today.  As you know, Ms. Kovack is a 34 year old female with an underlying medical history of ADHD, anxiety, depression, allergies, reflux disease, history of syncope and TIA (for which she saw my colleague, Dr. Rosemarie), currently on treatment for C. difficile colitis, and obesity, who reports snoring and excessive daytime somnolence.  Her Epworth sleepiness score is 4 out of 24, fatigue severity score is 53 out of 63. I reviewed your office note from 07/20/2022.  She has felt tired during the day for years.  She currently does not work, she is a consulting civil engineer, chief executive officer.  Bedtime varies, typically between 2 and 4 AM and rise time about 12 hours later.  She is a non-smoker, drinks alcohol rarely, drinks occasional caffeine, no daily caffeine consumption.  Her mom has sleep apnea.  She lives with her mother and brother.  She has no nightly nocturia and denies recurrent morning headaches.  She does not have any pets in the household.  She does not have a TV on in her bedroom at night.     Observations/Objective: Via video visit  Assessment and Plan: 1.  OSA on CPAP  -Recommend resume CPAP use nightly minimum 4 hours.  Continue current settings.  When she resumes CPAP, she can reach out to me and I will be happy to review the data.  We reviewed her prior HST in September 2024 showed moderate sleep apnea with AHI 20.1/hour.  She knows she  needs CPAP, is motivated to resume  Follow Up Instructions: 1 year virtually  I discussed the assessment and treatment plan with the patient. The patient was provided an opportunity to ask questions and all were answered. The patient agreed with the plan and demonstrated an understanding of the instructions.   The patient was advised to call back or seek an in-person evaluation if the symptoms worsen or if the condition fails to improve as anticipated.  Lauraine Gayland MANDES, DNP  [facility] [address] [place], [place] [address] [phone]  "

## 2024-02-07 NOTE — Progress Notes (Unsigned)
 SABRA

## 2024-02-08 ENCOUNTER — Telehealth: Admitting: Neurology

## 2024-02-08 DIAGNOSIS — G4733 Obstructive sleep apnea (adult) (pediatric): Secondary | ICD-10-CM | POA: Diagnosis not present

## 2024-02-08 NOTE — Patient Instructions (Addendum)
 Great to see you today! Recommend resume CPAP nightly minimum 4 hours Once restarting CPAP reach out to me, we will be happy to review the data

## 2024-03-13 ENCOUNTER — Ambulatory Visit: Admitting: Pulmonary Disease

## 2025-02-20 ENCOUNTER — Telehealth: Admitting: Neurology
